# Patient Record
Sex: Female | Born: 1950 | Race: White | Hispanic: No | Smoking: Never smoker
Health system: Southern US, Community
[De-identification: ages and names within clinical notes are randomized; demographics above are authoritative.]

## PROBLEM LIST (undated history)

## (undated) DIAGNOSIS — T7840XA Allergy, unspecified, initial encounter: Secondary | ICD-10-CM

## (undated) DIAGNOSIS — E119 Type 2 diabetes mellitus without complications: Secondary | ICD-10-CM

## (undated) DIAGNOSIS — K219 Gastro-esophageal reflux disease without esophagitis: Secondary | ICD-10-CM

## (undated) DIAGNOSIS — F329 Major depressive disorder, single episode, unspecified: Secondary | ICD-10-CM

## (undated) DIAGNOSIS — G4730 Sleep apnea, unspecified: Secondary | ICD-10-CM

## (undated) DIAGNOSIS — E785 Hyperlipidemia, unspecified: Secondary | ICD-10-CM

## (undated) DIAGNOSIS — I10 Essential (primary) hypertension: Secondary | ICD-10-CM

## (undated) DIAGNOSIS — M1990 Unspecified osteoarthritis, unspecified site: Secondary | ICD-10-CM

## (undated) DIAGNOSIS — F32A Depression, unspecified: Secondary | ICD-10-CM

## (undated) DIAGNOSIS — Z86010 Personal history of colonic polyps: Secondary | ICD-10-CM

## (undated) HISTORY — PX: ABDOMINAL HYSTERECTOMY: SHX81

## (undated) HISTORY — DX: Essential (primary) hypertension: I10

## (undated) HISTORY — PX: COLONOSCOPY: SHX174

## (undated) HISTORY — DX: Personal history of colonic polyps: Z86.010

## (undated) HISTORY — PX: OTHER SURGICAL HISTORY: SHX169

## (undated) HISTORY — DX: Gastro-esophageal reflux disease without esophagitis: K21.9

## (undated) HISTORY — PX: TUBAL LIGATION: SHX77

## (undated) HISTORY — DX: Allergy, unspecified, initial encounter: T78.40XA

## (undated) HISTORY — DX: Depression, unspecified: F32.A

## (undated) HISTORY — DX: Type 2 diabetes mellitus without complications: E11.9

## (undated) HISTORY — DX: Unspecified osteoarthritis, unspecified site: M19.90

## (undated) HISTORY — DX: Hyperlipidemia, unspecified: E78.5

## (undated) HISTORY — DX: Sleep apnea, unspecified: G47.30

## (undated) HISTORY — PX: POLYPECTOMY: SHX149

## (undated) HISTORY — DX: Major depressive disorder, single episode, unspecified: F32.9

---

## 2005-04-18 ENCOUNTER — Ambulatory Visit: Payer: Self-pay | Admitting: Internal Medicine

## 2005-05-02 ENCOUNTER — Ambulatory Visit: Payer: Self-pay | Admitting: Internal Medicine

## 2007-11-09 ENCOUNTER — Ambulatory Visit: Payer: Self-pay | Admitting: Family Medicine

## 2010-04-18 ENCOUNTER — Encounter (INDEPENDENT_AMBULATORY_CARE_PROVIDER_SITE_OTHER): Payer: Self-pay | Admitting: *Deleted

## 2010-04-19 ENCOUNTER — Ambulatory Visit: Payer: Self-pay | Admitting: Internal Medicine

## 2010-05-03 ENCOUNTER — Ambulatory Visit: Payer: Self-pay | Admitting: Internal Medicine

## 2010-05-16 ENCOUNTER — Encounter: Payer: Self-pay | Admitting: Internal Medicine

## 2010-05-24 ENCOUNTER — Encounter: Payer: Self-pay | Admitting: Internal Medicine

## 2011-01-22 NOTE — Procedures (Signed)
Summary: Colonoscopy  Patient: Barbara Fitzpatrick Note: All result statuses are Final unless otherwise noted.  Tests: (1) Colonoscopy (COL)   COL Colonoscopy           DONE     [facility]     [address]     [place], [place]  [address]           COLONOSCOPY PROCEDURE REPORT           PATIENT:  Barbara, Fitzpatrick  MR#:  [id number]     BIRTHDATE:  [birth year], 58 yrs. old  GENDER:  female     ENDOSCOPIST:  Iva Boop, MD, [facility]           PROCEDURE DATE:  05/03/2010     PROCEDURE:  Colonoscopy with snare polypectomy     ASA CLASS:  Class II     INDICATIONS:  surveillance and high-risk screening, history of     pre-cancerous (adenomatous) colon polyps 4 adenomas (max 1 cm)     04/2003     no polyps 04/2005     MEDICATIONS:   Fentanyl 50 mcg IV, Versed 7 mg           DESCRIPTION OF PROCEDURE:   After the risks benefits and     alternatives of the procedure were thoroughly explained, informed     consent was obtained.  Digital rectal exam was performed and     revealed no rectal masses.   The LB CF-[id number] endoscope     was introduced through the anus and advanced to the cecum, which     was identified by both the appendix and ileocecal valve, without     limitations.  The quality of the prep was good, using MoviPrep.     The instrument was then slowly withdrawn as the colon was fully     examined. Insertion: 4:00 mins withdrawal: 17:39 mins     <<PROCEDUREIMAGES>>           FINDINGS:  Three polyps were found. ascending (5mm), splenic     flexure (7-58mm), rectum (3mm). Smaller polyps were snared without     cautery. Retrieval was successful. The 7-32mm polyp was snared,     then cauterized with monopolar cautery. Retrieval was successful.     This polyp was initially cold snared then tip cautery applied to     coagulate bleeeding.  Mild diverticulosis was found in the sigmoid     colon.  This was otherwise a normal examination of the colon.     Retroflexed views in  the rectum revealed internal and external     hemorrhoids.    The scope was then withdrawn from the patient and     the procedure completed.           COMPLICATIONS:  None     ENDOSCOPIC IMPRESSION:     1) Three polyps removed (max size 7-32mm)     2) Mild diverticulosis in the sigmoid colon     3) Otherwise normal examination ofthe colon, good prep     4) Internal and external hemorrhoids     5) Prior adenomas (4, max size 1cm) 04/2003           RECOMMENDATIONS:     1) No aspirin or NSAID's for 2 weeks     2) She is describing what sounds like prolapsing hemorrhoids.     Will make a surgical referral from our office.  REPEAT EXAM:  In for Colonoscopy, pending biopsy results.           Iva Boop, MD, [facility]           CC:  Vonita Moss, MD and The Patient           n.     eSIGNED:   Iva Boop at 05/03/2010 10:55 AM           [facility], [phone]  Note: An exclamation mark (!) indicates a result that was not dispersed into the flowsheet. Document Creation Date: 05/03/2010 10:55 AM _______________________________________________________________________  (1) Order result status: Final Collection or observation date-time: 05/03/2010 10:44 Requested date-time:  Receipt date-time:  Reported date-time:  Referring Physician:   Ordering Physician: Stan Head [id number]) Specimen Source:  Source: Launa Grill Order Number: [id number] Lab site:   Appended Document: Colonoscopy Referral information sent to [facility].  I will await a return call .     I have left a message for the patient with the details of appointment at [facility] with Dr Zachery Dakins 05/16/10 3:10 arrival for 3:40 appointment Darcey Nora RN, [address] [facility]  May 04, 2010 8:20 AM     Clinical Lists Changes  Problems: Added new problem of HEMORRHOIDS, INTERNAL (ICD-455.0) - Signed Orders: Added new Test order of [facility] ([facility]) - Signed      Appended Document:  Colonoscopy     Procedures Next Due Date:    Colonoscopy: 04/2013

## 2011-01-22 NOTE — Letter (Signed)
Summary: Patient Notice- Polyp Results  [facility]  [address] [place], [place] [address]   Phone: [phone]  Fax: [phone]        May 24, 2010 MRN: [id number]    [facility] [address] [place], [place]  [address]    Dear Ms. Reny,  The polyps removed from your colon were adenomatous. This means that they were pre-cancerous or that  they had the potential to change into cancer over time.   I recommend that you have a repeat colonoscopy in 3 years to determine if you have developed any new polyps over time. If you develop any new rectal bleeding, abdominal pain or significant bowel habit changes, please contact us before then.  Please call us if you are having persistent problems or have questions about your condition that have not been fully answered at this time.   Sincerely,  Iva Boop MD, [facility]  This letter has been electronically signed by your physician.  Appended Document: Patient Notice- Polyp Results letter mailed.

## 2011-01-22 NOTE — Miscellaneous (Signed)
Summary: Lec previsit  Clinical Lists Changes  Medications: Added new medication of MOVIPREP 100 GM  SOLR (PEG-KCL-NACL-NASULF-NA ASC-C) As per prep instructions. - Signed Rx of MOVIPREP 100 GM  SOLR (PEG-KCL-NACL-NASULF-NA ASC-C) As per prep instructions.;  #1 x 0;  Signed;  Entered by: Karl Bales RN;  Authorized by: Iva Boop MD, [facility];  Method used: Electronically to [facility] [phone]*, [address] [place], [place]  [address], Ph: [phone], Fax: [phone] Observations: Added new observation of NKA: T (04/19/2010 16:03)    Prescriptions: MOVIPREP 100 GM  SOLR (PEG-KCL-NACL-NASULF-NA ASC-C) As per prep instructions.  #1 x 0   Entered by:   Karl Bales RN   Authorized by:   Iva Boop MD, [facility]   Signed by:   Karl Bales RN on 04/19/2010   Method used:   Electronically to        [facility] [phone]* (retail)       [address] [place], [place]  [address]       Ph: [phone]       Fax: [phone]   RxID:   [id number]

## 2011-01-22 NOTE — Letter (Signed)
Summary: [facility] Instructions  [facility]  [address] [place], [place] [address]   Phone: [phone]  Fax: [phone]       Barbara Fitzpatrick    1951/12/21    MRN: [id number]        Procedure Day /Date: Thursday 05/03/2010     Arrival Time: 9:00 am      Procedure Time: 10:00 am     Location of Procedure:                    _x _  [facility] (4th Floor)                        PREPARATION FOR COLONOSCOPY WITH MOVIPREP   Starting 5 days prior to your procedure Saturday 5/7 do not eat nuts, seeds, popcorn, corn, beans, peas,  salads, or any raw vegetables.  Do not take any fiber supplements (e.g. Metamucil, Citrucel, and Benefiber).  THE DAY BEFORE YOUR PROCEDURE         DATE: Wednesday 5/11  1.  Drink clear liquids the entire day-NO SOLID FOOD  2.  Do not drink anything colored red or purple.  Avoid juices with pulp.  No orange juice.  3.  Drink at least 64 oz. (8 glasses) of fluid/clear liquids during the day to prevent dehydration and help the prep work efficiently.  CLEAR LIQUIDS INCLUDE: Water Jello Ice Popsicles Tea (sugar ok, no milk/cream) Powdered fruit flavored drinks Coffee (sugar ok, no milk/cream) Gatorade Juice: apple, white grape, white cranberry  Lemonade Clear bullion, consomm, broth Carbonated beverages (any kind) Strained chicken noodle soup Hard Candy                             4.  In the morning, mix first dose of MoviPrep solution:    Empty 1 Pouch A and 1 Pouch B into the disposable container    Add lukewarm drinking water to the top line of the container. Mix to dissolve    Refrigerate (mixed solution should be used within 24 hrs)  5.  Begin drinking the prep at 5:00 p.m. The MoviPrep container is divided by 4 marks.   Every 15 minutes drink the solution down to the next mark (approximately 8 oz) until the full liter is complete.   6.  Follow completed prep with 16 oz of clear liquid of your choice  (Nothing red or purple).  Continue to drink clear liquids until bedtime.  7.  Before going to bed, mix second dose of MoviPrep solution:    Empty 1 Pouch A and 1 Pouch B into the disposable container    Add lukewarm drinking water to the top line of the container. Mix to dissolve    Refrigerate  THE DAY OF YOUR PROCEDURE      DATE: Thursday 5/12  Beginning at 5:00 a.m. (5 hours before procedure):         1. Every 15 minutes, drink the solution down to the next mark (approx 8 oz) until the full liter is complete.  2. Follow completed prep with 16 oz. of clear liquid of your choice.    3. You may drink clear liquids until 8:00 am (2 HOURS BEFORE PROCEDURE).   MEDICATION INSTRUCTIONS  Unless otherwise instructed, you should take regular prescription medications with a small sip of water   as early as possible the morning of your  procedure.  Hold Hydrochlorothiazide the day of your procedure only (before coming in for procedure)         OTHER INSTRUCTIONS  You will need a responsible adult at least 59 years of age to accompany you and drive you home.   This person must remain in the waiting room during your procedure.  Wear loose fitting clothing that is easily removed.  Leave jewelry and other valuables at home.  However, you may wish to bring a book to read or  an iPod/MP3 player to listen to music as you wait for your procedure to start.  Remove all body piercing jewelry and leave at home.  Total time from sign-in until discharge is approximately 2-3 hours.  You should go home directly after your procedure and rest.  You can resume normal activities the  day after your procedure.  The day of your procedure you should not:   Drive   Make legal decisions   Operate machinery   Drink alcohol   Return to work  You will receive specific instructions about eating, activities and medications before you leave.    The above instructions have been reviewed and  explained to me by   Karl Bales RN  April 19, 2010 4:29 PM    I fully understand and can verbalize these instructions _____________________________ Date _________

## 2011-01-22 NOTE — Letter (Signed)
Summary: [facility]   Imported By: Lester [place] 06/06/2010 09:16:15  _____________________________________________________________________  External Attachment:    Type:   Image     Comment:   External Document

## 2012-12-02 ENCOUNTER — Ambulatory Visit: Payer: Self-pay

## 2013-06-16 ENCOUNTER — Encounter: Payer: Self-pay | Admitting: Internal Medicine

## 2013-06-16 DIAGNOSIS — Z860100 Personal history of colon polyps, unspecified: Secondary | ICD-10-CM | POA: Insufficient documentation

## 2013-06-16 HISTORY — DX: Personal history of colonic polyps: Z86.010

## 2013-06-16 HISTORY — DX: Personal history of colon polyps, unspecified: Z86.0100

## 2013-06-17 ENCOUNTER — Encounter: Payer: Self-pay | Admitting: Internal Medicine

## 2013-07-07 ENCOUNTER — Encounter: Payer: Self-pay | Admitting: Internal Medicine

## 2013-09-10 ENCOUNTER — Ambulatory Visit (AMBULATORY_SURGERY_CENTER): Payer: BC Managed Care – PPO | Admitting: *Deleted

## 2013-09-10 VITALS — Ht 64.5 in | Wt 204.8 lb

## 2013-09-10 DIAGNOSIS — Z86010 Personal history of colonic polyps: Secondary | ICD-10-CM

## 2013-09-10 MED ORDER — NA SULFATE-K SULFATE-MG SULF 17.5-3.13-1.6 GM/177ML PO SOLN: ORAL | Status: DC

## 2013-09-10 NOTE — Progress Notes (Signed)
No soy or egg allergy 

## 2013-09-13 ENCOUNTER — Encounter: Payer: Self-pay | Admitting: Internal Medicine

## 2013-09-24 ENCOUNTER — Ambulatory Visit (AMBULATORY_SURGERY_CENTER): Payer: BC Managed Care – PPO | Admitting: Internal Medicine

## 2013-09-24 ENCOUNTER — Encounter: Payer: Self-pay | Admitting: Internal Medicine

## 2013-09-24 VITALS — BP 117/73 | HR 61 | Temp 97.4°F | Resp 15 | Ht 64.5 in | Wt 204.0 lb

## 2013-09-24 DIAGNOSIS — K5730 Diverticulosis of large intestine without perforation or abscess without bleeding: Secondary | ICD-10-CM

## 2013-09-24 DIAGNOSIS — K648 Other hemorrhoids: Secondary | ICD-10-CM

## 2013-09-24 DIAGNOSIS — Z86010 Personal history of colonic polyps: Secondary | ICD-10-CM

## 2013-09-24 DIAGNOSIS — D126 Benign neoplasm of colon, unspecified: Secondary | ICD-10-CM

## 2013-09-24 MED ORDER — SODIUM CHLORIDE 0.9 % IV SOLN: 500.0000 mL | INTRAVENOUS | Status: DC

## 2013-09-24 NOTE — Op Note (Signed)
[facility] [address] [place], [address]   COLONOSCOPY PROCEDURE REPORT  PATIENT: Barbara, Fitzpatrick  MR#: [id number] BIRTHDATE: [birth year] , 62  yrs. old GENDER: Female ENDOSCOPIST: Iva Boop, MD, [facility] PROCEDURE DATE:  09/24/2013 PROCEDURE:   Colonoscopy with biopsy and snare polypectomy First Screening Colonoscopy - Avg.  risk and is 50 yrs.  old or older - No.  Prior Negative Screening - Now for repeat screening. N/A  History of Adenoma - Now for follow-up colonoscopy & has been > or = to 3 yrs.  Yes hx of adenoma.  Has been 3 or more years since last colonoscopy.  Polyps Removed Today? Yes. ASA CLASS:   Class II INDICATIONS:Patient's personal history of adenomatous colon polyps.  MEDICATIONS: propofol (Diprivan) 350mg  IV, MAC sedation, administered by CRNA, and These medications were titrated to patient response per physician's verbal order  DESCRIPTION OF PROCEDURE:   After the risks benefits and alternatives of the procedure were thoroughly explained, informed consent was obtained.  A digital rectal exam revealed no abnormalities of the rectum.   The LB PFC-H190 [id number]  endoscope was introduced through the anus and advanced to the cecum, which was identified by both the appendix and ileocecal valve. No adverse events experienced.   The quality of the prep was Suprep adequate The instrument was then slowly withdrawn as the colon was fully examined.    COLON FINDINGS: Three sessile polyps measuring 2-8 mm in size were found in the ascending colon and at the hepatic flexure.  A polypectomy was performed with cold forceps (2mm polyp) and with a cold snare (other 2).  The resection was complete and the polyp tissue was completely retrieved.   Mild diverticulosis was noted in the sigmoid colon.   The colon mucosa was otherwise normal. Retroflexed views revealed internal hemorrhoids. The time to cecum=2 minutes 54 seconds.  Withdrawal time=16 minutes  37 seconds. The scope was withdrawn and the procedure completed. COMPLICATIONS: There were no complications.  ENDOSCOPIC IMPRESSION: 1.   Three sessile polyps measuring 2-8 mm in size were found in the ascending colon and at the hepatic flexure; polypectomy was performed with cold forceps and with a cold snare 2.   Mild diverticulosis was noted in the sigmoid colon and internal hemorrhoids in the rectum 3.   The colon mucosa was otherwise normal - adequate pre - patient with prior adenomas last in 2011  RECOMMENDATIONS: Timing of repeat colonoscopy will be determined by pathology findings.   eSigned:  Iva Boop, MD, [facility] 09/24/2013 11:32 AM   cc: The Patient  and Charlean Sanfilippo

## 2013-09-24 NOTE — Patient Instructions (Addendum)
I found and removed 3 polyps today. As before they look benign but I suspect they are pre-cancerous. You also have internal hemorrhoids and mild diverticulosis.  I will let you know pathology results and when to have another routine colonoscopy by mail.  If you have hemorrhoid problems (swelling, itching, bleeding) I am able to treat those with an in-office procedure. If you like, please call my office at [phone] to schedule an appointment and I can evaluate you further.  I appreciate the opportunity to care for you. Iva Boop, MD, FACG  YOU HAD AN ENDOSCOPIC PROCEDURE TODAY AT THE [facility]: Refer to the procedure report that was given to you for any specific questions about what was found during the examination.  If the procedure report does not answer your questions, please call your gastroenterologist to clarify.  If you requested that your care partner not be given the details of your procedure findings, then the procedure report has been included in a sealed envelope for you to review at your convenience later.  YOU SHOULD EXPECT: Some feelings of bloating in the abdomen. Passage of more gas than usual.  Walking can help get rid of the air that was put into your GI tract during the procedure and reduce the bloating. If you had a lower endoscopy (such as a colonoscopy or flexible sigmoidoscopy) you may notice spotting of blood in your stool or on the toilet paper. If you underwent a bowel prep for your procedure, then you may not have a normal bowel movement for a few days.  DIET: Your first meal following the procedure should be a light meal and then it is ok to progress to your normal diet.  A half-sandwich or bowl of soup is an example of a good first meal.  Heavy or fried foods are harder to digest and may make you feel nauseous or bloated.  Likewise meals heavy in dairy and vegetables can cause extra gas to form and this can also increase the bloating.  Drink plenty of  fluids but you should avoid alcoholic beverages for 24 hours.  ACTIVITY: Your care partner should take you home directly after the procedure.  You should plan to take it easy, moving slowly for the rest of the day.  You can resume normal activity the day after the procedure however you should NOT DRIVE or use heavy machinery for 24 hours (because of the sedation medicines used during the test).    SYMPTOMS TO REPORT IMMEDIATELY: A gastroenterologist can be reached at any hour.  During normal business hours, 8:30 AM to 5:00 PM Monday through Friday, call [phone].  After hours and on weekends, please call the GI answering service at [phone] who will take a message and have the physician on call contact you.   Following lower endoscopy (colonoscopy or flexible sigmoidoscopy):  Excessive amounts of blood in the stool  Significant tenderness or worsening of abdominal pains  Swelling of the abdomen that is new, acute  Fever of 100F or higher  FOLLOW UP: If any biopsies were taken you will be contacted by phone or by letter within the next 1-3 weeks.  Call your gastroenterologist if you have not heard about the biopsies in 3 weeks.  Our staff will call the home number listed on your records the next business day following your procedure to check on you and address any questions or concerns that you may have at that time regarding the information given to you  following your procedure. This is a courtesy call and so if there is no answer at the home number and we have not heard from you through the emergency physician on call, we will assume that you have returned to your regular daily activities without incident.  SIGNATURES/CONFIDENTIALITY: You and/or your care partner have signed paperwork which will be entered into your electronic medical record.  These signatures attest to the fact that that the information above on your After Visit Summary has been reviewed and is understood.  Full  responsibility of the confidentiality of this discharge information lies with you and/or your care-partner.

## 2013-09-24 NOTE — Progress Notes (Signed)
Patient did not have preoperative order for IV antibiotic SSI prophylaxis. (G8918)  Patient did not experience any of the following events: a burn prior to discharge; a fall within the facility; wrong site/side/patient/procedure/implant event; or a hospital transfer or hospital admission upon discharge from the facility. (G8907)  

## 2013-09-24 NOTE — Progress Notes (Signed)
Report to pacu rn, vss, bbs=clear 

## 2013-09-24 NOTE — Progress Notes (Signed)
Called to room to assist during endoscopic procedure.  Patient ID and intended procedure confirmed with present staff. Received instructions for my participation in the procedure from the performing physician.  

## 2013-09-27 ENCOUNTER — Telehealth: Payer: Self-pay | Admitting: *Deleted

## 2013-09-27 NOTE — Telephone Encounter (Signed)
  Follow up Call-  Call back number 09/24/2013  Post procedure Call Back phone  # [phone] or [phone]   Permission to leave phone message Yes     Patient questions:  Do you have a fever, pain , or abdominal swelling? no Pain Score  0 *  Have you tolerated food without any problems? yes  Have you been able to return to your normal activities? yes  Do you have any questions about your discharge instructions: Diet   no Medications  no Follow up visit  no  Do you have questions or concerns about your Care? no  Actions: * If pain score is 4 or above: No action needed, pain <4.

## 2013-09-28 ENCOUNTER — Encounter: Payer: Self-pay | Admitting: Internal Medicine

## 2013-09-28 NOTE — Progress Notes (Signed)
Quick Note:  3 adenomas max 8 mm Repeat colonoscopy 3 years - 2017 ______

## 2014-03-30 ENCOUNTER — Ambulatory Visit: Payer: Self-pay

## 2015-04-03 ENCOUNTER — Ambulatory Visit
Admit: 2015-04-03 | Disposition: A | Discharge status: Home / Self Care | Payer: Self-pay | Attending: Unknown Physician Specialty | Admitting: Unknown Physician Specialty

## 2015-06-07 ENCOUNTER — Telehealth: Payer: Self-pay | Admitting: Unknown Physician Specialty

## 2015-06-07 MED ORDER — HYDROCHLOROTHIAZIDE 25 MG PO TABS: 25.0000 mg | ORAL_TABLET | Freq: Every day | ORAL | Status: DC

## 2015-06-07 NOTE — Telephone Encounter (Signed)
E-Fax came through for refill: Rx: hydrochlorothiazide (HYDRODIURIL) 25 MG tablet  Take 25 mg by mouth daily. Pharmacy: [facility]

## 2015-06-09 ENCOUNTER — Ambulatory Visit (INDEPENDENT_AMBULATORY_CARE_PROVIDER_SITE_OTHER): Payer: BC Managed Care – PPO | Admitting: Unknown Physician Specialty

## 2015-06-09 ENCOUNTER — Encounter: Payer: Self-pay | Admitting: Unknown Physician Specialty

## 2015-06-09 ENCOUNTER — Telehealth: Payer: Self-pay | Admitting: Unknown Physician Specialty

## 2015-06-09 VITALS — BP 143/86 | HR 97 | Temp 99.5°F | Ht 63.8 in | Wt 197.6 lb

## 2015-06-09 DIAGNOSIS — J069 Acute upper respiratory infection, unspecified: Secondary | ICD-10-CM | POA: Diagnosis not present

## 2015-06-09 NOTE — Progress Notes (Signed)
   BP 143/86 mmHg  Pulse 97  Temp(Src) 99.5 F (37.5 C)  Ht 5' 3.8" (1.621 m)  Wt 197 lb 9.6 oz (89.631 kg)  BMI 34.11 kg/m2  SpO2 97%  LMP  (LMP Unknown)   Subjective:    Patient ID: Barbara Fitzpatrick, female    DOB: August 12, 1951, 64 y.o.   MRN: [id number]  HPI: Barbara Fitzpatrick is a 64 y.o. female  Chief Complaint  Patient presents with  . Sore Throat    pt states mainly sore on right side  . Nasal Congestion  . Facial Pain   Sore Throat  Associated symptoms include congestion, headaches and swollen glands. Pertinent negatives include no abdominal pain, coughing, diarrhea or ear pain.  URI  This is a new problem. The current episode started yesterday. The problem has been unchanged. There has been no fever. Associated symptoms include congestion, headaches, rhinorrhea, sinus pain, a sore throat and swollen glands. Pertinent negatives include no abdominal pain, chest pain, coughing, diarrhea, dysuria, ear pain, joint pain, joint swelling or nausea. She has tried nothing for the symptoms.    Relevant past medical, surgical, family and social history reviewed and updated as indicated. Interim medical history since our last visit reviewed. Allergies and medications reviewed and updated.  Review of Systems  HENT: Positive for congestion, rhinorrhea and sore throat. Negative for ear pain.   Respiratory: Negative for cough.   Cardiovascular: Negative for chest pain.  Gastrointestinal: Negative for nausea, abdominal pain and diarrhea.  Genitourinary: Negative for dysuria.  Musculoskeletal: Negative for joint pain.  Neurological: Positive for headaches.    Per HPI unless specifically indicated above     Objective:    BP 143/86 mmHg  Pulse 97  Temp(Src) 99.5 F (37.5 C)  Ht 5' 3.8" (1.621 m)  Wt 197 lb 9.6 oz (89.631 kg)  BMI 34.11 kg/m2  SpO2 97%  LMP  (LMP Unknown)  Wt Readings from Last 3 Encounters:  06/09/15 197 lb 9.6 oz (89.631 kg)  09/24/13 204 lb (92.534 kg)   09/10/13 204 lb 12.8 oz (92.897 kg)    Physical Exam  Constitutional: She is oriented to person, place, and time. She appears well-developed and well-nourished. No distress.  HENT:  Head: Normocephalic and atraumatic.  Mouth/Throat: Posterior oropharyngeal edema and posterior oropharyngeal erythema present.  Eyes: Conjunctivae and lids are normal. Right eye exhibits no discharge. Left eye exhibits no discharge. No scleral icterus.  Cardiovascular: Normal rate and regular rhythm.   Pulmonary/Chest: Effort normal. No respiratory distress.  Abdominal: Normal appearance. There is no splenomegaly or hepatomegaly.  Musculoskeletal: Normal range of motion.  Neurological: She is alert and oriented to person, place, and time.  Skin: Skin is intact. No rash noted. No pallor.  Psychiatric: She has a normal mood and affect. Her behavior is normal. Judgment and thought content normal.    No results found for this or any previous visit.    Assessment & Plan:   Problem List Items Addressed This Visit    None    Visit Diagnoses    Upper respiratory infection    -  Primary    Supportive care.  Tyleonol/Salt water gargles for sore throat.  Rest and fluids        Follow up plan: Return if symptoms worsen or fail to improve.

## 2015-06-09 NOTE — Telephone Encounter (Signed)
Pt has been added to Cheryl's schedule for this afternoon for a sick visit. Thanks.

## 2015-06-09 NOTE — Patient Instructions (Signed)
Upper Respiratory Infection, Adult An upper respiratory infection (URI) is also sometimes known as the common cold. The upper respiratory tract includes the nose, sinuses, throat, trachea, and bronchi. Bronchi are the airways leading to the lungs. Most people improve within 1 week, but symptoms can last up to 2 weeks. A residual cough may last even longer.  CAUSES Many different viruses can infect the tissues lining the upper respiratory tract. The tissues become irritated and inflamed and often become very moist. Mucus production is also common. A cold is contagious. You can easily spread the virus to others by oral contact. This includes kissing, sharing a glass, coughing, or sneezing. Touching your mouth or nose and then touching a surface, which is then touched by another person, can also spread the virus. SYMPTOMS  Symptoms typically develop 1 to 3 days after you come in contact with a cold virus. Symptoms vary from person to person. They may include:  Runny nose.  Sneezing.  Nasal congestion.  Sinus irritation.  Sore throat.  Loss of voice (laryngitis).  Cough.  Fatigue.  Muscle aches.  Loss of appetite.  Headache.  Low-grade fever. DIAGNOSIS  You might diagnose your own cold based on familiar symptoms, since most people get a cold 2 to 3 times a year. Your caregiver can confirm this based on your exam. Most importantly, your caregiver can check that your symptoms are not due to another disease such as strep throat, sinusitis, pneumonia, asthma, or epiglottitis. Blood tests, throat tests, and X-rays are not necessary to diagnose a common cold, but they may sometimes be helpful in excluding other more serious diseases. Your caregiver will decide if any further tests are required. RISKS AND COMPLICATIONS  You may be at risk for a more severe case of the common cold if you smoke cigarettes, have chronic heart disease (such as heart failure) or lung disease (such as asthma), or if  you have a weakened immune system. The very young and very old are also at risk for more serious infections. Bacterial sinusitis, middle ear infections, and bacterial pneumonia can complicate the common cold. The common cold can worsen asthma and chronic obstructive pulmonary disease (COPD). Sometimes, these complications can require emergency medical care and may be life-threatening. PREVENTION  The best way to protect against getting a cold is to practice good hygiene. Avoid oral or hand contact with people with cold symptoms. Wash your hands often if contact occurs. There is no clear evidence that vitamin C, vitamin E, echinacea, or exercise reduces the chance of developing a cold. However, it is always recommended to get plenty of rest and practice good nutrition. TREATMENT  Treatment is directed at relieving symptoms. There is no cure. Antibiotics are not effective, because the infection is caused by a virus, not by bacteria. Treatment may include:  Increased fluid intake. Sports drinks offer valuable electrolytes, sugars, and fluids.  Breathing heated mist or steam (vaporizer or shower).  Eating chicken soup or other clear broths, and maintaining good nutrition.  Getting plenty of rest.  Using gargles or lozenges for comfort.  Controlling fevers with ibuprofen or acetaminophen as directed by your caregiver.  Increasing usage of your inhaler if you have asthma. Zinc gel and zinc lozenges, taken in the first 24 hours of the common cold, can shorten the duration and lessen the severity of symptoms. Pain medicines may help with fever, muscle aches, and throat pain. A variety of non-prescription medicines are available to treat congestion and runny nose. Your caregiver   can make recommendations and may suggest nasal or lung inhalers for other symptoms.  HOME CARE INSTRUCTIONS   Only take over-the-counter or prescription medicines for pain, discomfort, or fever as directed by your  caregiver.  Use a warm mist humidifier or inhale steam from a shower to increase air moisture. This may keep secretions moist and make it easier to breathe.  Drink enough water and fluids to keep your urine clear or pale yellow.  Rest as needed.  Return to work when your temperature has returned to normal or as your caregiver advises. You may need to stay home longer to avoid infecting others. You can also use a face mask and careful hand washing to prevent spread of the virus. SEEK MEDICAL CARE IF:   After the first few days, you feel you are getting worse rather than better.  You need your caregiver's advice about medicines to control symptoms.  You develop chills, worsening shortness of breath, or brown or red sputum. These may be signs of pneumonia.  You develop yellow or brown nasal discharge or pain in the face, especially when you bend forward. These may be signs of sinusitis.  You develop a fever, swollen neck glands, pain with swallowing, or white areas in the back of your throat. These may be signs of strep throat. SEEK IMMEDIATE MEDICAL CARE IF:   You have a fever.  You develop severe or persistent headache, ear pain, sinus pain, or chest pain.  You develop wheezing, a prolonged cough, cough up blood, or have a change in your usual mucus (if you have chronic lung disease).  You develop sore muscles or a stiff neck. Document Released: 06/04/2001 Document Revised: 03/02/2012 Document Reviewed: 03/16/2014 [employer] Patient Information 2015 [employer]. This information is not intended to replace advice given to you by your health care provider. Make sure you discuss any questions you have with your health care provider.  

## 2015-09-15 ENCOUNTER — Ambulatory Visit (INDEPENDENT_AMBULATORY_CARE_PROVIDER_SITE_OTHER): Payer: BC Managed Care – PPO | Admitting: Unknown Physician Specialty

## 2015-09-15 ENCOUNTER — Encounter: Payer: Self-pay | Admitting: Unknown Physician Specialty

## 2015-09-15 VITALS — BP 133/85 | HR 71 | Temp 98.1°F | Ht 63.7 in | Wt 202.2 lb

## 2015-09-15 DIAGNOSIS — E785 Hyperlipidemia, unspecified: Secondary | ICD-10-CM | POA: Diagnosis not present

## 2015-09-15 DIAGNOSIS — R5383 Other fatigue: Secondary | ICD-10-CM | POA: Diagnosis not present

## 2015-09-15 DIAGNOSIS — G4700 Insomnia, unspecified: Secondary | ICD-10-CM

## 2015-09-15 DIAGNOSIS — I10 Essential (primary) hypertension: Secondary | ICD-10-CM | POA: Diagnosis not present

## 2015-09-15 DIAGNOSIS — E1169 Type 2 diabetes mellitus with other specified complication: Secondary | ICD-10-CM | POA: Insufficient documentation

## 2015-09-15 DIAGNOSIS — R7303 Prediabetes: Secondary | ICD-10-CM | POA: Insufficient documentation

## 2015-09-15 DIAGNOSIS — S43421A Sprain of right rotator cuff capsule, initial encounter: Secondary | ICD-10-CM | POA: Diagnosis not present

## 2015-09-15 DIAGNOSIS — R7301 Impaired fasting glucose: Secondary | ICD-10-CM | POA: Diagnosis not present

## 2015-09-15 DIAGNOSIS — Z23 Encounter for immunization: Secondary | ICD-10-CM | POA: Diagnosis not present

## 2015-09-15 DIAGNOSIS — E1159 Type 2 diabetes mellitus with other circulatory complications: Secondary | ICD-10-CM | POA: Insufficient documentation

## 2015-09-15 DIAGNOSIS — I152 Hypertension secondary to endocrine disorders: Secondary | ICD-10-CM | POA: Insufficient documentation

## 2015-09-15 DIAGNOSIS — F5101 Primary insomnia: Secondary | ICD-10-CM | POA: Insufficient documentation

## 2015-09-15 DIAGNOSIS — Z0000 Encounter for general adult medical examination without abnormal findings: Secondary | ICD-10-CM

## 2015-09-15 LAB — MICROALBUMIN, URINE WAIVED
Creatinine, Urine Waived: 100 mg/dL (ref 10–300)
Microalb, Ur Waived: 10 mg/L (ref 0–19)
Microalb/Creat Ratio: <30 mg/g (ref <30)

## 2015-09-15 LAB — BAYER DCA HB A1C WAIVED: HB A1C (BAYER DCA - WAIVED): 5.7 % (ref <7.0)

## 2015-09-15 NOTE — Assessment & Plan Note (Signed)
Check Lipid panel 

## 2015-09-15 NOTE — Assessment & Plan Note (Signed)
Check hgb A1C

## 2015-09-15 NOTE — Assessment & Plan Note (Signed)
Pt ed on sleep

## 2015-09-15 NOTE — Patient Instructions (Addendum)
Insomnia Insomnia is frequent trouble falling and/or staying asleep. Insomnia can be a long term problem or a short term problem. Both are common. Insomnia can be a short term problem when the wakefulness is related to a certain stress or worry. Long term insomnia is often related to ongoing stress during waking hours and/or poor sleeping habits. Overtime, sleep deprivation itself can make the problem worse. Every little thing feels more severe because you are overtired and your ability to cope is decreased. CAUSES   Stress, anxiety, and depression.  Poor sleeping habits.  Distractions such as TV in the bedroom.  Naps close to bedtime.  Engaging in emotionally charged conversations before bed.  Technical reading before sleep.  Alcohol and other sedatives. They may make the problem worse. They can hurt normal sleep patterns and normal dream activity.  Stimulants such as caffeine for several hours prior to bedtime.  Pain syndromes and shortness of breath can cause insomnia.  Exercise late at night.  Changing time zones may cause sleeping problems (jet lag). It is sometimes helpful to have someone observe your sleeping patterns. They should look for periods of not breathing during the night (sleep apnea). They should also look to see how long those periods last. If you live alone or observers are uncertain, you can also be observed at a sleep clinic where your sleep patterns will be professionally monitored. Sleep apnea requires a checkup and treatment. Give your caregivers your medical history. Give your caregivers observations your family has made about your sleep.  SYMPTOMS   Not feeling rested in the morning.  Anxiety and restlessness at bedtime.  Difficulty falling and staying asleep. TREATMENT   Your caregiver may prescribe treatment for an underlying medical disorders. Your caregiver can give advice or help if you are using alcohol or other drugs for self-medication. Treatment  of underlying problems will usually eliminate insomnia problems.  Medications can be prescribed for short time use. They are generally not recommended for lengthy use.  Over-the-counter sleep medicines are not recommended for lengthy use. They can be habit forming.  You can promote easier sleeping by making lifestyle changes such as:  Using relaxation techniques that help with breathing and reduce muscle tension.  Exercising earlier in the day.  Changing your diet and the time of your last meal. No night time snacks.  Establish a regular time to go to bed.  Counseling can help with stressful problems and worry.  Soothing music and white noise may be helpful if there are background noises you cannot remove.  Stop tedious detailed work at least one hour before bedtime. HOME CARE INSTRUCTIONS   Keep a diary. Inform your caregiver about your progress. This includes any medication side effects. See your caregiver regularly. Take note of:  Times when you are asleep.  Times when you are awake during the night.  The quality of your sleep.  How you feel the next day. This information will help your caregiver care for you.  Get out of bed if you are still awake after 15 minutes. Read or do some quiet activity. Keep the lights down. Wait until you feel sleepy and go back to bed.  Keep regular sleeping and waking hours. Avoid naps.  Exercise regularly.  Avoid distractions at bedtime. Distractions include watching television or engaging in any intense or detailed activity like attempting to balance the household checkbook.  Develop a bedtime ritual. Keep a familiar routine of bathing, brushing your teeth, climbing into bed at the same   time each night, listening to soothing music. Routines increase the success of falling to sleep faster.  Use relaxation techniques. This can be using breathing and muscle tension release routines. It can also include visualizing peaceful scenes. You can  also help control troubling or intruding thoughts by keeping your mind occupied with boring or repetitive thoughts like the old concept of counting sheep. You can make it more creative like imagining planting one beautiful flower after another in your backyard garden.  During your day, work to eliminate stress. When this is not possible use some of the previous suggestions to help reduce the anxiety that accompanies stressful situations. MAKE SURE YOU:   Understand these instructions.  Will watch your condition.  Will get help right away if you are not doing well or get worse. Document Released: 12/06/2000 Document Revised: 03/02/2012 Document Reviewed: 01/06/2008 [facility] Patient Information 2015 [place], [place]. This information is not intended to replace advice given to you by your health care provider. Make sure you discuss any questions you have with your health care provider.  There are some on line resources that are more helpful than medications.  One is shuti.com and the other is [employer].  Both have been shown to be more helpful than medications.    Also, spend no more time in be than you want to sleep.  If you plan to sleep only 8 hours, don't stay in bed for 10 hours.  Even if you didn't sleep well, force yourself to get up  You can try melatonin about 1 hour before bed.

## 2015-09-15 NOTE — Assessment & Plan Note (Signed)
Stable, continue present medications.   

## 2015-09-15 NOTE — Progress Notes (Signed)
A  BP 133/85 mmHg  Pulse 71  Temp(Src) 98.1 F (36.7 C)  Ht 5' 3.7" (1.618 m)  Wt 202 lb 3.2 oz (91.717 kg)  BMI 35.03 kg/m2  SpO2 95%  LMP  (LMP Unknown)   Subjective:    Patient ID: Barbara Fitzpatrick, female    DOB: 05-25-1951, 64 y.o.   MRN: [id number]  HPI: KHLOEI SPIKER is a 64 y.o. female  Chief Complaint  Patient presents with  . Annual Exam    Relevant past medical, surgical, family and social history reviewed and updated as indicated. Interim medical history since our last visit reviewed. Allergies and medications reviewed and updated.    HYPERTENSION / HYPERLIPIDEMIA Satisfied with current treatment?  no H6  Duration of hypertension:  chronic  H4  BP monitoring frequency:  not checking  H5  BP medication side effects:  no P1 Past BP meds:  none  P1  Duration of hyperlipidemia:  chronic  H4  Cholesterol medication side effects:  no P1  Cholesterol supplements:  none  P1 Past cholesterol medications:  none  P1 Medication compliance:  excellent compliance  P1  Recent stressors:  no  H6   Recurrent headaches:  no  R10 Visual changes:  no  R2  Palpitations:  no  R4  Dyspnea:  no  R5  Chest pain:  no  R4  Lower extremity edema:  no  R4  Dizzy/lightheaded:  no  R10    Review of Systems  Constitutional: Negative.        Stopped Buproprion as she feels it made her want to sleep.   Still finds she has low energy.  She states she is not sleeping well.  She goes to bed 11-12 and gets up 9:30-10.  She wakes up frequently through the night.     HENT: Negative.   Eyes: Negative.   Respiratory: Negative.   Cardiovascular: Negative.   Gastrointestinal:       More problems with burning and gastric reflux that has increased indigestion.  States diet soft drink helps.  Has cut out salt sugar and bread  Endocrine: Negative.   Genitourinary: Negative.   Musculoskeletal: Negative.        Left shoulder pain.    Skin: Negative.   Allergic/Immunologic: Negative.    Neurological: Negative.   Hematological: Negative.   Psychiatric/Behavioral: Negative.     Per HPI unless specifically indicated above     Objective:    BP 133/85 mmHg  Pulse 71  Temp(Src) 98.1 F (36.7 C)  Ht 5' 3.7" (1.618 m)  Wt 202 lb 3.2 oz (91.717 kg)  BMI 35.03 kg/m2  SpO2 95%  LMP  (LMP Unknown)  Wt Readings from Last 3 Encounters:  09/15/15 202 lb 3.2 oz (91.717 kg)  04/10/15 203 lb (92.08 kg)  06/09/15 197 lb 9.6 oz (89.631 kg)    Physical Exam  Constitutional: She is oriented to person, place, and time. She appears well-developed and well-nourished.  HENT:  Head: Normocephalic and atraumatic.  Eyes: Pupils are equal, round, and reactive to light. Right eye exhibits no discharge. Left eye exhibits no discharge. No scleral icterus.  Neck: Normal range of motion. Neck supple. Carotid bruit is not present. No thyromegaly present.  Cardiovascular: Normal rate, regular rhythm and normal heart sounds.  Exam reveals no gallop and no friction rub.   No murmur heard. Pulmonary/Chest: Effort normal and breath sounds normal. No respiratory distress. She has no wheezes. She has no rales.  Abdominal: Soft. Bowel sounds are normal. There is no tenderness. There is no rebound.  Genitourinary: No breast swelling, tenderness or discharge.  Musculoskeletal:       Right shoulder: She exhibits decreased range of motion and tenderness. She exhibits no bony tenderness, no swelling, no effusion and no crepitus.  Positive impinglement signs  Lymphadenopathy:    She has no cervical adenopathy.  Neurological: She is alert and oriented to person, place, and time.  Skin: Skin is warm, dry and intact. No rash noted.  Psychiatric: She has a normal mood and affect. Her speech is normal and behavior is normal. Judgment and thought content normal. Cognition and memory are normal.  Vitals reviewed.   No results found for this or any previous visit.    Assessment & Plan:   Problem List  Items Addressed This Visit      Unprioritized   Impaired fasting glucose    Check hgb A1C      Relevant Orders   Bayer DCA Hb A1c Waived   Benign hypertension    Stable, continue present medications.       Relevant Medications   atorvastatin (LIPITOR) 40 MG tablet   Other Relevant Orders   Microalbumin, Urine Waived   Uric acid   Vitamin B12   Hyperlipemia    Check Lipid panel      Relevant Medications   atorvastatin (LIPITOR) 40 MG tablet   Insomnia    Pt ed on sleep       Other Visit Diagnoses    Immunization due    -  Primary    Relevant Orders    Flu Vaccine QUAD 36+ mos PF IM (Fluarix & Fluzone Quad PF) (Completed)    Other fatigue        check labs including B12 and Vit D    Relevant Orders    CBC with Differential/Platelet    Vit D  25 hydroxy (rtn osteoporosis monitoring)    Rotator cuff (capsule) sprain, right, initial encounter        Refer to PT    Relevant Orders    Ambulatory referral to Physical Therapy    Routine general medical examination at a health care facility        Relevant Orders    CBC with Differential/Platelet    Comprehensive metabolic panel    Hepatitis C antibody    HIV antibody    Lipid Panel w/o Chol/HDL Ratio    TSH        Follow up plan: Return in about 6 months (around 03/14/2016).

## 2015-09-16 LAB — CBC WITH DIFFERENTIAL/PLATELET
BASOS: 0 %
Basophils Absolute: 0 10*3/uL (ref 0.0–0.2)
EOS (ABSOLUTE): 0.1 10*3/uL (ref 0.0–0.4)
EOS: 1 %
HEMOGLOBIN: 14.1 g/dL (ref 11.1–15.9)
Hematocrit: 41.6 % (ref 34.0–46.6)
IMMATURE GRANS (ABS): 0 10*3/uL (ref 0.0–0.1)
IMMATURE GRANULOCYTES: 0 %
LYMPHS: 36 %
Lymphocytes Absolute: 2.4 10*3/uL (ref 0.7–3.1)
MCH: 30.6 pg (ref 26.6–33.0)
MCHC: 33.9 g/dL (ref 31.5–35.7)
MCV: 90 fL (ref 79–97)
MONOCYTES: 5 %
Monocytes Absolute: 0.3 10*3/uL (ref 0.1–0.9)
NEUTROS PCT: 58 %
Neutrophils Absolute: 3.8 10*3/uL (ref 1.4–7.0)
PLATELETS: 256 10*3/uL (ref 150–379)
RBC: 4.61 x10E6/uL (ref 3.77–5.28)
RDW: 14.1 % (ref 12.3–15.4)
WBC: 6.6 10*3/uL (ref 3.4–10.8)

## 2015-09-16 LAB — COMPREHENSIVE METABOLIC PANEL
A/G RATIO: 1.7 (ref 1.1–2.5)
ALT: 32 IU/L (ref 0–32)
AST: 19 IU/L (ref 0–40)
Albumin: 4.3 g/dL (ref 3.6–4.8)
Alkaline Phosphatase: 83 IU/L (ref 39–117)
BUN/Creatinine Ratio: 17 (ref 11–26)
BUN: 12 mg/dL (ref 8–27)
Bilirubin Total: 0.4 mg/dL (ref 0.0–1.2)
CALCIUM: 10.3 mg/dL (ref 8.7–10.3)
CO2: 26 mmol/L (ref 18–29)
Chloride: 102 mmol/L (ref 97–108)
Creatinine, Ser: 0.7 mg/dL (ref 0.57–1.00)
GFR, EST AFRICAN AMERICAN: 107 mL/min/{1.73_m2} (ref >59)
GFR, EST NON AFRICAN AMERICAN: 93 mL/min/{1.73_m2} (ref >59)
Globulin, Total: 2.5 g/dL (ref 1.5–4.5)
Glucose: 103 mg/dL — ABNORMAL HIGH (ref 65–99)
Potassium: 4.2 mmol/L (ref 3.5–5.2)
Sodium: 142 mmol/L (ref 134–144)
TOTAL PROTEIN: 6.8 g/dL (ref 6.0–8.5)

## 2015-09-16 LAB — TSH: TSH: 1.91 u[IU]/mL (ref 0.450–4.500)

## 2015-09-16 LAB — HEPATITIS C ANTIBODY: Hep C Virus Ab: <0.1 s/co ratio (ref 0.0–0.9)

## 2015-09-16 LAB — URIC ACID: Uric Acid: 5.3 mg/dL (ref 2.5–7.1)

## 2015-09-16 LAB — VITAMIN B12: Vitamin B-12: 1664 pg/mL — ABNORMAL HIGH (ref 211–946)

## 2015-09-16 LAB — LIPID PANEL W/O CHOL/HDL RATIO
CHOLESTEROL TOTAL: 187 mg/dL (ref 100–199)
HDL: 38 mg/dL — ABNORMAL LOW (ref >39)
LDL CALC: 101 mg/dL — AB (ref 0–99)
TRIGLYCERIDES: 240 mg/dL — AB (ref 0–149)
VLDL CHOLESTEROL CAL: 48 mg/dL — AB (ref 5–40)

## 2015-09-16 LAB — HIV ANTIBODY (ROUTINE TESTING W REFLEX): HIV Screen 4th Generation wRfx: NONREACTIVE

## 2015-09-16 LAB — VITAMIN D 25 HYDROXY (VIT D DEFICIENCY, FRACTURES): Vit D, 25-Hydroxy: 28.4 ng/mL — ABNORMAL LOW (ref 30.0–100.0)

## 2015-09-18 ENCOUNTER — Encounter: Payer: Self-pay | Admitting: Unknown Physician Specialty

## 2015-10-11 ENCOUNTER — Other Ambulatory Visit: Payer: Self-pay

## 2015-10-11 MED ORDER — HYDROCHLOROTHIAZIDE 25 MG PO TABS: 25.0000 mg | ORAL_TABLET | Freq: Every day | ORAL | Status: DC

## 2015-10-11 NOTE — Telephone Encounter (Signed)
Patient was last seen 09/15/15 and pharmacy is [facility].

## 2015-11-09 ENCOUNTER — Other Ambulatory Visit: Payer: Self-pay

## 2015-11-09 NOTE — Telephone Encounter (Signed)
LAST VISIT: 09/15/2015 NEXT APPT: 02/2016 [facility]  Request for Quinopril 40 mg tab with 90 day supply.

## 2015-11-10 MED ORDER — QUINAPRIL HCL 40 MG PO TABS: 40.0000 mg | ORAL_TABLET | Freq: Every day | ORAL | Status: DC

## 2015-12-06 ENCOUNTER — Other Ambulatory Visit: Payer: Self-pay

## 2015-12-06 MED ORDER — ATORVASTATIN CALCIUM 40 MG PO TABS: 40.0000 mg | ORAL_TABLET | Freq: Every day | ORAL | Status: DC

## 2015-12-06 NOTE — Telephone Encounter (Signed)
Patient was last seen 09/15/15 and has apptointment 03/13/16. Pharmacy is [facility].

## 2016-02-02 ENCOUNTER — Other Ambulatory Visit: Payer: Self-pay

## 2016-02-02 MED ORDER — HYDROCHLOROTHIAZIDE 25 MG PO TABS: 25.0000 mg | ORAL_TABLET | Freq: Every day | ORAL | Status: DC

## 2016-02-02 NOTE — Telephone Encounter (Signed)
Patient has 6 month f/u scheduled for 03/13/16. Pharmacy is [facility].

## 2016-03-13 ENCOUNTER — Encounter: Payer: Self-pay | Admitting: Unknown Physician Specialty

## 2016-03-13 ENCOUNTER — Ambulatory Visit (INDEPENDENT_AMBULATORY_CARE_PROVIDER_SITE_OTHER): Payer: BC Managed Care – PPO | Admitting: Unknown Physician Specialty

## 2016-03-13 VITALS — BP 134/85 | HR 72 | Temp 98.0°F | Ht 63.2 in | Wt 226.0 lb

## 2016-03-13 DIAGNOSIS — R7301 Impaired fasting glucose: Secondary | ICD-10-CM | POA: Diagnosis not present

## 2016-03-13 DIAGNOSIS — I10 Essential (primary) hypertension: Secondary | ICD-10-CM

## 2016-03-13 DIAGNOSIS — M25551 Pain in right hip: Secondary | ICD-10-CM

## 2016-03-13 DIAGNOSIS — E785 Hyperlipidemia, unspecified: Secondary | ICD-10-CM | POA: Diagnosis not present

## 2016-03-13 DIAGNOSIS — E669 Obesity, unspecified: Secondary | ICD-10-CM | POA: Diagnosis not present

## 2016-03-13 DIAGNOSIS — B079 Viral wart, unspecified: Secondary | ICD-10-CM | POA: Diagnosis not present

## 2016-03-13 LAB — LIPID PANEL PICCOLO, WAIVED
CHOL/HDL RATIO PICCOLO,WAIVE: 4.8 mg/dL
Cholesterol Piccolo, Waived: 191 mg/dL (ref <200)
HDL CHOL PICCOLO, WAIVED: 40 mg/dL — AB (ref >59)
LDL Chol Calc Piccolo Waived: 94 mg/dL (ref <100)
TRIGLYCERIDES PICCOLO,WAIVED: 286 mg/dL — AB (ref <150)
VLDL CHOL CALC PICCOLO,WAIVE: 57 mg/dL — AB (ref <30)

## 2016-03-13 LAB — BAYER DCA HB A1C WAIVED: HB A1C (BAYER DCA - WAIVED): 6 % (ref <7.0)

## 2016-03-13 NOTE — Progress Notes (Signed)
BP 134/85 mmHg  Pulse 72  Temp(Src) 98 F (36.7 C)  Ht 5' 3.2" (1.605 m)  Wt 226 lb (102.513 kg)  BMI 39.80 kg/m2  SpO2 98%  LMP  (LMP Unknown)   Subjective:    Patient ID: Barbara Fitzpatrick, female    DOB: February 05, 1951, 65 y.o.   MRN: [id number]  HPI: Barbara Fitzpatrick is a 65 y.o. female  Chief Complaint  Patient presents with  . Hyperlipidemia  . Hypertension   Hypertension Using medications without difficulty Average home BP: Not checking  No problems or lightheadedness No chest pain with exertion or shortness of breath No Edema   Hyperlipidemia Using medications without problems: No Muscle aches  Diet compliance: Not watching what she eats Exercise:not exercising  Obesity Restarted eating sweets and drinking soft drinks as she got discouraged. She started gaining weight  Relevant past medical, surgical, family and social history reviewed and updated as indicated. Interim medical history since our last visit reviewed. Allergies and medications reviewed and updated.  Review of Systems  Musculoskeletal:       Right goin pain and limitiations of flexion    Per HPI unless specifically indicated above     Objective:    BP 134/85 mmHg  Pulse 72  Temp(Src) 98 F (36.7 C)  Ht 5' 3.2" (1.605 m)  Wt 226 lb (102.513 kg)  BMI 39.80 kg/m2  SpO2 98%  LMP  (LMP Unknown)  Wt Readings from Last 3 Encounters:  03/13/16 226 lb (102.513 kg)  09/15/15 202 lb 3.2 oz (91.717 kg)  04/10/15 203 lb (92.08 kg)    Physical Exam  Constitutional: She is oriented to person, place, and time. She appears well-developed and well-nourished. No distress.  HENT:  Head: Normocephalic and atraumatic.  Eyes: Conjunctivae and lids are normal. Right eye exhibits no discharge. Left eye exhibits no discharge. No scleral icterus.  Neck: Normal range of motion. Neck supple. No JVD present. Carotid bruit is not present.  Cardiovascular: Normal rate, regular rhythm and normal heart sounds.    Pulmonary/Chest: Effort normal and breath sounds normal.  Abdominal: Normal appearance. There is no splenomegaly or hepatomegaly.  Musculoskeletal: Normal range of motion.  Neurological: She is alert and oriented to person, place, and time.  Skin: Skin is warm, dry and intact. No rash noted. No pallor.  Lateral foot with wart  Psychiatric: She has a normal mood and affect. Her behavior is normal. Judgment and thought content normal.    Results for orders placed or performed in visit on 09/15/15  CBC with Differential/Platelet  Result Value Ref Range   WBC 6.6 3.4 - 10.8 x10E3/uL   RBC 4.61 3.77 - 5.28 x10E6/uL   Hemoglobin 14.1 11.1 - 15.9 g/dL   Hematocrit 41.6 34.0 - 46.6 %   MCV 90 79 - 97 fL   MCH 30.6 26.6 - 33.0 pg   MCHC 33.9 31.5 - 35.7 g/dL   RDW 14.1 12.3 - 15.4 %   Platelets 256 150 - 379 x10E3/uL   Neutrophils 58 %   Lymphs 36 %   Monocytes 5 %   Eos 1 %   Basos 0 %   Neutrophils Absolute 3.8 1.4 - 7.0 x10E3/uL   Lymphocytes Absolute 2.4 0.7 - 3.1 x10E3/uL   Monocytes Absolute 0.3 0.1 - 0.9 x10E3/uL   EOS (ABSOLUTE) 0.1 0.0 - 0.4 x10E3/uL   Basophils Absolute 0.0 0.0 - 0.2 x10E3/uL   Immature Granulocytes 0 %   Immature Grans (Abs) 0.0  0.0 - 0.1 x10E3/uL  Comprehensive metabolic panel  Result Value Ref Range   Glucose 103 (H) 65 - 99 mg/dL   BUN 12 8 - 27 mg/dL   Creatinine, Ser 0.70 0.57 - 1.00 mg/dL   GFR calc non Af Amer 93 >59 mL/min/1.73   GFR calc Af Amer 107 >59 mL/min/1.73   BUN/Creatinine Ratio 17 11 - 26   Sodium 142 134 - 144 mmol/L   Potassium 4.2 3.5 - 5.2 mmol/L   Chloride 102 97 - 108 mmol/L   CO2 26 18 - 29 mmol/L   Calcium 10.3 8.7 - 10.3 mg/dL   Total Protein 6.8 6.0 - 8.5 g/dL   Albumin 4.3 3.6 - 4.8 g/dL   Globulin, Total 2.5 1.5 - 4.5 g/dL   Albumin/Globulin Ratio 1.7 1.1 - 2.5   Bilirubin Total 0.4 0.0 - 1.2 mg/dL   Alkaline Phosphatase 83 39 - 117 IU/L   AST 19 0 - 40 IU/L   ALT 32 0 - 32 IU/L  Hepatitis C antibody  Result  Value Ref Range   Hep C Virus Ab <0.1 0.0 - 0.9 s/co ratio  HIV antibody  Result Value Ref Range   HIV Screen 4th Generation wRfx Non Reactive Non Reactive  Lipid Panel w/o Chol/HDL Ratio  Result Value Ref Range   Cholesterol, Total 187 100 - 199 mg/dL   Triglycerides 240 (H) 0 - 149 mg/dL   HDL 38 (L) >39 mg/dL   VLDL Cholesterol Cal 48 (H) 5 - 40 mg/dL   LDL Calculated 101 (H) 0 - 99 mg/dL  TSH  Result Value Ref Range   TSH 1.910 0.450 - 4.500 uIU/mL  Vit D  25 hydroxy (rtn osteoporosis monitoring)  Result Value Ref Range   Vit D, 25-Hydroxy 28.4 (L) 30.0 - 100.0 ng/mL  Microalbumin, Urine Waived  Result Value Ref Range   Microalb, Ur Waived 10 0 - 19 mg/L   Creatinine, Urine Waived 100 10 - 300 mg/dL   Microalb/Creat Ratio <30 <30 mg/g  Uric acid  Result Value Ref Range   Uric Acid 5.3 2.5 - 7.1 mg/dL  Bayer DCA Hb A1c Waived  Result Value Ref Range   Bayer DCA Hb A1c Waived 5.7 <7.0 %  Vitamin B12  Result Value Ref Range   Vitamin B-12 1664 (H) 211 - 946 pg/mL      Assessment & Plan:   Problem List Items Addressed This Visit      Unprioritized   Impaired fasting glucose    Hgb A1C is 6.0      Relevant Orders   Bayer DCA Hb A1c Waived   Benign hypertension    Stable, continue present medications.        Relevant Orders   Comprehensive metabolic panel   Hyperlipemia - Primary   Relevant Orders   Lipid Panel Piccolo, Waived   Obesity    Discussed exercise and diet       Other Visit Diagnoses    Right hip pain        Get x-ray.  OTC Aleve and Tylenol    Relevant Orders    DG HIP UNILAT WITH PELVIS 2-3 VIEWS RIGHT    Wart        discussed snd instruction given for duct tape treatment        Follow up plan: Return in about 6 months (around 09/13/2016) for physical.

## 2016-03-13 NOTE — Patient Instructions (Signed)
Use duct tape.  Pare down with emory board, place duct tape over it for 6 days. Leave off overnight.  Repeat process the next day   

## 2016-03-13 NOTE — Assessment & Plan Note (Signed)
Hgb A1C is 6.0.   

## 2016-03-13 NOTE — Assessment & Plan Note (Signed)
Discussed exercise and diet  

## 2016-03-13 NOTE — Assessment & Plan Note (Signed)
Stable, continue present medications.   

## 2016-03-14 LAB — COMPREHENSIVE METABOLIC PANEL
ALT: 50 IU/L — AB (ref 0–32)
AST: 35 IU/L (ref 0–40)
Albumin/Globulin Ratio: 1.6 (ref 1.2–2.2)
Albumin: 4.1 g/dL (ref 3.6–4.8)
Alkaline Phosphatase: 82 IU/L (ref 39–117)
BUN/Creatinine Ratio: 20 (ref 11–26)
BUN: 14 mg/dL (ref 8–27)
Bilirubin Total: 0.4 mg/dL (ref 0.0–1.2)
CALCIUM: 10.3 mg/dL (ref 8.7–10.3)
CHLORIDE: 102 mmol/L (ref 96–106)
CO2: 25 mmol/L (ref 18–29)
CREATININE: 0.7 mg/dL (ref 0.57–1.00)
GFR calc Af Amer: 107 mL/min/{1.73_m2} (ref >59)
GFR, EST NON AFRICAN AMERICAN: 93 mL/min/{1.73_m2} (ref >59)
GLUCOSE: 118 mg/dL — AB (ref 65–99)
Globulin, Total: 2.6 g/dL (ref 1.5–4.5)
Potassium: 4.3 mmol/L (ref 3.5–5.2)
Sodium: 143 mmol/L (ref 134–144)
Total Protein: 6.7 g/dL (ref 6.0–8.5)

## 2016-03-15 ENCOUNTER — Ambulatory Visit
Admission: RE | Admit: 2016-03-15 | Discharge: 2016-03-15 | Disposition: A | Discharge status: Home / Self Care | Payer: BC Managed Care – PPO | Source: Ambulatory Visit | Attending: Unknown Physician Specialty | Admitting: Unknown Physician Specialty

## 2016-03-15 DIAGNOSIS — M1611 Unilateral primary osteoarthritis, right hip: Secondary | ICD-10-CM | POA: Diagnosis not present

## 2016-03-15 DIAGNOSIS — M25551 Pain in right hip: Secondary | ICD-10-CM

## 2016-05-06 ENCOUNTER — Other Ambulatory Visit: Payer: Self-pay

## 2016-05-06 MED ORDER — HYDROCHLOROTHIAZIDE 25 MG PO TABS: 25.0000 mg | ORAL_TABLET | Freq: Every day | ORAL | Status: DC

## 2016-05-06 NOTE — Telephone Encounter (Signed)
Patient was last seen 03/13/16 and has appointment 09/18/16. Pharmacy is [facility].

## 2016-05-09 ENCOUNTER — Other Ambulatory Visit: Payer: Self-pay

## 2016-05-09 MED ORDER — QUINAPRIL HCL 40 MG PO TABS: 40.0000 mg | ORAL_TABLET | Freq: Every day | ORAL | Status: DC

## 2016-05-09 NOTE — Telephone Encounter (Signed)
Patient was last seen 03/13/16 and has appt scheduled 09/18/16. Pharmacy is [facility].

## 2016-06-06 ENCOUNTER — Other Ambulatory Visit: Payer: Self-pay

## 2016-06-06 NOTE — Telephone Encounter (Signed)
Patient was last seen in March and has f/u scheduled in September. Pharmacy is [facility].

## 2016-06-07 MED ORDER — ATORVASTATIN CALCIUM 40 MG PO TABS: 40.0000 mg | ORAL_TABLET | Freq: Every day | ORAL | Status: DC

## 2016-09-16 ENCOUNTER — Encounter: Payer: BC Managed Care – PPO | Admitting: Unknown Physician Specialty

## 2016-09-18 ENCOUNTER — Encounter: Payer: Self-pay | Admitting: Unknown Physician Specialty

## 2016-09-18 ENCOUNTER — Ambulatory Visit (INDEPENDENT_AMBULATORY_CARE_PROVIDER_SITE_OTHER): Payer: BC Managed Care – PPO | Admitting: Unknown Physician Specialty

## 2016-09-18 VITALS — BP 129/84 | HR 83 | Temp 98.3°F | Ht 64.1 in | Wt 232.4 lb

## 2016-09-18 DIAGNOSIS — Z0000 Encounter for general adult medical examination without abnormal findings: Secondary | ICD-10-CM

## 2016-09-18 DIAGNOSIS — Z6839 Body mass index (BMI) 39.0-39.9, adult: Secondary | ICD-10-CM | POA: Diagnosis not present

## 2016-09-18 DIAGNOSIS — I10 Essential (primary) hypertension: Secondary | ICD-10-CM | POA: Diagnosis not present

## 2016-09-18 DIAGNOSIS — E669 Obesity, unspecified: Secondary | ICD-10-CM

## 2016-09-18 DIAGNOSIS — R7301 Impaired fasting glucose: Secondary | ICD-10-CM | POA: Diagnosis not present

## 2016-09-18 LAB — BAYER DCA HB A1C WAIVED: HB A1C (BAYER DCA - WAIVED): 6.2 % (ref <7.0)

## 2016-09-18 NOTE — Progress Notes (Signed)
BP 129/84 (BP Location: Left Arm, Patient Position: Sitting, Cuff Size: Large)   Pulse 83   Temp 98.3 F (36.8 C)   Ht 5' 4.1" (1.628 m)   Wt 232 lb 6.4 oz (105.4 kg)   LMP  (LMP Unknown)   SpO2 93%   BMI 39.77 kg/m    Subjective:    Patient ID: Barbara Fitzpatrick, female    DOB: 1951-01-15, 65 y.o.   MRN: [id number]  HPI: Barbara Fitzpatrick is a 65 y.o. female  Chief Complaint  Patient presents with  . Annual Exam   Hypertension Using medications without difficulty Average home BPs   No problems or lightheadedness No chest pain with exertion or shortness of breath No Edema   Hyperlipidemia Using medications without problems: No Muscle aches  Diet compliance: "Not good"  Thinks she is bored at night.   Exercise:Not exercising.    Allergic rhinitis More problematic.    Relevant past medical, surgical, family and social history reviewed and updated as indicated. Interim medical history since our last visit reviewed. Allergies and medications reviewed and updated.  Review of Systems  All other systems reviewed and are negative.   Per HPI unless specifically indicated above     Objective:    BP 129/84 (BP Location: Left Arm, Patient Position: Sitting, Cuff Size: Large)   Pulse 83   Temp 98.3 F (36.8 C)   Ht 5' 4.1" (1.628 m)   Wt 232 lb 6.4 oz (105.4 kg)   LMP  (LMP Unknown)   SpO2 93%   BMI 39.77 kg/m   Wt Readings from Last 3 Encounters:  09/18/16 232 lb 6.4 oz (105.4 kg)  03/13/16 226 lb (102.5 kg)  09/15/15 202 lb 3.2 oz (91.7 kg)    Physical Exam  Constitutional: She is oriented to person, place, and time. She appears well-developed and well-nourished.  HENT:  Head: Normocephalic and atraumatic.  Eyes: Pupils are equal, round, and reactive to light. Right eye exhibits no discharge. Left eye exhibits no discharge. No scleral icterus.  Neck: Normal range of motion. Neck supple. Carotid bruit is not present. No thyromegaly present.  Cardiovascular:  Normal rate, regular rhythm and normal heart sounds.  Exam reveals no gallop and no friction rub.   No murmur heard. Pulmonary/Chest: Effort normal and breath sounds normal. No respiratory distress. She has no wheezes. She has no rales.  Abdominal: Soft. Bowel sounds are normal. There is no tenderness. There is no rebound.  Genitourinary: No breast swelling, tenderness or discharge.  Musculoskeletal: Normal range of motion.  Lymphadenopathy:    She has no cervical adenopathy.  Neurological: She is alert and oriented to person, place, and time.  Skin: Skin is warm, dry and intact. No rash noted.  Psychiatric: She has a normal mood and affect. Her speech is normal and behavior is normal. Judgment and thought content normal. Cognition and memory are normal.      Assessment & Plan:   Problem List Items Addressed This Visit      Unprioritized   Benign hypertension    Stable, continue present medications.        Relevant Orders   Comprehensive metabolic panel   Lipid Panel w/o Chol/HDL Ratio   Impaired fasting glucose    Check Hgb A1C      Relevant Orders   Bayer DCA Hb A1c Waived   Obesity    Pt ed.  Refer for sleep study      Relevant Orders  Ambulatory referral to Sleep Studies    Other Visit Diagnoses    Routine general medical examination at a health care facility    -  Primary   Relevant Orders   CBC with Differential/Platelet   Lipid Panel w/o Chol/HDL Ratio   TSH   MM DIGITAL SCREENING BILATERAL       Follow up plan: Return in about 6 months (around 03/18/2017).

## 2016-09-18 NOTE — Assessment & Plan Note (Signed)
Check Hgb A1C 

## 2016-09-18 NOTE — Assessment & Plan Note (Signed)
Stable, continue present medications.   

## 2016-09-18 NOTE — Patient Instructions (Signed)

## 2016-09-18 NOTE — Assessment & Plan Note (Signed)
Pt ed.  Refer for sleep study

## 2016-09-19 LAB — TSH: TSH: 1.88 u[IU]/mL (ref 0.450–4.500)

## 2016-09-19 LAB — CBC WITH DIFFERENTIAL/PLATELET
BASOS ABS: 0 10*3/uL (ref 0.0–0.2)
Basos: 0 %
EOS (ABSOLUTE): 0.1 10*3/uL (ref 0.0–0.4)
EOS: 2 %
HEMOGLOBIN: 14.2 g/dL (ref 11.1–15.9)
Hematocrit: 41.7 % (ref 34.0–46.6)
IMMATURE GRANS (ABS): 0 10*3/uL (ref 0.0–0.1)
Immature Granulocytes: 0 %
LYMPHS: 34 %
Lymphocytes Absolute: 2.6 10*3/uL (ref 0.7–3.1)
MCH: 31.1 pg (ref 26.6–33.0)
MCHC: 34.1 g/dL (ref 31.5–35.7)
MCV: 91 fL (ref 79–97)
MONOCYTES: 8 %
Monocytes Absolute: 0.6 10*3/uL (ref 0.1–0.9)
NEUTROS ABS: 4.3 10*3/uL (ref 1.4–7.0)
Neutrophils: 56 %
Platelets: 258 10*3/uL (ref 150–379)
RBC: 4.56 x10E6/uL (ref 3.77–5.28)
RDW: 13.5 % (ref 12.3–15.4)
WBC: 7.6 10*3/uL (ref 3.4–10.8)

## 2016-09-19 LAB — COMPREHENSIVE METABOLIC PANEL
A/G RATIO: 1.7 (ref 1.2–2.2)
ALBUMIN: 4.2 g/dL (ref 3.6–4.8)
ALK PHOS: 90 IU/L (ref 39–117)
ALT: 46 IU/L — AB (ref 0–32)
AST: 33 IU/L (ref 0–40)
BILIRUBIN TOTAL: 0.5 mg/dL (ref 0.0–1.2)
BUN / CREAT RATIO: 13 (ref 12–28)
BUN: 10 mg/dL (ref 8–27)
CHLORIDE: 101 mmol/L (ref 96–106)
CO2: 22 mmol/L (ref 18–29)
Calcium: 10.4 mg/dL — ABNORMAL HIGH (ref 8.7–10.3)
Creatinine, Ser: 0.78 mg/dL (ref 0.57–1.00)
GFR calc Af Amer: 93 mL/min/{1.73_m2} (ref >59)
GFR calc non Af Amer: 81 mL/min/{1.73_m2} (ref >59)
GLUCOSE: 135 mg/dL — AB (ref 65–99)
Globulin, Total: 2.5 g/dL (ref 1.5–4.5)
POTASSIUM: 4.3 mmol/L (ref 3.5–5.2)
Sodium: 143 mmol/L (ref 134–144)
Total Protein: 6.7 g/dL (ref 6.0–8.5)

## 2016-09-19 LAB — LIPID PANEL W/O CHOL/HDL RATIO
CHOLESTEROL TOTAL: 175 mg/dL (ref 100–199)
HDL: 39 mg/dL — ABNORMAL LOW (ref >39)
LDL CALC: 95 mg/dL (ref 0–99)
Triglycerides: 207 mg/dL — ABNORMAL HIGH (ref 0–149)
VLDL Cholesterol Cal: 41 mg/dL — ABNORMAL HIGH (ref 5–40)

## 2016-09-20 ENCOUNTER — Encounter: Payer: Self-pay | Admitting: Unknown Physician Specialty

## 2016-09-20 ENCOUNTER — Telehealth: Payer: Self-pay

## 2016-09-20 NOTE — Telephone Encounter (Signed)
Pt reports snoring and has hypertension

## 2016-09-20 NOTE — Telephone Encounter (Signed)
Referral faxed

## 2016-09-20 NOTE — Telephone Encounter (Signed)
Wellington Hampshire,  Could you document the other reasons for the sleep study for patient other than obesity for [facility]?

## 2016-10-02 ENCOUNTER — Encounter: Payer: Self-pay | Admitting: Internal Medicine

## 2016-10-15 ENCOUNTER — Telehealth: Payer: Self-pay

## 2016-10-15 ENCOUNTER — Ambulatory Visit (INDEPENDENT_AMBULATORY_CARE_PROVIDER_SITE_OTHER): Payer: BC Managed Care – PPO

## 2016-10-15 DIAGNOSIS — Z23 Encounter for immunization: Secondary | ICD-10-CM

## 2016-10-15 DIAGNOSIS — G4733 Obstructive sleep apnea (adult) (pediatric): Secondary | ICD-10-CM

## 2016-10-15 DIAGNOSIS — G2581 Restless legs syndrome: Secondary | ICD-10-CM | POA: Insufficient documentation

## 2016-10-15 DIAGNOSIS — G4730 Sleep apnea, unspecified: Secondary | ICD-10-CM | POA: Insufficient documentation

## 2016-10-15 MED ORDER — PRAMIPEXOLE DIHYDROCHLORIDE 0.5 MG PO TABS: 0.5000 mg | ORAL_TABLET | Freq: Every evening | ORAL | 1 refills | Status: DC | PRN

## 2016-10-15 NOTE — Telephone Encounter (Signed)
Called and left patient a VM letting her know that rx was sent to her pharmacy for her.

## 2016-10-15 NOTE — Telephone Encounter (Signed)
OK, after reading report confirmed diagnosis of RLS

## 2016-10-15 NOTE — Telephone Encounter (Signed)
Patient came in for flu vaccine and stated that she had sleep study done. Stated that they told her that she needed to be prescribed something for restless legs by her PCP.

## 2016-10-22 ENCOUNTER — Ambulatory Visit
Admission: RE | Admit: 2016-10-22 | Discharge: 2016-10-22 | Disposition: A | Discharge status: Home / Self Care | Payer: BC Managed Care – PPO | Source: Ambulatory Visit | Attending: Unknown Physician Specialty | Admitting: Unknown Physician Specialty

## 2016-10-22 DIAGNOSIS — Z1231 Encounter for screening mammogram for malignant neoplasm of breast: Secondary | ICD-10-CM | POA: Diagnosis present

## 2016-10-22 DIAGNOSIS — Z0000 Encounter for general adult medical examination without abnormal findings: Secondary | ICD-10-CM

## 2016-10-23 ENCOUNTER — Other Ambulatory Visit: Payer: Self-pay | Admitting: Unknown Physician Specialty

## 2016-10-23 DIAGNOSIS — N6489 Other specified disorders of breast: Secondary | ICD-10-CM

## 2016-10-25 ENCOUNTER — Encounter: Payer: Self-pay | Admitting: Internal Medicine

## 2016-11-04 ENCOUNTER — Other Ambulatory Visit: Payer: Self-pay

## 2016-11-04 MED ORDER — QUINAPRIL HCL 40 MG PO TABS: 40.0000 mg | ORAL_TABLET | Freq: Every day | ORAL | 1 refills | Status: DC

## 2016-11-05 ENCOUNTER — Ambulatory Visit
Admission: RE | Admit: 2016-11-05 | Discharge: 2016-11-05 | Disposition: A | Discharge status: Home / Self Care | Payer: BC Managed Care – PPO | Source: Ambulatory Visit | Attending: Unknown Physician Specialty | Admitting: Unknown Physician Specialty

## 2016-11-05 DIAGNOSIS — N6489 Other specified disorders of breast: Secondary | ICD-10-CM

## 2016-11-05 DIAGNOSIS — N6310 Unspecified lump in the right breast, unspecified quadrant: Secondary | ICD-10-CM | POA: Insufficient documentation

## 2016-12-03 ENCOUNTER — Other Ambulatory Visit: Payer: Self-pay

## 2016-12-03 MED ORDER — ATORVASTATIN CALCIUM 40 MG PO TABS: 40.0000 mg | ORAL_TABLET | Freq: Every day | ORAL | 1 refills | Status: DC

## 2016-12-25 ENCOUNTER — Telehealth: Payer: Self-pay | Admitting: Unknown Physician Specialty

## 2016-12-25 MED ORDER — CYCLOBENZAPRINE HCL 10 MG PO TABS: 10.0000 mg | ORAL_TABLET | Freq: Three times a day (TID) | ORAL | 0 refills | Status: DC | PRN

## 2016-12-25 NOTE — Telephone Encounter (Signed)
Routing to provider  

## 2016-12-25 NOTE — Telephone Encounter (Signed)
Pt pulled a muscle in her back over the holiday and she would like to know if she could have a muscle relaxer sent to [facility].

## 2016-12-25 NOTE — Telephone Encounter (Signed)
Called and left patient a VM letting her know that a medication was sent in.

## 2017-01-07 ENCOUNTER — Encounter: Payer: BC Managed Care – PPO | Admitting: Internal Medicine

## 2017-01-17 IMAGING — US US BREAST*R* LIMITED INC AXILLA
1 series · 6 of 6 positions shown · non-contrast
Comparison: Previous exam(s).

CLINICAL DATA: The patient was called back from screening
mammography due to asymmetries in the inferior right breast.

EXAM:
2D DIGITAL DIAGNOSTIC RIGHT MAMMOGRAM WITH ADJUNCT TOMO
ULTRASOUND RIGHT BREAST

[Series 1: us breast*right* limited inc axilla · 0.06mm/px · 6 of 6 slices shown]
[im 1/6]
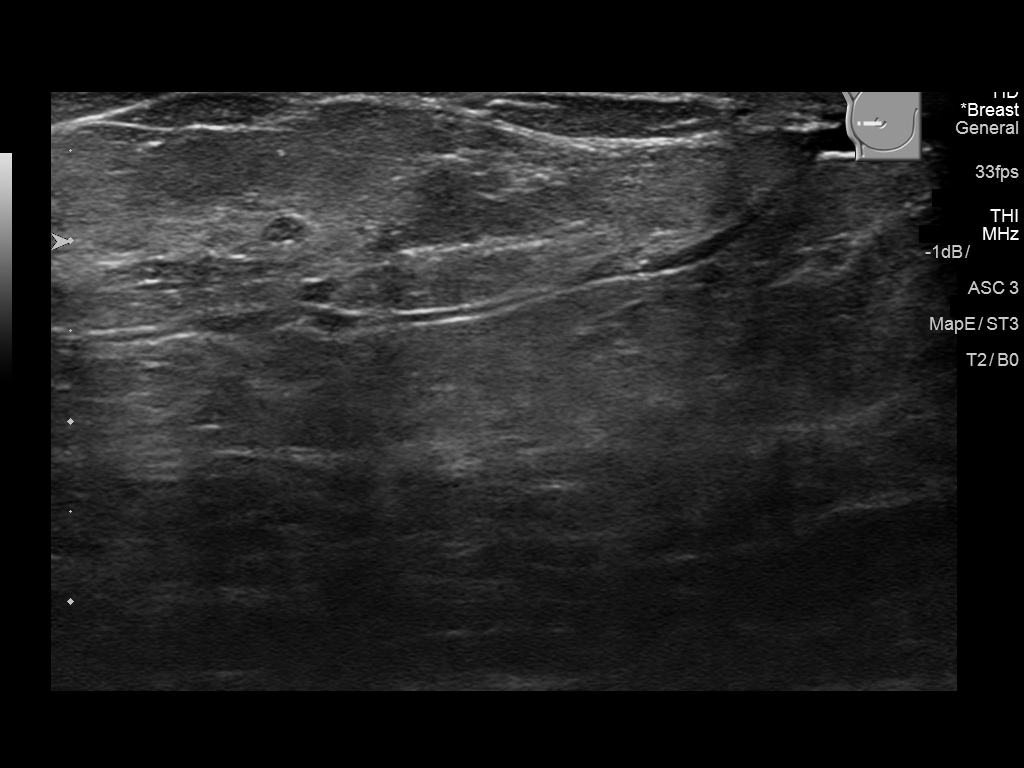
[im 2/6]
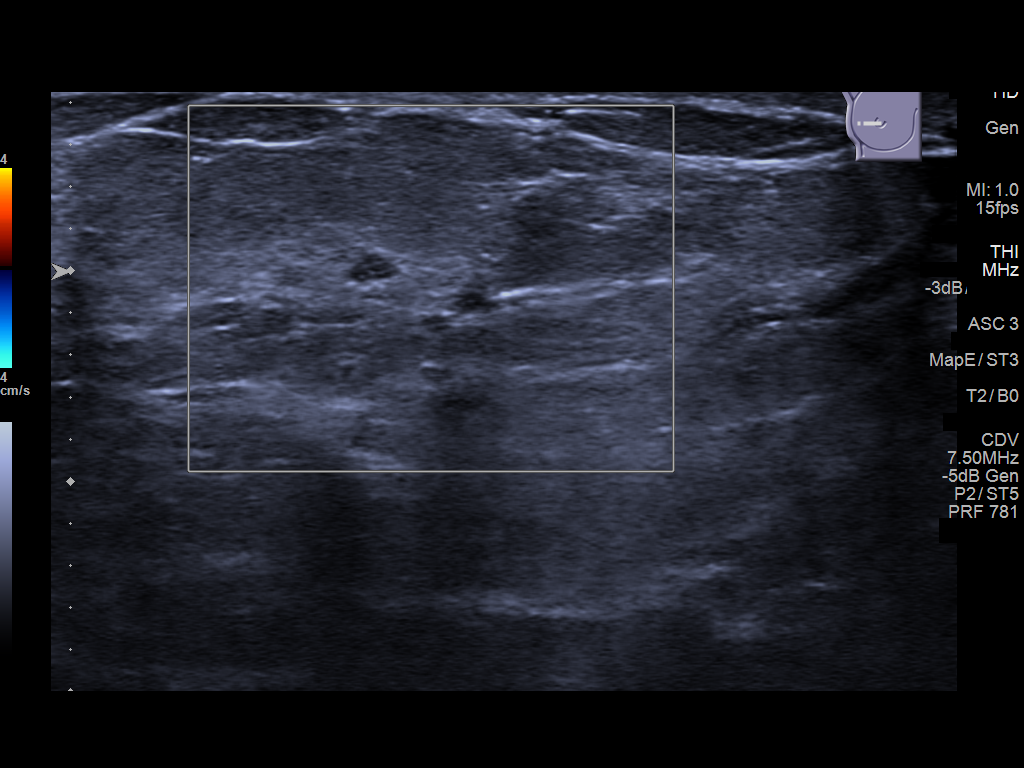
[im 3/6]
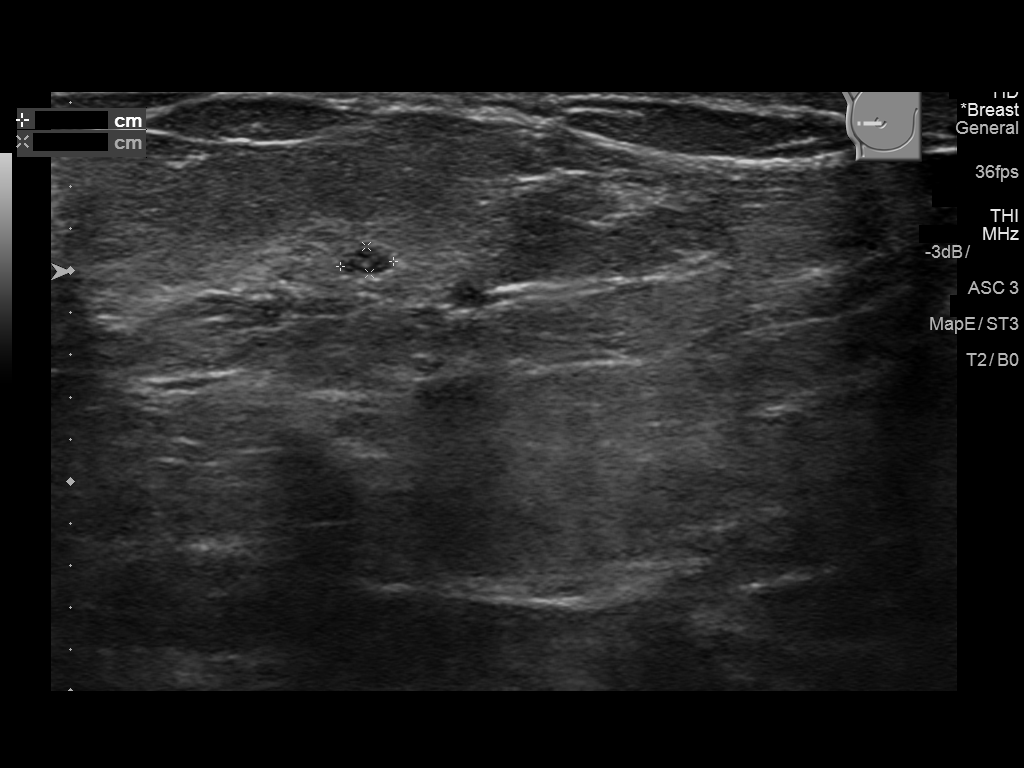
[im 4/6]
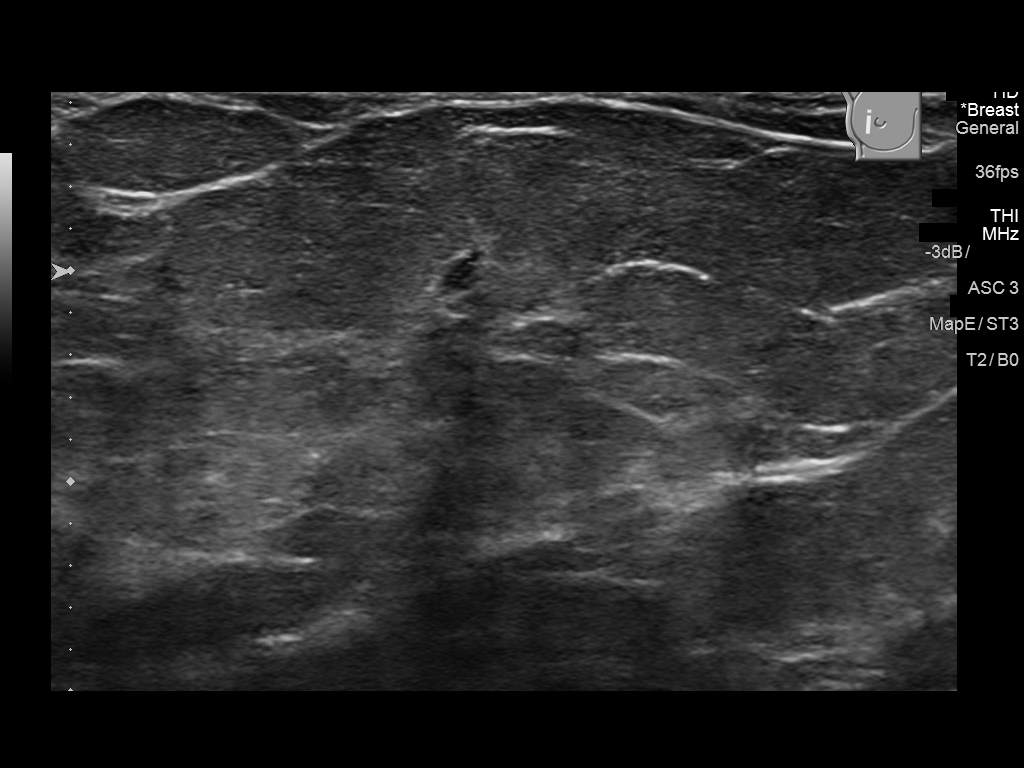
[im 5/6]
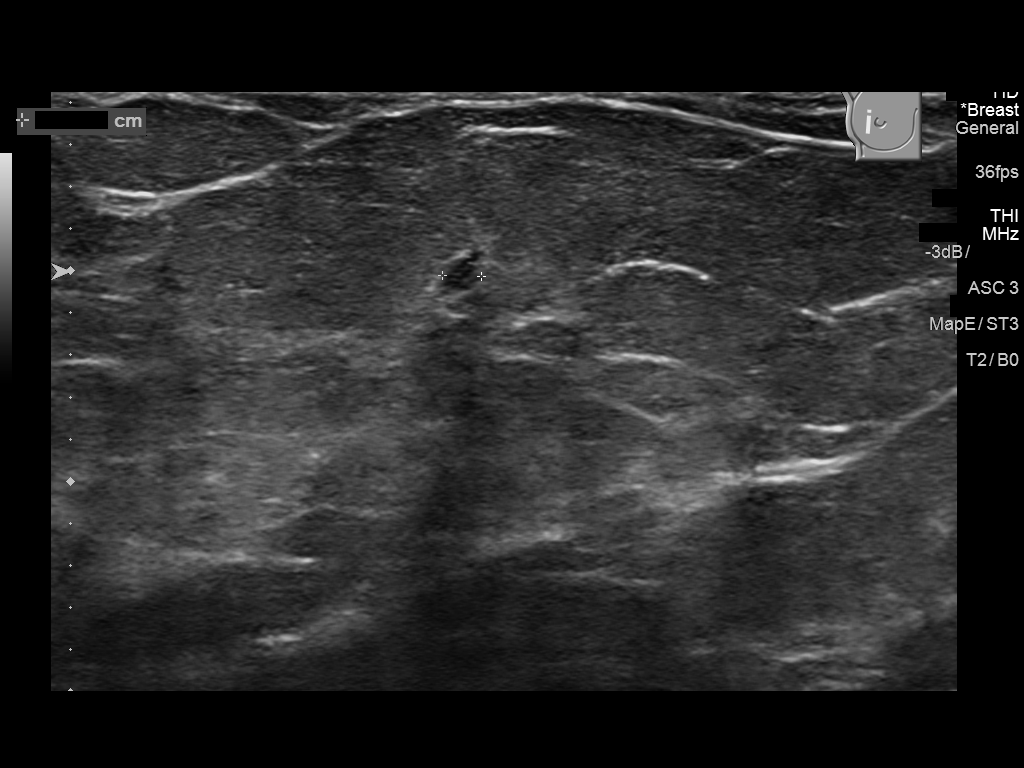
[im 6/6]
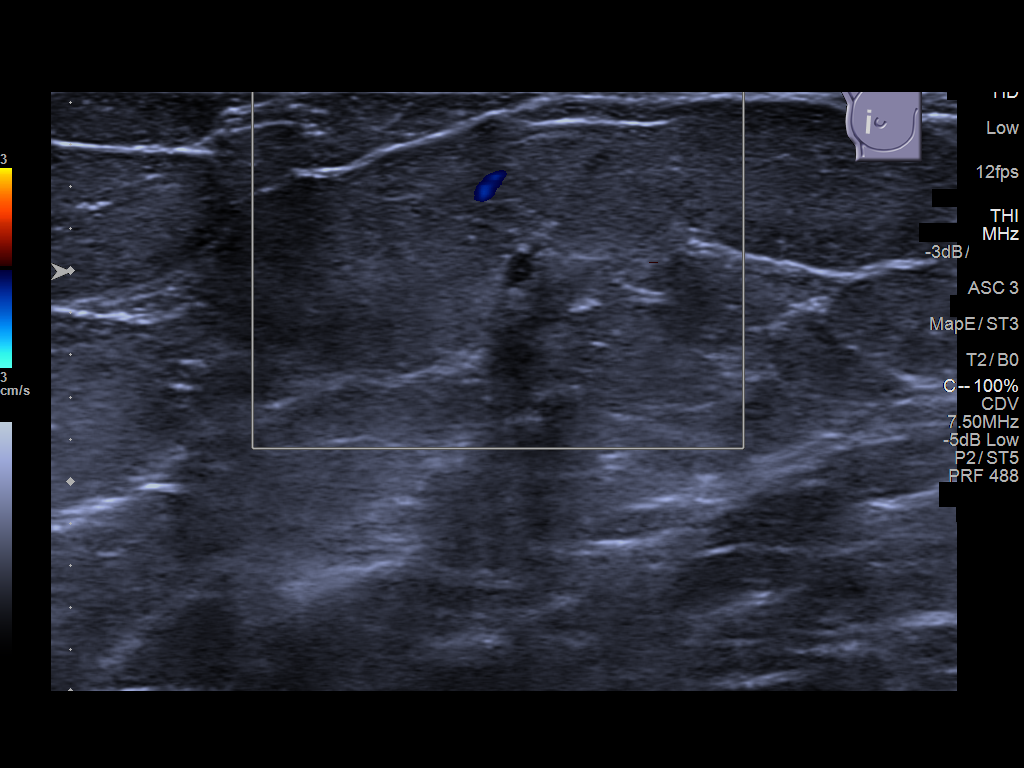

[6 of 6 positions shown; findings below may reference images not displayed]

ACR Breast Density Category b: There are scattered areas of
fibroglandular density.
FINDINGS: Two asymmetries were originally seen in the inferior right breast,
thought to be laterally located. No abnormalities are seen laterally
on today's imaging. The more posterior of the 2 asymmetries on the
MLO view resolves into tissue on today's study. The more anterior
asymmetry does not completely resolve but is not definitively
different since Sunday March, 2014.

On physical exam, no suspicious lumps identified.

Targeted ultrasound is performed, showing a hypoechoic mass at 7
o'clock, 4 cm from the nipple measuring 2 x 3 x 1.3 mm. This may
correlate with the mammographic finding.
IMPRESSION: Probably benign mass in the inferior right breast seen on the MLO
view. This may correlate with a hypoechoic mass seen sonographically
at 7 o'clock which may represent a small complicated cyst. It is
unclear that the findings on the recent screening mammogram are
changed since Sunday March, 2014.

RECOMMENDATION:
Six-month follow-up mammogram and ultrasound of the right breast to
ensure stability of the probably benign mass.

I have discussed the findings and recommendations with the patient.
Results were also provided in writing at the conclusion of the
visit. If applicable, a reminder letter will be sent to the patient
regarding the next appointment.

BI-RADS CATEGORY  3: Probably benign.

## 2017-03-18 ENCOUNTER — Encounter: Payer: Self-pay | Admitting: Unknown Physician Specialty

## 2017-03-18 ENCOUNTER — Ambulatory Visit (INDEPENDENT_AMBULATORY_CARE_PROVIDER_SITE_OTHER): Payer: BC Managed Care – PPO | Admitting: Unknown Physician Specialty

## 2017-03-18 DIAGNOSIS — IMO0001 Reserved for inherently not codable concepts without codable children: Secondary | ICD-10-CM

## 2017-03-18 DIAGNOSIS — I10 Essential (primary) hypertension: Secondary | ICD-10-CM | POA: Diagnosis not present

## 2017-03-18 DIAGNOSIS — E119 Type 2 diabetes mellitus without complications: Secondary | ICD-10-CM | POA: Insufficient documentation

## 2017-03-18 DIAGNOSIS — Z6841 Body Mass Index (BMI) 40.0 and over, adult: Secondary | ICD-10-CM

## 2017-03-18 DIAGNOSIS — R748 Abnormal levels of other serum enzymes: Secondary | ICD-10-CM | POA: Insufficient documentation

## 2017-03-18 DIAGNOSIS — G4733 Obstructive sleep apnea (adult) (pediatric): Secondary | ICD-10-CM

## 2017-03-18 DIAGNOSIS — E6609 Other obesity due to excess calories: Secondary | ICD-10-CM

## 2017-03-18 NOTE — Assessment & Plan Note (Signed)
Pt ed.  Will work with lifestyle center

## 2017-03-18 NOTE — Assessment & Plan Note (Signed)
Continue with Cpap 

## 2017-03-18 NOTE — Assessment & Plan Note (Signed)
Probably related to fatty liver.  Will work on diet and exercise

## 2017-03-18 NOTE — Progress Notes (Signed)
BP 139/85 (BP Location: Left Arm, Patient Position: Sitting, Cuff Size: Large)   Pulse 80   Temp 98 F (36.7 C)   Ht 5\' 4"  (1.626 m)   Wt 244 lb 8 oz (110.9 kg)   LMP  (LMP Unknown)   SpO2 97%   BMI 41.97 kg/m    Subjective:    Patient ID: Barbara Fitzpatrick, female    DOB: [birth year], 66 y.o.   MRN: [id number]  HPI: Barbara Fitzpatrick is a 66 y.o. female  Chief Complaint  Patient presents with  . Hyperlipidemia  . Hypertension   Hypertension Using medications without difficulty Average home BPs   No problems or lightheadedness No chest pain with exertion or shortness of breath No Edema  Hyperlipidemia Using medications without problems: No Muscle aches  Diet compliance: working on her diet  Exercise: not exercising.    Diabetes Had [facility] screening and was shown to have a Hgb A1C of 6.5.    Sleep apnea Using CPAP    Elevated liver enzymes Also noted at [facility] screening.    Relevant past medical, surgical, family and social history reviewed and updated as indicated. Interim medical history since our last visit reviewed. Allergies and medications reviewed and updated.  Review of Systems  Per HPI unless specifically indicated above     Objective:    BP 139/85 (BP Location: Left Arm, Patient Position: Sitting, Cuff Size: Large)   Pulse 80   Temp 98 F (36.7 C)   Ht 5\' 4"  (1.626 m)   Wt 244 lb 8 oz (110.9 kg)   LMP  (LMP Unknown)   SpO2 97%   BMI 41.97 kg/m   Wt Readings from Last 3 Encounters:  03/18/17 244 lb 8 oz (110.9 kg)  09/18/16 232 lb 6.4 oz (105.4 kg)  03/13/16 226 lb (102.5 kg)    Physical Exam  Constitutional: She is oriented to person, place, and time. She appears well-developed and well-nourished. No distress.  HENT:  Head: Normocephalic and atraumatic.  Eyes: Conjunctivae and lids are normal. Right eye exhibits no discharge. Left eye exhibits no discharge. No scleral icterus.  Neck: Normal range of motion. Neck supple. No JVD  present. Carotid bruit is not present.  Cardiovascular: Normal rate, regular rhythm and normal heart sounds.   Pulmonary/Chest: Effort normal and breath sounds normal.  Abdominal: Normal appearance. There is no splenomegaly or hepatomegaly.  Musculoskeletal: Normal range of motion.  Neurological: She is alert and oriented to person, place, and time.  Skin: Skin is warm, dry and intact. No rash noted. No pallor.  Psychiatric: She has a normal mood and affect. Her behavior is normal. Judgment and thought content normal.     Assessment & Plan:   Problem List Items Addressed This Visit      Unprioritized   Benign hypertension    Stable, continue present medications.        Controlled type 2 diabetes mellitus without complication, without long-term current use of insulin (Agency)    New diagnosis.  Hgb A1C is 6.5.  Refer to the lifestyle center.  Discussed diet and exercise and no medications at this point.      Relevant Orders   Amb Referral to Nutrition and Diabetic E   Elevated liver enzymes    Probably related to fatty liver.  Will work on diet and exercise      Obesity    Pt ed.  Will work with lifestyle center      Sleep  apnea    Continue with C pap         No labs today.  Will check them in 6 weeks after working on diet and exercise  Follow up plan: Return in about 6 weeks (around 04/29/2017).

## 2017-03-18 NOTE — Assessment & Plan Note (Addendum)
New diagnosis.  Hgb A1C is 6.5.  Refer to the lifestyle center.  Discussed diet and exercise and no medications at this point.

## 2017-03-18 NOTE — Assessment & Plan Note (Signed)
Stable, continue present medications.   

## 2017-04-10 ENCOUNTER — Encounter: Payer: Self-pay | Admitting: Dietician

## 2017-04-10 ENCOUNTER — Encounter: Payer: BC Managed Care – PPO | Attending: Unknown Physician Specialty | Admitting: Dietician

## 2017-04-10 VITALS — Ht 64.0 in | Wt 246.2 lb

## 2017-04-10 DIAGNOSIS — Z6841 Body Mass Index (BMI) 40.0 and over, adult: Secondary | ICD-10-CM

## 2017-04-10 DIAGNOSIS — E119 Type 2 diabetes mellitus without complications: Secondary | ICD-10-CM | POA: Diagnosis not present

## 2017-04-10 NOTE — Patient Instructions (Signed)
   Make healthy choices when eating out   control portions of starches (carbs) and meats, include vegetables in generous amounts.   Keep up regular walking, increase time spent walking as you are able, this helps with blood sugar, blood pressure, and weight loss.

## 2017-04-10 NOTE — Progress Notes (Signed)
Medical Nutrition Therapy: Visit start time: 1330  end time: 1430  Assessment:  Diagnosis: pre-diabetes, HbA1c 6.5% Past medical history: HTN, HLD, GERD, sleep apnea Psychosocial issues/ stress concerns: patient reports high stress level at this time Preferred learning method:  . Auditory   Current weight: 246.2lbs  Height: 5'4" Medications, supplements: reconciled list in medical record  Progress and evaluation: Patient reports making some changes to improve blood sugar, such as decreasing intake of sweets. Stress has been high due to mother's death about 1 month ago; she is currently cleaning out her house.  She has had significant difficulty with GERD symptoms and is avoiding most fruits and some vegetables, mostly acidic foods.    Physical activity: walking 15 minutes, 5 times a week. Used to walk more and more frequently prior to caring for ill mother.   Dietary Intake:  Usual eating pattern includes 2-3 meals and 1-2 snacks per day. Dining out frequency: 14 meals per week.  Breakfast: Activia yogurt, 2 peanut butter crackers, enough food to take meds (wakes up late) Snack: none Lunch: usually out -- vegetables, pintos, peas, squash casserole, potatoes and gravy, sometimes with meat chicken grilled or fried, or fish, sometimes hamburger 1-2x a week lettuce, tom, mayo.  Snack: none Supper: similar to lunch foods, occasionally pizza at most once a week Snack: 9pm wants snack -- crackers whole grain or cheez-its. Can eat melons, sometimes grapes, canned fruit; sometimes granola bar or nuts. Tries to limit ice cream.  Beverages: water, diet sodas  Nutrition Care Education: Topics covered: diabetes Basic nutrition: basic food groups, appropriate nutrient balance, appropriate meal and snack schedule, general nutrition guidelines    Weight control: benefits of weight control, importance of controlling fat and sugar intake, including low-calorie vegetables, and controlling food portions;  guidance for approx. 1400kcal daily intake; role of exercise, benefits of tracking food intake.  Advanced nutrition: dining out, food label reading Diabetes:  appropriate meal and snack schedule, appropriate carb intake and balance, basic meal planning using plate method and food models, role of fiber and protein in controlling BGs. Hypertension:  importance of controlling BP, identifying high sodium foods, identifying food sources of potassium, magnesium  Nutritional Diagnosis:  [place]-2.2 Altered nutrition-related laboratory As related to HbA1C 6.5%.  As evidenced by lab report, patient report. [place]-3.3 Overweight/obesity As related to excess calories, inactivity.  As evidenced by BMI 42, patient report.  Intervention: Instruction as noted above.   Set goals with direction from patient.      Education Materials given:  . General diet guidelines for Diabetes . Food lists/ Planning A Balanced Meal . Sample meal pattern/ menus . Goals/ instructions  Learner/ who was taught:  . Patient  . Family member: daughter  Level of understanding: Marland Kitchen Verbalizes/ demonstrates competency  Demonstrated degree of understanding via:   Teach back Learning barriers: . None  Willingness to learn/ readiness for change: . Eager, change in progress  Monitoring and Evaluation:  Dietary intake, exercise, BG control, and body weight      follow up: 05/15/17

## 2017-04-25 ENCOUNTER — Encounter: Payer: Self-pay | Admitting: Internal Medicine

## 2017-04-30 ENCOUNTER — Ambulatory Visit (INDEPENDENT_AMBULATORY_CARE_PROVIDER_SITE_OTHER): Payer: BC Managed Care – PPO | Admitting: Unknown Physician Specialty

## 2017-04-30 ENCOUNTER — Encounter: Payer: Self-pay | Admitting: Unknown Physician Specialty

## 2017-04-30 VITALS — BP 128/82 | HR 76 | Temp 98.0°F | Wt 236.3 lb

## 2017-04-30 DIAGNOSIS — R928 Other abnormal and inconclusive findings on diagnostic imaging of breast: Secondary | ICD-10-CM | POA: Diagnosis not present

## 2017-04-30 DIAGNOSIS — E119 Type 2 diabetes mellitus without complications: Secondary | ICD-10-CM

## 2017-04-30 DIAGNOSIS — E785 Hyperlipidemia, unspecified: Secondary | ICD-10-CM

## 2017-04-30 DIAGNOSIS — I10 Essential (primary) hypertension: Secondary | ICD-10-CM | POA: Diagnosis not present

## 2017-04-30 DIAGNOSIS — R7301 Impaired fasting glucose: Secondary | ICD-10-CM | POA: Diagnosis not present

## 2017-04-30 LAB — BAYER DCA HB A1C WAIVED: HB A1C: 6.2 % (ref <7.0)

## 2017-04-30 MED ORDER — QUINAPRIL HCL 40 MG PO TABS: 40.0000 mg | ORAL_TABLET | Freq: Every day | ORAL | 6 refills | Status: DC

## 2017-04-30 MED ORDER — ATORVASTATIN CALCIUM 40 MG PO TABS: 40.0000 mg | ORAL_TABLET | Freq: Every day | ORAL | 6 refills | Status: DC

## 2017-04-30 NOTE — Progress Notes (Signed)
BP 128/82   Pulse 76   Temp 98 F (36.7 C)   Wt 236 lb 4.8 oz (107.2 kg)   LMP  (LMP Unknown)   SpO2 92%   BMI 40.56 kg/m    Subjective:    Patient ID: Barbara Fitzpatrick, female    DOB: [birth year], 67 y.o.   MRN: [id number]  HPI: Barbara Fitzpatrick is a 66 y.o. female  Chief Complaint  Patient presents with  . Follow-up    6 week f/up with labs    Diabetes: hgb A1C 6.5 last visit.  She has been working with the lifestyle center and lost 10 pounds  Hypertension  Using medications without difficulty Average home BPs   Using medication without problems or lightheadedness No chest pain with exertion or shortness of breath No Edema  Elevated Cholesterol Using medications without problems No Muscle aches  Diet/Exercise: Diet is good but needs to exercise more.    Abnormal mammogram Pt states she need a f/u US but refusing the the mammogram as she feels it is an unnecessary test.    Past Medical History:  Diagnosis Date  . Arthritis   . Depression   . GERD (gastroesophageal reflux disease)   . Hyperlipidemia   . Hypertension   . Personal history of colonic adenomas 06/16/2013   4 adenomas (max 1 cm)  04/2003  no polyps 04/2005    Family History  Problem Relation Age of Onset  . Hypertension Mother   . Hyperlipidemia Mother   . Diabetes Mother   . Cancer Mother     colon (benign)  . Glaucoma Mother   . Heart disease Father   . Hypertension Father   . Hyperlipidemia Father   . Diabetes Father   . Diabetes Sister   . Lung disease Sister     polyps  . Cancer Brother     kidney  . Glaucoma Brother   . Hyperlipidemia Brother   . Hypertension Brother   . Stroke Maternal Grandfather   . Diabetes Paternal Grandmother   . Heart disease Paternal Grandfather   . Cancer Maternal Aunt 50    ovarian  . Colon cancer Neg Hx   . Esophageal cancer Neg Hx   . Rectal cancer Neg Hx   . Stomach cancer Neg Hx      Relevant past medical, surgical, family and social  history reviewed and updated as indicated. Interim medical history since our last visit reviewed. Allergies and medications reviewed and updated.  Review of Systems  Per HPI unless specifically indicated above     Objective:    BP 128/82   Pulse 76   Temp 98 F (36.7 C)   Wt 236 lb 4.8 oz (107.2 kg)   LMP  (LMP Unknown)   SpO2 92%   BMI 40.56 kg/m   Wt Readings from Last 3 Encounters:  04/30/17 236 lb 4.8 oz (107.2 kg)  04/10/17 246 lb 3.2 oz (111.7 kg)  03/18/17 244 lb 8 oz (110.9 kg)    Physical Exam  Constitutional: She is oriented to person, place, and time. She appears well-developed and well-nourished. No distress.  HENT:  Head: Normocephalic and atraumatic.  Eyes: Conjunctivae and lids are normal. Right eye exhibits no discharge. Left eye exhibits no discharge. No scleral icterus.  Neck: Normal range of motion. Neck supple. No JVD present. Carotid bruit is not present.  Cardiovascular: Normal rate, regular rhythm and normal heart sounds.   Pulmonary/Chest: Effort normal and breath sounds  normal.  Abdominal: Normal appearance. There is no splenomegaly or hepatomegaly.  Musculoskeletal: Normal range of motion.  Neurological: She is alert and oriented to person, place, and time.  Skin: Skin is warm, dry and intact. No rash noted. No pallor.  Psychiatric: She has a normal mood and affect. Her behavior is normal. Judgment and thought content normal.      Assessment & Plan:   Problem List Items Addressed This Visit      Unprioritized   Abnormal mammogram of right breast    [place] ordered.  Refusing mammogram      Relevant Orders   US BREAST COMPLETE UNI RIGHT INC AXILLA   Benign hypertension    Stable, continue present medications.        Relevant Medications   quinapril (ACCUPRIL) 40 MG tablet   atorvastatin (LIPITOR) 40 MG tablet   Other Relevant Orders   Lipid Panel w/o Chol/HDL Ratio   Comprehensive metabolic panel   RESOLVED: Controlled type 2 diabetes  mellitus without complication, without long-term current use of insulin (HCC) - Primary   Relevant Medications   quinapril (ACCUPRIL) 40 MG tablet   atorvastatin (LIPITOR) 40 MG tablet   Other Relevant Orders   Bayer DCA Hb A1c Waived (Completed)   Comprehensive metabolic panel   Hyperlipemia    Stable.  Needs 30 day Atorvastatin rx      Relevant Medications   quinapril (ACCUPRIL) 40 MG tablet   atorvastatin (LIPITOR) 40 MG tablet   Impaired fasting glucose    Hgb A1C 6.2 and diet controlled          Follow up plan: Return in about 6 months (around 10/31/2017) for physical.

## 2017-04-30 NOTE — Assessment & Plan Note (Signed)
Stable.  Needs 30 day Atorvastatin rx

## 2017-04-30 NOTE — Assessment & Plan Note (Signed)
[place] ordered.  Refusing mammogram

## 2017-04-30 NOTE — Assessment & Plan Note (Signed)
Stable, continue present medications.   

## 2017-04-30 NOTE — Assessment & Plan Note (Signed)
Hgb A1C 6.2 and diet controlled

## 2017-05-01 LAB — COMPREHENSIVE METABOLIC PANEL
ALK PHOS: 79 IU/L (ref 39–117)
ALT: 45 IU/L — AB (ref 0–32)
AST: 33 IU/L (ref 0–40)
Albumin/Globulin Ratio: 1.9 (ref 1.2–2.2)
Albumin: 4.3 g/dL (ref 3.6–4.8)
BILIRUBIN TOTAL: 0.6 mg/dL (ref 0.0–1.2)
BUN/Creatinine Ratio: 16 (ref 12–28)
BUN: 12 mg/dL (ref 8–27)
CHLORIDE: 101 mmol/L (ref 96–106)
CO2: 26 mmol/L (ref 18–29)
Calcium: 10.5 mg/dL — ABNORMAL HIGH (ref 8.7–10.3)
Creatinine, Ser: 0.74 mg/dL (ref 0.57–1.00)
GFR calc non Af Amer: 86 mL/min/{1.73_m2} (ref >59)
GFR, EST AFRICAN AMERICAN: 99 mL/min/{1.73_m2} (ref >59)
Globulin, Total: 2.3 g/dL (ref 1.5–4.5)
Glucose: 124 mg/dL — ABNORMAL HIGH (ref 65–99)
Potassium: 3.7 mmol/L (ref 3.5–5.2)
Sodium: 142 mmol/L (ref 134–144)
TOTAL PROTEIN: 6.6 g/dL (ref 6.0–8.5)

## 2017-05-01 LAB — LIPID PANEL W/O CHOL/HDL RATIO
Cholesterol, Total: 153 mg/dL (ref 100–199)
HDL: 33 mg/dL — ABNORMAL LOW (ref >39)
LDL CALC: 84 mg/dL (ref 0–99)
Triglycerides: 182 mg/dL — ABNORMAL HIGH (ref 0–149)
VLDL Cholesterol Cal: 36 mg/dL (ref 5–40)

## 2017-05-15 ENCOUNTER — Ambulatory Visit: Payer: BC Managed Care – PPO | Admitting: Dietician

## 2017-05-22 ENCOUNTER — Encounter: Payer: Self-pay | Admitting: Dietician

## 2017-05-22 ENCOUNTER — Encounter: Payer: BC Managed Care – PPO | Attending: Unknown Physician Specialty | Admitting: Dietician

## 2017-05-22 VITALS — Ht 64.0 in | Wt 235.2 lb

## 2017-05-22 DIAGNOSIS — E119 Type 2 diabetes mellitus without complications: Secondary | ICD-10-CM | POA: Diagnosis not present

## 2017-05-22 DIAGNOSIS — Z6841 Body Mass Index (BMI) 40.0 and over, adult: Secondary | ICD-10-CM

## 2017-05-22 NOTE — Progress Notes (Signed)
Medical Nutrition Therapy: Visit start time: 7564  end time: 1350  Assessment:  Diagnosis: pre-diabetes, obesity Medical history changes: no changes per patient Psychosocial issues/ stress concerns: none  Current weight: 235.2lbs  Height: 5'4" Medications, supplement changes: no changes per patient  Progress and evaluation: Patient reports positive diet changes to reduce carb intake and calories; she has decreased use of mayo when out, eating 1/2 bun, increased vegetables, has occasionally eaten onioin rings. She is keeping a food diary daily.  Physical activity: less walking, not regularly recently, but has increased other general activity with moving and lifting.   Dietary Intake:  Usual eating pattern includes 3 meals and 0-1 snacks per day. Dining out frequency: 14 meals per week.  Breakfast: light meal to take meds, fruit, jello with fruit, crackers or bread, or yogurt Snack: none Lunch: salad or sandwich or lean meat with starchy and low-carb vegetables Snack: none Supper: same as lunch Snack: nuts if hungry Beverages: 3-4 bottles water daily, occasional diet soda  Nutrition Care Education: Topics covered: weight management, diabetes prevention Weight control: reviewed patient's progress since previous visit including food diary. Discussed factors affecting fat loss, lean body weight, and metabolism and role of regular exercise, vitamins that could affect energy.  Advanced nutrition: dining out and buffets, vacation eating; meal planning resources. Diabetes prevention:  Effects of weight loss on blood sugars  Nutritional Diagnosis:  [place]-2.2 Altered nutrition-related laboratory As related to HbA1C 6.5%.  As evidenced by lab report, patient report. [place] -3.3 Overweight/obesity As related to excess calories, inactivity.  As evidenced by BMI 40.  Intervention: Discussion as noted above.   Goal is to continue with current eating pattern, to continue with effort to increase  exercise.   Patient requested follow-up in 2 months.  Education Materials given:  Marland Kitchen Goals/ instructions   Learner/ who was taught:  . Patient  . Family member: daughter  Level of understanding: Marland Kitchen Verbalizes/ demonstrates competency  Demonstrated degree of understanding via:   Teach back Learning barriers: . None  Willingness to learn/ readiness for change: . Eager, change in progress  Monitoring and Evaluation:  Dietary intake, exercise, BG control, and body weight      follow up: 07/24/17

## 2017-05-22 NOTE — Patient Instructions (Signed)
   Continue with current eating pattern.  Keep up some regular activity.

## 2017-06-03 ENCOUNTER — Other Ambulatory Visit: Payer: Self-pay | Admitting: Unknown Physician Specialty

## 2017-06-03 MED ORDER — CYCLOBENZAPRINE HCL 10 MG PO TABS
10.0000 mg | ORAL_TABLET | Freq: Three times a day (TID) | ORAL | 0 refills | Status: DC | PRN
Start: 2017-06-03 — End: 2018-11-17

## 2017-06-03 NOTE — Telephone Encounter (Signed)
Routing to provider  

## 2017-06-03 NOTE — Telephone Encounter (Signed)
Patient called to see if Malachy Mood would call in a refill on her Cyclobenbaprine 10mg  to [facility]  [phone]    Thanks

## 2017-06-03 NOTE — Telephone Encounter (Signed)
Called and left patient a VM letting her know that her medication was sent in for her as requested.

## 2017-06-04 LAB — HM DIABETES EYE EXAM

## 2017-06-05 ENCOUNTER — Telehealth: Payer: Self-pay | Admitting: Unknown Physician Specialty

## 2017-06-05 ENCOUNTER — Other Ambulatory Visit: Payer: Self-pay

## 2017-06-05 DIAGNOSIS — N6310 Unspecified lump in the right breast, unspecified quadrant: Secondary | ICD-10-CM

## 2017-06-05 NOTE — Telephone Encounter (Signed)
Called and let patient know that I have called [name] and got the right orders put in. I explained that Barbara Fitzpatrick will have to sign off on the orders in the morning but then she should be able to call after that to schedule her mammogram.

## 2017-06-05 NOTE — Telephone Encounter (Signed)
Called [name] to see what orders need to be in for patient to have her mammogram done. Roselyn Reef at [facility] said the right limited ultrasound and the diagnostic right mammogram. Will enter orders, Malachy Mood will have to sign off on and then patient can call to schedule mammogram.

## 2017-06-05 NOTE — Telephone Encounter (Signed)
Patient saying that [facility] cannot do the mammogram due to the providers orders for the ultrasound being put in differently.  Patient wants to make sure orders are correct so she does not have to pay out of pocket.   Please Advise.  Thank you   [facility]: [phone]

## 2017-06-09 ENCOUNTER — Other Ambulatory Visit: Payer: Self-pay

## 2017-06-10 MED ORDER — HYDROCHLOROTHIAZIDE 25 MG PO TABS: 25.0000 mg | ORAL_TABLET | Freq: Every day | ORAL | 3 refills | Status: DC

## 2017-06-17 ENCOUNTER — Ambulatory Visit (AMBULATORY_SURGERY_CENTER): Payer: Self-pay

## 2017-06-17 VITALS — Ht 64.0 in | Wt 229.0 lb

## 2017-06-17 DIAGNOSIS — Z86010 Personal history of colonic polyps: Secondary | ICD-10-CM

## 2017-06-17 NOTE — Progress Notes (Signed)
Per pt, no allergies to soy or egg products.Pt not taking any weight loss meds or using  O2 at home.   Pt refused Emmi video. 

## 2017-06-18 ENCOUNTER — Encounter: Payer: Self-pay | Admitting: Internal Medicine

## 2017-06-23 ENCOUNTER — Encounter: Payer: Self-pay | Admitting: Internal Medicine

## 2017-07-01 ENCOUNTER — Encounter: Payer: Self-pay | Admitting: Internal Medicine

## 2017-07-01 ENCOUNTER — Ambulatory Visit (AMBULATORY_SURGERY_CENTER): Payer: Medicare Other | Admitting: Internal Medicine

## 2017-07-01 VITALS — BP 131/75 | HR 70 | Temp 97.1°F | Resp 17 | Ht 64.0 in | Wt 229.0 lb

## 2017-07-01 DIAGNOSIS — D126 Benign neoplasm of colon, unspecified: Secondary | ICD-10-CM | POA: Diagnosis not present

## 2017-07-01 DIAGNOSIS — D124 Benign neoplasm of descending colon: Secondary | ICD-10-CM

## 2017-07-01 DIAGNOSIS — Z86010 Personal history of colonic polyps: Secondary | ICD-10-CM

## 2017-07-01 DIAGNOSIS — K635 Polyp of colon: Secondary | ICD-10-CM | POA: Diagnosis not present

## 2017-07-01 DIAGNOSIS — D122 Benign neoplasm of ascending colon: Secondary | ICD-10-CM

## 2017-07-01 MED ORDER — SODIUM CHLORIDE 0.9 % IV SOLN: 500.0000 mL | INTRAVENOUS | Status: DC

## 2017-07-01 NOTE — Progress Notes (Signed)
Alert and oriented x3, pleased with MAC, report to RN  

## 2017-07-01 NOTE — Patient Instructions (Addendum)
   I found removed two tiny polyps. I will let you know pathology results and when to have another routine colonoscopy by mail and/or My Chart.  I appreciate the opportunity to care for you. Gatha Mayer, MD, [facility]  **Handout given on polyp**   YOU HAD AN ENDOSCOPIC PROCEDURE TODAY: Refer to the procedure report and other information in the discharge instructions given to you for any specific questions about what was found during the examination. If this information does not answer your questions, please call [place] office at [phone] to clarify.   YOU SHOULD EXPECT: Some feelings of bloating in the abdomen. Passage of more gas than usual. Walking can help get rid of the air that was put into your GI tract during the procedure and reduce the bloating. If you had a lower endoscopy (such as a colonoscopy or flexible sigmoidoscopy) you may notice spotting of blood in your stool or on the toilet paper. Some abdominal soreness may be present for a day or two, also.  DIET: Your first meal following the procedure should be a light meal and then it is ok to progress to your normal diet. A half-sandwich or bowl of soup is an example of a good first meal. Heavy or fried foods are harder to digest and may make you feel nauseous or bloated. Drink plenty of fluids but you should avoid alcoholic beverages for 24 hours. If you had a esophageal dilation, please see attached instructions for diet.    ACTIVITY: Your care partner should take you home directly after the procedure. You should plan to take it easy, moving slowly for the rest of the day. You can resume normal activity the day after the procedure however YOU SHOULD NOT DRIVE, use power tools, machinery or perform tasks that involve climbing or major physical exertion for 24 hours (because of the sedation medicines used during the test).   SYMPTOMS TO REPORT IMMEDIATELY: A gastroenterologist can be reached at any hour. Please call [phone]   for any of the following symptoms:  Following lower endoscopy (colonoscopy, flexible sigmoidoscopy) Excessive amounts of blood in the stool  Significant tenderness, worsening of abdominal pains  Swelling of the abdomen that is new, acute  Fever of 100 or higher    FOLLOW UP:  If any biopsies were taken you will be contacted by phone or by letter within the next 1-3 weeks. Call [phone]  if you have not heard about the biopsies in 3 weeks.  Please also call with any specific questions about appointments or follow up tests.

## 2017-07-01 NOTE — Progress Notes (Signed)
Pt's states no medical or surgical changes since previsit or office visit. 

## 2017-07-01 NOTE — Progress Notes (Signed)
Called to room to assist during endoscopic procedure.  Patient ID and intended procedure confirmed with present staff. Received instructions for my participation in the procedure from the performing physician.  

## 2017-07-01 NOTE — Op Note (Signed)
[facility] Patient Name: Barbara Fitzpatrick Procedure Date: 07/01/2017 10:17 AM MRN: [id number] Endoscopist: Gatha Mayer , MD Age: 66 Referring MD:  Date of Birth: 06/08/1951 Gender: Female Account #: [id number] Procedure:                Colonoscopy Indications:              Surveillance: Personal history of adenomatous                            polyps on last colonoscopy 5 years ago Medicines:                Propofol per Anesthesia, Monitored Anesthesia Care Procedure:                Pre-Anesthesia Assessment:                           - Prior to the procedure, a History and Physical                            was performed, and patient medications and                            allergies were reviewed. The patient's tolerance of                            previous anesthesia was also reviewed. The risks                            and benefits of the procedure and the sedation                            options and risks were discussed with the patient.                            All questions were answered, and informed consent                            was obtained. Prior Anticoagulants: The patient has                            taken no previous anticoagulant or antiplatelet                            agents. ASA Grade Assessment: III - A patient with                            severe systemic disease. After reviewing the risks                            and benefits, the patient was deemed in                            satisfactory condition to undergo the procedure.  After obtaining informed consent, the colonoscope                            was passed under direct vision. Throughout the                            procedure, the patient's blood pressure, pulse, and                            oxygen saturations were monitored continuously. The                            Colonoscope was introduced through the anus and    advanced to the the cecum, identified by                            appendiceal orifice and ileocecal valve. The                            colonoscopy was performed without difficulty. The                            patient tolerated the procedure well. The quality                            of the bowel preparation was adequate. The bowel                            preparation used was Miralax. The ileocecal valve,                            appendiceal orifice, and rectum were photographed. Scope In: 10:23:22 AM Scope Out: 10:46:37 AM Scope Withdrawal Time: 0 hours 16 minutes 36 seconds  Total Procedure Duration: 0 hours 23 minutes 15 seconds  Findings:                 The perianal and digital rectal examinations were                            normal.                           Two sessile polyps were found in the descending                            colon and ascending colon. The polyps were                            diminutive in size. These polyps were removed with                            a cold snare. Resection and retrieval were                            complete. Verification of  patient identification                            for the specimen was done. Estimated blood loss was                            minimal.                           The exam was otherwise without abnormality on                            direct and retroflexion views. Complications:            No immediate complications. Estimated Blood Loss:     Estimated blood loss was minimal. Impression:               - Two diminutive polyps in the descending colon and                            in the ascending colon, removed with a cold snare.                            Resected and retrieved.                           - The examination was otherwise normal on direct                            and retroflexion views.                           - Personal history of colonic polyps. Recommendation:           -  Patient has a contact number available for                            emergencies. The signs and symptoms of potential                            delayed complications were discussed with the                            patient. Return to normal activities tomorrow.                            Written discharge instructions were provided to the                            patient.                           - Resume previous diet.                           - Continue present medications.                           -  Repeat colonoscopy is recommended for                            surveillance. The colonoscopy date will be                            determined after pathology results from today's                            exam become available for review. Gatha Mayer, MD 07/01/2017 10:59:47 AM This report has been signed electronically.

## 2017-07-01 NOTE — Progress Notes (Signed)
PT. unsure if she was cleaned out enough,stated after taking prep last night she went one time very little that the prep didn't start working until this morning and she went 3 times and it was Barbara Fitzpatrick liquid made dr. Carlean Purl aware and he came to admitting to discuss with pt. They decided to proceed with procedure. Pt. Went to bathroom in admitting and tan liquid noted with small amount of sediament.

## 2017-07-02 ENCOUNTER — Telehealth: Payer: Self-pay | Admitting: *Deleted

## 2017-07-02 NOTE — Telephone Encounter (Signed)
  Follow up Call-  Call back number 07/01/2017  Post procedure Call Back phone  # [phone]  Permission to leave phone message Yes  Some recent data might be hidden     Patient questions:  Do you have a fever, pain , or abdominal swelling? No. Pain Score  0 *  Have you tolerated food without any problems? Yes.    Have you been able to return to your normal activities? Yes.    Do you have any questions about your discharge instructions: Diet   No. Medications  No. Follow up visit  No.  Do you have questions or concerns about your Care? No.  Actions: * If pain score is 4 or above: No action needed, pain <4.

## 2017-07-10 ENCOUNTER — Ambulatory Visit
Admission: RE | Admit: 2017-07-10 | Discharge: 2017-07-10 | Disposition: A | Discharge status: Home / Self Care | Payer: Medicare Other | Source: Ambulatory Visit | Attending: Unknown Physician Specialty | Admitting: Unknown Physician Specialty

## 2017-07-10 ENCOUNTER — Encounter: Payer: Self-pay | Admitting: Internal Medicine

## 2017-07-10 DIAGNOSIS — N6310 Unspecified lump in the right breast, unspecified quadrant: Secondary | ICD-10-CM

## 2017-07-10 NOTE — Progress Notes (Signed)
1 adenoma and 1 hyperplastic Recall 5 years 2023 My Chart letter

## 2017-07-24 ENCOUNTER — Ambulatory Visit: Payer: BC Managed Care – PPO | Admitting: Dietician

## 2017-08-14 ENCOUNTER — Encounter: Payer: Self-pay | Admitting: Dietician

## 2017-08-14 ENCOUNTER — Encounter: Payer: Medicare Other | Attending: Unknown Physician Specialty | Admitting: Dietician

## 2017-08-14 VITALS — Ht 64.0 in | Wt 232.5 lb

## 2017-08-14 DIAGNOSIS — E119 Type 2 diabetes mellitus without complications: Secondary | ICD-10-CM | POA: Diagnosis not present

## 2017-08-14 DIAGNOSIS — Z713 Dietary counseling and surveillance: Secondary | ICD-10-CM | POA: Insufficient documentation

## 2017-08-14 DIAGNOSIS — Z6841 Body Mass Index (BMI) 40.0 and over, adult: Secondary | ICD-10-CM

## 2017-08-14 NOTE — Patient Instructions (Signed)
   Try short exercise/ walking times to feel more motivated to do it, perhaps in mornings or evenings.   Use options provided for healthy snack ideas.  Look up restaurant or other meals or foods using [email or web address]. Consider entering your foods into a website or phone app such as MyFitnessPal or LoseIt, for better feedback on calories, carbs, fat, etc.

## 2017-08-14 NOTE — Progress Notes (Signed)
Medical Nutrition Therapy: Visit start time: 1330  end time: 1430  Assessment:  Diagnosis: diabetes, obesity Medical history changes: no changes per patient Psychosocial issues/ stress concerns: none  Current weight: 232.5lbs  Height: 5'4" Medications, supplement changes: no changes per patient  Progress and evaluation: Weight loss of 2.6lbs since 05/22/17. Patient reports losing some motivation to continue with healthy diet and lifestyle. She feels unmotivated to exercise due to hot weather; states she does not perspire easily and gets very hot and increased heart rate when spending time outdoors.    Physical activity: no structured activity, occasional gardening  Dietary Intake:  Usual eating pattern includes 3 meals and 1-2 snacks per day. Dining out frequency: 2-3 meals per week.  Breakfast: usually fruit only (eats late); sundays [place] english muffin with egg and tomato; occasionally small pancakes with small amount jam, no syrup + eggs Snack: none Lunch: salad with grilled chicken or salmon; sandwich Snack: sometimes nuts or fig newton cookies Supper: chicken quesadilla with chicken, rice.  Snack: 2 graham crackers, sometimes with peanut butter if particularly hungry Beverages: water, occasional diet soda  Nutrition Care Education: Topics covered: weight management, diabetes    Weight control: discussed strategies to avoid feelings of frustration or decrease motivation: controlling calories by either controlling portions especially with higher calorie foods, choosing lower fat and sugar options more often, choosing high fat and sugar foods less often and replacing with another food altogether; benefits of regular exercise, allowing for occasional planned "treats". Discussed snack options, including lower calorie/ sugar options for ice cream. Reviewed food diary which indicates attention to portions and generally healthy food choices; some fried foods and 1-2 sweets most days.   Advanced nutrition: dining out-- encouraged looking up nutrition info online and adjusting choices accordingly, food label reading-- sugar vs added sugar, total carbohydrate Diabetes:  Options for lower-sugar snacks/ desserts Other: Encouraged short duration exercise during early morning or late evening to avoid daytime heat; also encouraged patient to discuss feeling overheated easily with MD.   Nutritional Diagnosis:  [place]-2.2 Altered nutrition-related laboratory As related to diabetes.  As evidenced by HbA1C 6.5%. [place]-3.3 Overweight/obesity As related to excess calories, inactivity.  As evidenced by BMI 40.  Intervention: Discussion as noted above.   Encouraged manageable changes and relying on positive support, such as daughters.   Updated goals with direction from patient.   Patient requested follow-up in early November.  Education Materials given:  . Recipes-- 5-ingredient granola bars . Snacking handout . Goals/ instructions   Learner/ who was taught:  . Patient  . Family member: daughter Brendy  Level of understanding: Marland Kitchen Verbalizes/ demonstrates competency  Demonstrated degree of understanding via:   Teach back Learning barriers: . None  Willingness to learn/ readiness for change: . Eager, change in progress  Monitoring and Evaluation:  Dietary intake, exercise, BG control, and body weight      follow up: 10/30/17

## 2017-08-22 IMAGING — MG MM DIGITAL DIAGNOSTIC UNILAT*R* W/ TOMO W/ CAD
6 series · 6 of 14 positions shown · non-contrast
Comparison: Previous exam(s).

CLINICAL DATA: The patient was called back from screening
mammography due to asymmetries in the inferior right breast.

EXAM:
2D DIGITAL DIAGNOSTIC RIGHT MAMMOGRAM WITH ADJUNCT TOMO
ULTRASOUND RIGHT BREAST

[R MLO synth-2D]
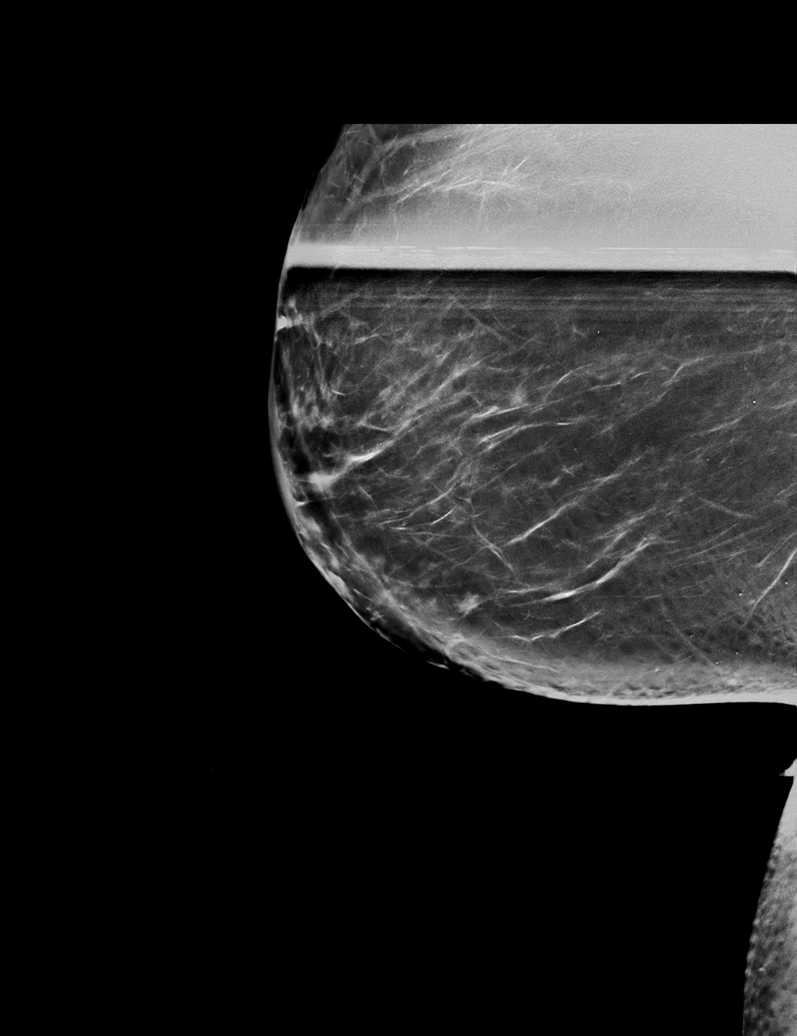

[R CC]
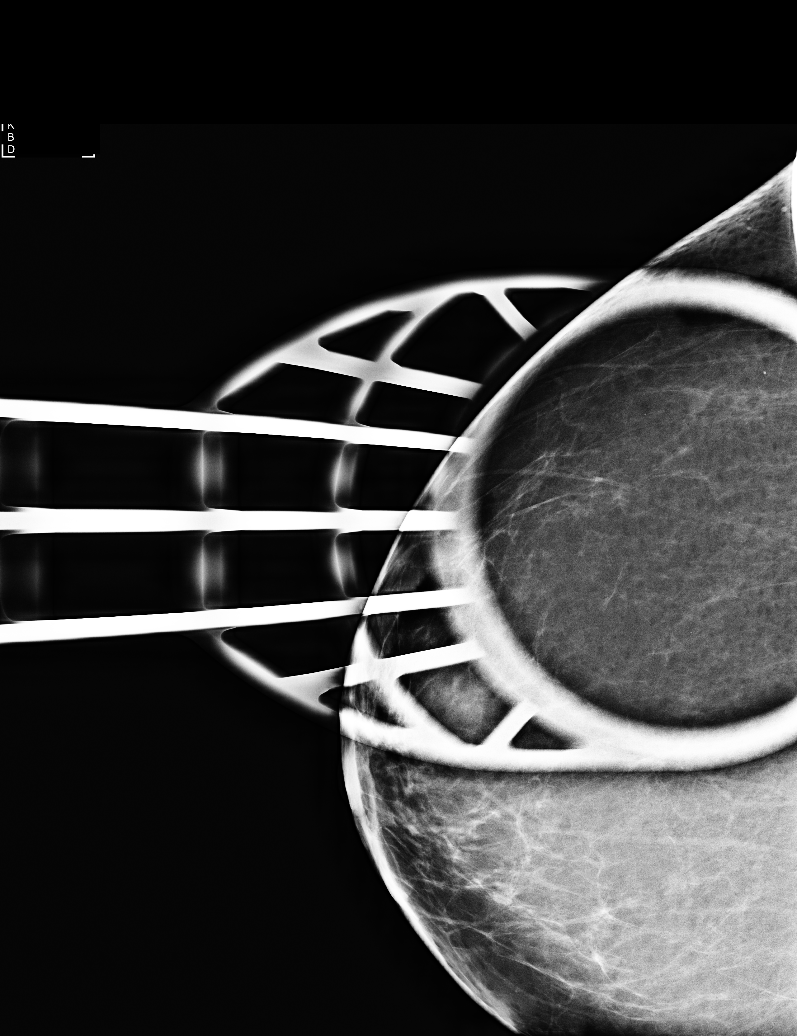

[R MLO]
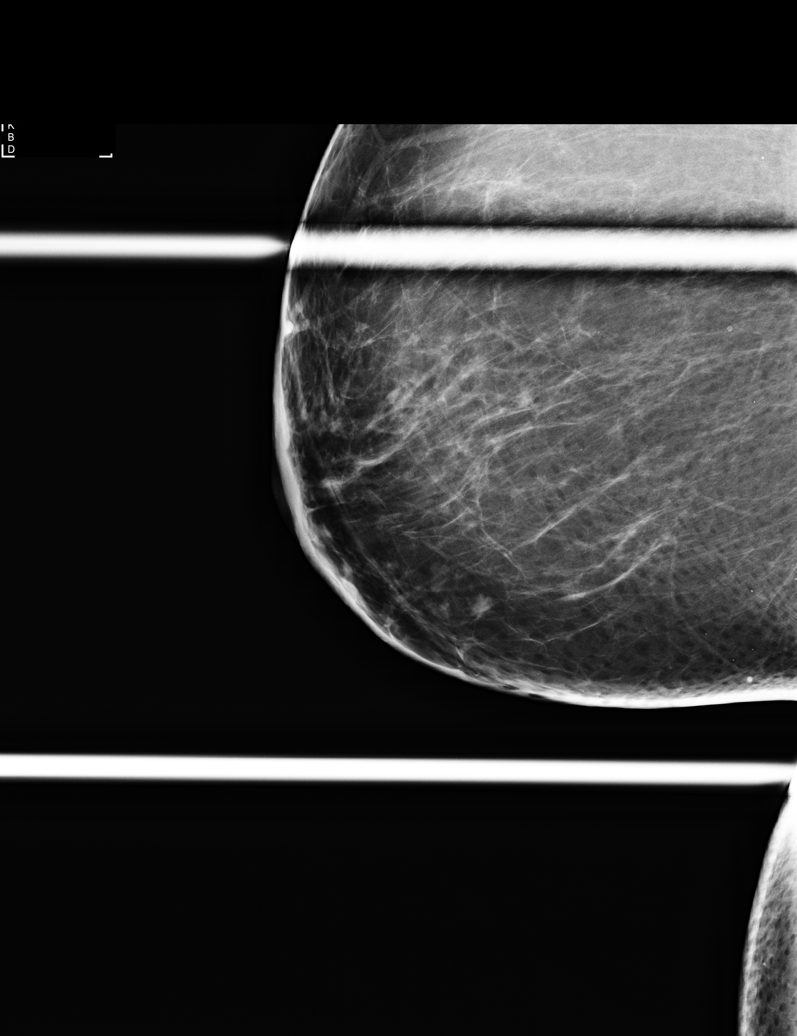

[R CC synth-2D]
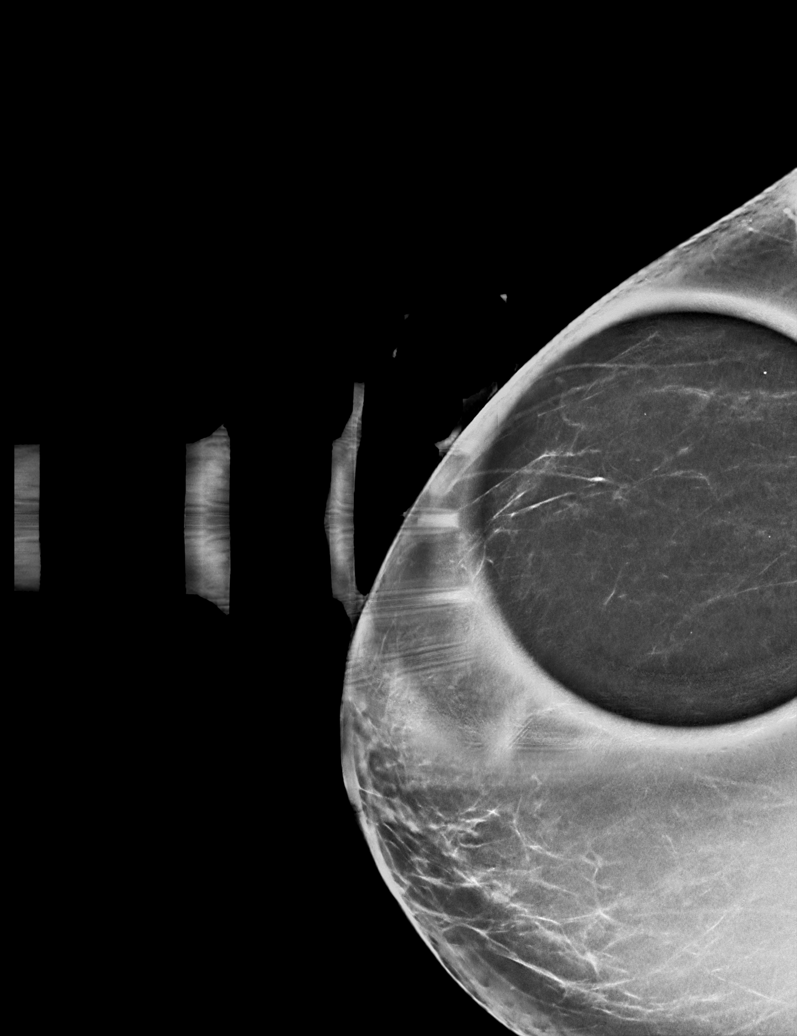

[R MLO tomo · tomo slice 44/87.0]
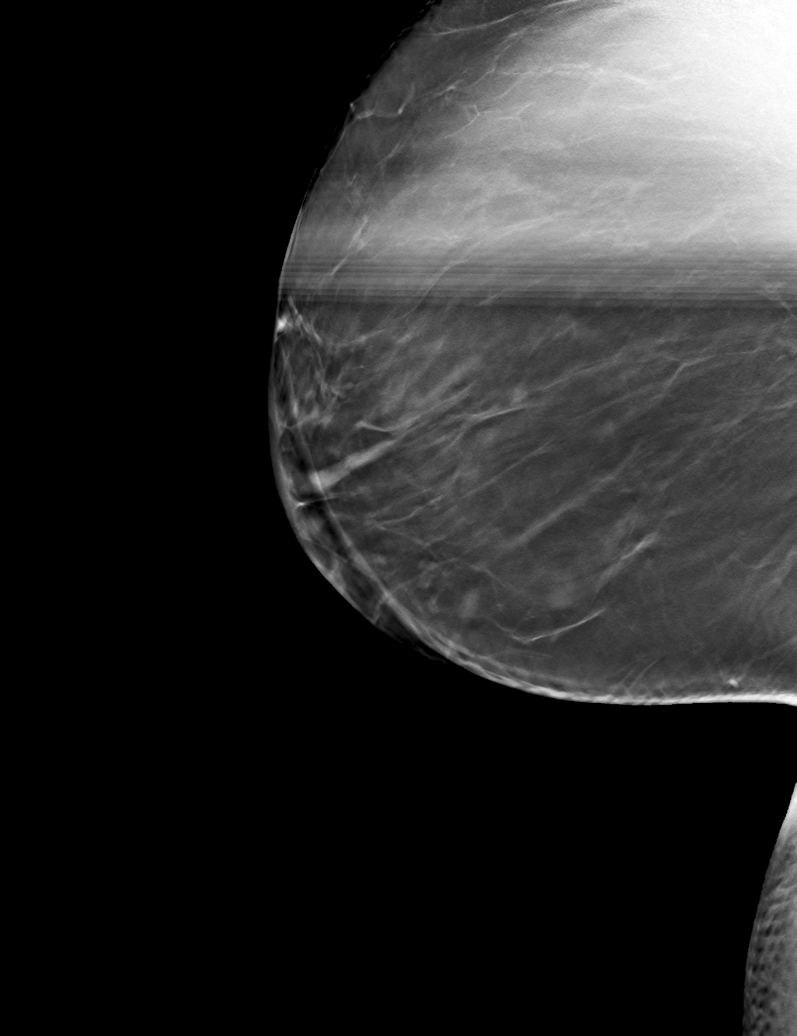

[R CC tomo · tomo slice 37/72.0]
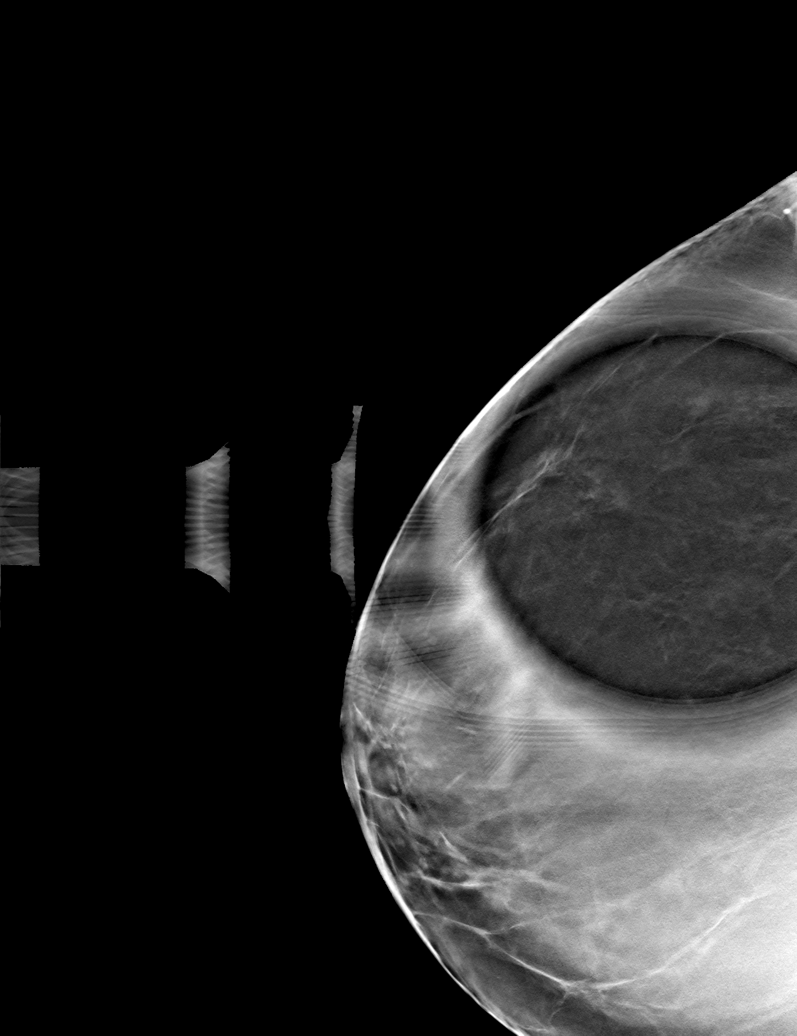

[6 of 14 positions shown; findings below may reference images not displayed]

ACR Breast Density Category b: There are scattered areas of
fibroglandular density.
FINDINGS: Two asymmetries were originally seen in the inferior right breast,
thought to be laterally located. No abnormalities are seen laterally
on today's imaging. The more posterior of the 2 asymmetries on the
MLO view resolves into tissue on today's study. The more anterior
asymmetry does not completely resolve but is not definitively
different since Sunday March, 2014.

On physical exam, no suspicious lumps identified.

Targeted ultrasound is performed, showing a hypoechoic mass at 7
o'clock, 4 cm from the nipple measuring 2 x 3 x 1.3 mm. This may
correlate with the mammographic finding.
IMPRESSION: Probably benign mass in the inferior right breast seen on the MLO
view. This may correlate with a hypoechoic mass seen sonographically
at 7 o'clock which may represent a small complicated cyst. It is
unclear that the findings on the recent screening mammogram are
changed since Sunday March, 2014.

RECOMMENDATION:
Six-month follow-up mammogram and ultrasound of the right breast to
ensure stability of the probably benign mass.

I have discussed the findings and recommendations with the patient.
Results were also provided in writing at the conclusion of the
visit. If applicable, a reminder letter will be sent to the patient
regarding the next appointment.

BI-RADS CATEGORY  3: Probably benign.

## 2017-10-10 ENCOUNTER — Encounter: Payer: Self-pay | Admitting: Dietician

## 2017-10-10 NOTE — Progress Notes (Signed)
Patient cancelled her appointment for 10/30/17, and does not wish to reschedule at this time. Sent discharge letter to referring provider.

## 2017-10-30 ENCOUNTER — Ambulatory Visit: Payer: Medicare Other | Admitting: Dietician

## 2017-11-03 ENCOUNTER — Encounter: Payer: Self-pay | Admitting: Unknown Physician Specialty

## 2017-11-03 ENCOUNTER — Ambulatory Visit: Payer: Medicare Other | Admitting: Unknown Physician Specialty

## 2017-11-03 VITALS — BP 138/81 | HR 71 | Temp 98.0°F | Ht 63.8 in | Wt 228.1 lb

## 2017-11-03 DIAGNOSIS — Z23 Encounter for immunization: Secondary | ICD-10-CM | POA: Diagnosis not present

## 2017-11-03 DIAGNOSIS — R928 Other abnormal and inconclusive findings on diagnostic imaging of breast: Secondary | ICD-10-CM

## 2017-11-03 DIAGNOSIS — E785 Hyperlipidemia, unspecified: Secondary | ICD-10-CM

## 2017-11-03 DIAGNOSIS — Z0000 Encounter for general adult medical examination without abnormal findings: Secondary | ICD-10-CM

## 2017-11-03 DIAGNOSIS — I10 Essential (primary) hypertension: Secondary | ICD-10-CM | POA: Diagnosis not present

## 2017-11-03 DIAGNOSIS — K219 Gastro-esophageal reflux disease without esophagitis: Secondary | ICD-10-CM | POA: Insufficient documentation

## 2017-11-03 DIAGNOSIS — Z6841 Body Mass Index (BMI) 40.0 and over, adult: Secondary | ICD-10-CM

## 2017-11-03 DIAGNOSIS — G4733 Obstructive sleep apnea (adult) (pediatric): Secondary | ICD-10-CM

## 2017-11-03 DIAGNOSIS — Z7189 Other specified counseling: Secondary | ICD-10-CM | POA: Insufficient documentation

## 2017-11-03 DIAGNOSIS — R7301 Impaired fasting glucose: Secondary | ICD-10-CM

## 2017-11-03 LAB — HGB A1C W/O EAG: Hgb A1c MFr Bld: 5.9 % — ABNORMAL HIGH (ref 4.8–5.6)

## 2017-11-03 NOTE — Assessment & Plan Note (Signed)
Check Lipid panel 

## 2017-11-03 NOTE — Assessment & Plan Note (Signed)
Stable, continue present medications.   

## 2017-11-03 NOTE — Assessment & Plan Note (Signed)
A voluntary discussion about advance care planning including the explanation and discussion of advance directives was extensively discussed  with the patient.  Explanation about the health care proxy and Living will was reviewed and has the forms.  During this discussion, the patient was able to identify a health care proxy as her daughters.  She will bring the forms in.   Patient was offered a separate [place] visit for further assistance with forms.

## 2017-11-03 NOTE — Patient Instructions (Addendum)
Influenza (Flu) Vaccine (Inactivated or Recombinant): What You Need to Know 1. Why get vaccinated? Influenza ("flu") is a contagious disease that spreads around the [place] every year, usually between October and May. Flu is caused by influenza viruses, and is spread mainly by coughing, sneezing, and close contact. Anyone can get flu. Flu strikes suddenly and can last several days. Symptoms vary by age, but can include:  fever/chills  sore throat  muscle aches  fatigue  cough  headache  runny or stuffy nose  Flu can also lead to pneumonia and blood infections, and cause diarrhea and seizures in children. If you have a medical condition, such as heart or lung disease, flu can make it worse. Flu is more dangerous for some people. Infants and young children, people 23 years of age and older, pregnant women, and people with certain health conditions or a weakened immune system are at greatest risk. Each year thousands of people in the [place] die from flu, and many more are hospitalized. Flu vaccine can:  keep you from getting flu,  make flu less severe if you do get it, and  keep you from spreading flu to your family and other people. 2. Inactivated and recombinant flu vaccines A dose of flu vaccine is recommended every flu season. Children 6 months through 91 years of age may need two doses during the same flu season. Everyone else needs only one dose each flu season. Some inactivated flu vaccines contain a very small amount of a mercury-based preservative called thimerosal. Studies have not shown thimerosal in vaccines to be harmful, but flu vaccines that do not contain thimerosal are available. There is no live flu virus in flu shots. They cannot cause the flu. There are many flu viruses, and they are always changing. Each year a new flu vaccine is made to protect against three or four viruses that are likely to cause disease in the upcoming flu season. But even when the  vaccine doesn't exactly match these viruses, it may still provide some protection. Flu vaccine cannot prevent:  flu that is caused by a virus not covered by the vaccine, or  illnesses that look like flu but are not.  It takes about 2 weeks for protection to develop after vaccination, and protection lasts through the flu season. 3. Some people should not get this vaccine Tell the person who is giving you the vaccine:  If you have any severe, life-threatening allergies. If you ever had a life-threatening allergic reaction after a dose of flu vaccine, or have a severe allergy to any part of this vaccine, you may be advised not to get vaccinated. Most, but not all, types of flu vaccine contain a small amount of egg protein.  If you ever had Guillain-Barr Syndrome (also called GBS). Some people with a history of GBS should not get this vaccine. This should be discussed with your doctor.  If you are not feeling well. It is usually okay to get flu vaccine when you have a mild illness, but you might be asked to come back when you feel better.  4. Risks of a vaccine reaction With any medicine, including vaccines, there is a chance of reactions. These are usually mild and go away on their own, but serious reactions are also possible. Most people who get a flu shot do not have any problems with it. Minor problems following a flu shot include:  soreness, redness, or swelling where the shot was given  hoarseness  sore,  red or itchy eyes  cough  fever  aches  headache  itching  fatigue  If these problems occur, they usually begin soon after the shot and last 1 or 2 days. More serious problems following a flu shot can include the following:  There may be a small increased risk of Guillain-Barre Syndrome (GBS) after inactivated flu vaccine. This risk has been estimated at 1 or 2 additional cases per million people vaccinated. This is much lower than the risk of severe complications from  flu, which can be prevented by flu vaccine.  Young children who get the flu shot along with pneumococcal vaccine (PCV13) and/or DTaP vaccine at the same time might be slightly more likely to have a seizure caused by fever. Ask your doctor for more information. Tell your doctor if a child who is getting flu vaccine has ever had a seizure.  Problems that could happen after any injected vaccine:  People sometimes faint after a medical procedure, including vaccination. Sitting or lying down for about 15 minutes can help prevent fainting, and injuries caused by a fall. Tell your doctor if you feel dizzy, or have vision changes or ringing in the ears.  Some people get severe pain in the shoulder and have difficulty moving the arm where a shot was given. This happens very rarely.  Any medication can cause a severe allergic reaction. Such reactions from a vaccine are very rare, estimated at about 1 in a million doses, and would happen within a few minutes to a few hours after the vaccination. As with any medicine, there is a very remote chance of a vaccine causing a serious injury or death. The safety of vaccines is always being monitored. For more information, visit: http://www.aguilar.org/ 5. What if there is a serious reaction? What should I look for? Look for anything that concerns you, such as signs of a severe allergic reaction, very high fever, or unusual behavior. Signs of a severe allergic reaction can include hives, swelling of the face and throat, difficulty breathing, a fast heartbeat, dizziness, and weakness. These would start a few minutes to a few hours after the vaccination. What should I do?  If you think it is a severe allergic reaction or other emergency that can't wait, call 9-1-1 and get the person to the nearest hospital. Otherwise, call your doctor.  Reactions should be reported to the Vaccine Adverse Event Reporting System (VAERS). Your doctor should file this report, or you  can do it yourself through the VAERS web site at www.vaers.SamedayNews.es, or by calling 6094730752. ? VAERS does not give medical advice. 6. The National Vaccine Injury Compensation Program The [employer] Vaccine Injury Compensation Program (VICP) is a federal program that was created to compensate people who may have been injured by certain vaccines. Persons who believe they may have been injured by a vaccine can learn about the program and about filing a claim by calling 458-267-6070 or visiting the [place] website at GoldCloset.com.ee. There is a time limit to file a claim for compensation. 7. How can I learn more?  Ask your healthcare provider. He or she can give you the vaccine package insert or suggest other sources of information.  Call your local or state health department.  Contact the Centers for Disease Control and Prevention (CDC): ? Call (540)164-9661 (1-800-CDC-INFO) or ? Visit CDC's website at https://gibson.com/ Vaccine Information Statement, Inactivated Influenza Vaccine (07/29/2014) This information is not intended to replace advice given to you by your health care provider. Make sure  you discuss any questions you have with your health care provider. Document Released: 10/03/2006 Document Revised: 08/29/2016 Document Reviewed: 08/29/2016 Elsevier Interactive Patient Education  2017 [employer] Pneumococcal Conjugate Vaccine (PCV13) What You Need to Know 1. Why get vaccinated? Vaccination can protect both children and adults from pneumococcal disease. Pneumococcal disease is caused by bacteria that can spread from person to person through close contact. It can cause ear infections, and it can also lead to more serious infections of the:  Lungs (pneumonia),  Blood (bacteremia), and  Covering of the brain and spinal cord (meningitis).  Pneumococcal pneumonia is most common among adults. Pneumococcal meningitis can cause deafness and brain damage, and it kills  about 1 child in 10 who get it. Anyone can get pneumococcal disease, but children under 74 years of age and adults 55 years and older, people with certain medical conditions, and cigarette smokers are at the highest risk. Before there was a vaccine, the [place] saw:  more than 700 cases of meningitis,  about 13,000 blood infections,  about 5 million ear infections, and  about 200 deaths  in children under 5 each year from pneumococcal disease. Since vaccine became available, severe pneumococcal disease in these children has fallen by 88%. About 18,000 older adults die of pneumococcal disease each year in the [place]. Treatment of pneumococcal infections with penicillin and other drugs is not as effective as it used to be, because some strains of the disease have become resistant to these drugs. This makes prevention of the disease, through vaccination, even more important. 2. PCV13 vaccine Pneumococcal conjugate vaccine (called PCV13) protects against 13 types of pneumococcal bacteria. PCV13 is routinely given to children at 2, 4, 6, and 19-43 months of age. It is also recommended for children and adults 18 to 57 years of age with certain health conditions, and for all adults 36 years of age and older. Your doctor can give you details. 3. Some people should not get this vaccine Anyone who has ever had a life-threatening allergic reaction to a dose of this vaccine, to an earlier pneumococcal vaccine called PCV7, or to any vaccine containing diphtheria toxoid (for example, DTaP), should not get PCV13. Anyone with a severe allergy to any component of PCV13 should not get the vaccine. Tell your doctor if the person being vaccinated has any severe allergies. If the person scheduled for vaccination is not feeling well, your healthcare provider might decide to reschedule the shot on another day. 4. Risks of a vaccine reaction With any medicine, including vaccines, there is a chance of  reactions. These are usually mild and go away on their own, but serious reactions are also possible. Problems reported following PCV13 varied by age and dose in the series. The most common problems reported among children were:  About half became drowsy after the shot, had a temporary loss of appetite, or had redness or tenderness where the shot was given.  About 1 out of 3 had swelling where the shot was given.  About 1 out of 3 had a mild fever, and about 1 in 20 had a fever over 102.70F.  Up to about 8 out of 10 became fussy or irritable.  Adults have reported pain, redness, and swelling where the shot was given; also mild fever, fatigue, headache, chills, or muscle pain. Young children who get PCV13 along with inactivated flu vaccine at the same time may be at increased risk for seizures caused by fever. Ask your doctor for more  information. Problems that could happen after any vaccine:  People sometimes faint after a medical procedure, including vaccination. Sitting or lying down for about 15 minutes can help prevent fainting, and injuries caused by a fall. Tell your doctor if you feel dizzy, or have vision changes or ringing in the ears.  Some older children and adults get severe pain in the shoulder and have difficulty moving the arm where a shot was given. This happens very rarely.  Any medication can cause a severe allergic reaction. Such reactions from a vaccine are very rare, estimated at about 1 in a million doses, and would happen within a few minutes to a few hours after the vaccination. As with any medicine, there is a very small chance of a vaccine causing a serious injury or death. The safety of vaccines is always being monitored. For more information, visit: http://www.aguilar.org/ 5. What if there is a serious reaction? What should I look for? Look for anything that concerns you, such as signs of a severe allergic reaction, very high fever, or unusual behavior. Signs of  a severe allergic reaction can include hives, swelling of the face and throat, difficulty breathing, a fast heartbeat, dizziness, and weakness-usually within a few minutes to a few hours after the vaccination. What should I do?  If you think it is a severe allergic reaction or other emergency that can't wait, call 9-1-1 or get the person to the nearest hospital. Otherwise, call your doctor.  Reactions should be reported to the Vaccine Adverse Event Reporting System (VAERS). Your doctor should file this report, or you can do it yourself through the VAERS web site at www.vaers.SamedayNews.es, or by calling 856-086-1038. ? VAERS does not give medical advice. 6. The National Vaccine Injury Compensation Program The [employer] Vaccine Injury Compensation Program (VICP) is a federal program that was created to compensate people who may have been injured by certain vaccines. Persons who believe they may have been injured by a vaccine can learn about the program and about filing a claim by calling 781-376-2957 or visiting the [place] website at GoldCloset.com.ee. There is a time limit to file a claim for compensation. 7. How can I learn more?  Ask your healthcare provider. He or she can give you the vaccine package insert or suggest other sources of information.  Call your local or state health department.  Contact the Centers for Disease Control and Prevention (CDC): ? Call (364)822-8832 (1-800-CDC-INFO) or ? Visit CDC's website at http://hunter.com/ Vaccine Information Statement, PCV13 Vaccine (10/27/2014) This information is not intended to replace advice given to you by your health care provider. Make sure you discuss any questions you have with your health care provider. -------------------------------------------------------------------- [facility] Preventive Care 66 Years and Older, Female Preventive care refers to lifestyle choices and visits with your health care provider  that can promote health and wellness. What does preventive care include?  A yearly physical exam. This is also called an annual well check.  Dental exams once or twice a year.  Routine eye exams. Ask your health care provider how often you should have your eyes checked.  Personal lifestyle choices, including: ? Daily care of your teeth and gums. ? Regular physical activity. ? Eating a healthy diet. ? Avoiding tobacco and drug use. ? Limiting alcohol use. ? Practicing safe sex. ? Taking low-dose aspirin every day. ? Taking vitamin and mineral supplements as recommended by your health care provider. What happens during an annual well check? The services and screenings done  by your health care provider during your annual well check will depend on your age, overall health, lifestyle risk factors, and family history of disease. Counseling Your health care provider may ask you questions about your:  Alcohol use.  Tobacco use.  Drug use.  Emotional well-being.  Home and relationship well-being.  Sexual activity.  Eating habits.  History of falls.  Memory and ability to understand (cognition).  Work and work Statistician.  Reproductive health.  Screening You may have the following tests or measurements:  Height, weight, and BMI.  Blood pressure.  Lipid and cholesterol levels. These may be checked every 5 years, or more frequently if you are over 19 years old.  Skin check.  Lung cancer screening. You may have this screening every year starting at age 15 if you have a 30-pack-year history of smoking and currently smoke or have quit within the past 15 years.  Fecal occult blood test (FOBT) of the stool. You may have this test every year starting at age 26.  Flexible sigmoidoscopy or colonoscopy. You may have a sigmoidoscopy every 5 years or a colonoscopy every 10 years starting at age 81.  Hepatitis C blood test.  Hepatitis B blood test.  Sexually transmitted  disease (STD) testing.  Diabetes screening. This is done by checking your blood sugar (glucose) after you have not eaten for a while (fasting). You may have this done every 1-3 years.  Bone density scan. This is done to screen for osteoporosis. You may have this done starting at age 77.  Mammogram. This may be done every 1-2 years. Talk to your health care provider about how often you should have regular mammograms.  Talk with your health care provider about your test results, treatment options, and if necessary, the need for more tests. Vaccines Your health care provider may recommend certain vaccines, such as:  Influenza vaccine. This is recommended every year.  Tetanus, diphtheria, and acellular pertussis (Tdap, Td) vaccine. You may need a Td booster every 10 years.  Varicella vaccine. You may need this if you have not been vaccinated.  Zoster vaccine. You may need this after age 61.  Measles, mumps, and rubella (MMR) vaccine. You may need at least one dose of MMR if you were born in 1957 or later. You may also need a second dose.  Pneumococcal 13-valent conjugate (PCV13) vaccine. One dose is recommended after age 41.  Pneumococcal polysaccharide (PPSV23) vaccine. One dose is recommended after age 28.  Meningococcal vaccine. You may need this if you have certain conditions.  Hepatitis A vaccine. You may need this if you have certain conditions or if you travel or work in places where you may be exposed to hepatitis A.  Hepatitis B vaccine. You may need this if you have certain conditions or if you travel or work in places where you may be exposed to hepatitis B.  Haemophilus influenzae type b (Hib) vaccine. You may need this if you have certain conditions.  Talk to your health care provider about which screenings and vaccines you need and how often you need them. This information is not intended to replace advice given to you by your health care provider. Make sure you discuss  any questions you have with your health care provider. Document Released: 01/05/2016 Document Revised: 08/28/2016 Document Reviewed: 10/10/2015 Elsevier Interactive Patient Education  2017 [employer].

## 2017-11-03 NOTE — Assessment & Plan Note (Signed)
Using nightly

## 2017-11-03 NOTE — Assessment & Plan Note (Signed)
Thinking of joining [employer] again.  Discussed [employer]

## 2017-11-03 NOTE — Progress Notes (Signed)
BP 138/81   Pulse 71   Temp 98 F (36.7 C) (Oral)   Ht 5' 3.8" (1.621 m)   Wt 228 lb 1.6 oz (103.5 kg)   LMP  (LMP Unknown)   SpO2 98%   BMI 39.40 kg/m    Subjective:    Patient ID: Barbara Fitzpatrick, female    DOB: June 06, 1951, 66 y.o.   MRN: [id number]  HPI: Barbara Fitzpatrick is a 66 y.o. female  Chief Complaint  Patient presents with  . Medicare Wellness   Functional Status Survey: Is the patient deaf or have difficulty hearing?: No Does the patient have difficulty seeing, even when wearing glasses/contacts?: No Does the patient have difficulty concentrating, remembering, or making decisions?: No Does the patient have difficulty walking or climbing stairs?: No Does the patient have difficulty dressing or bathing?: No Does the patient have difficulty doing errands alone such as visiting a doctor's office or shopping?: No Fall Risk  11/03/2017 08/14/2017 05/22/2017 04/10/2017 09/18/2016  Falls in the past year? No No No No No   Depression screen [facility] 2/9 11/03/2017 04/10/2017 09/18/2016  Decreased Interest 0 0 1  Down, Depressed, Hopeless 1 1 1   PHQ - 2 Score 1 1 2   Altered sleeping 0 - 1  Tired, decreased energy 0 - 2  Change in appetite 0 - 2  Feeling bad or failure about yourself  1 - 1  Trouble concentrating 0 - 0  Moving slowly or fidgety/restless 0 - 0  Suicidal thoughts 0 - 1  PHQ-9 Score 2 - 9   Hypertension Using medications without difficulty Average home BPs   No problems or lightheadedness No chest pain with exertion or shortness of breath No Edema  Hyperlipidemia Using medications without problems: No Muscle aches  Diet compliance:Exercise:Not currently exercises.  [facility]  GERD This is stable on Omeprazole                   Social History   Socioeconomic History  . Marital status: Married    Spouse name: Not on file  . Number of children: Not on file  . Years of education: Not on file  . Highest education level: Not on file    Social Needs  . Financial resource strain: Not on file  . Food insecurity - worry: Not on file  . Food insecurity - inability: Not on file  . Transportation needs - medical: Not on file  . Transportation needs - non-medical: Not on file  Occupational History  . Not on file  Tobacco Use  . Smoking status: Never Smoker  . Smokeless tobacco: Never Used  Substance and Sexual Activity  . Alcohol use: No  . Drug use: No  . Sexual activity: No  Other Topics Concern  . Not on file  Social History Narrative  . Not on file    Past Medical History:  Diagnosis Date  . Arthritis   . Depression   . GERD (gastroesophageal reflux disease)   . Hyperlipidemia   . Hypertension   . Personal history of colonic adenomas 06/16/2013   4 adenomas (max 1 cm)  04/2003  no polyps 04/2005   . Sleep apnea    uses c-pap   Past Surgical History:  Procedure Laterality Date  . COLONOSCOPY    . hysterectomy     with bladder tack  . TUBAL LIGATION     Family History  Problem Relation Age of Onset  . Hypertension Mother   .  Hyperlipidemia Mother   . Diabetes Mother   . Cancer Mother        colon (benign)  . Glaucoma Mother   . Heart disease Father   . Hypertension Father   . Hyperlipidemia Father   . Diabetes Father   . Diabetes Sister   . Lung disease Sister        polyps  . Cancer Brother        kidney  . Glaucoma Brother   . Hyperlipidemia Brother   . Hypertension Brother   . Stroke Maternal Grandfather   . Diabetes Paternal Grandmother   . Heart disease Paternal Grandfather   . Cancer Maternal Aunt 50       ovarian  . Colon cancer Neg Hx   . Esophageal cancer Neg Hx   . Rectal cancer Neg Hx   . Stomach cancer Neg Hx    .  Relevant past medical, surgical, family and social history reviewed and updated as indicated. Interim medical history since our last visit reviewed. Allergies and medications reviewed and updated.  Review of Systems  Per HPI unless specifically indicated  above     Objective:    BP 138/81   Pulse 71   Temp 98 F (36.7 C) (Oral)   Ht 5' 3.8" (1.621 m)   Wt 228 lb 1.6 oz (103.5 kg)   LMP  (LMP Unknown)   SpO2 98%   BMI 39.40 kg/m   Wt Readings from Last 3 Encounters:  11/03/17 228 lb 1.6 oz (103.5 kg)  08/14/17 232 lb 8 oz (105.5 kg)  07/01/17 229 lb (103.9 kg)    Physical Exam  Constitutional: She is oriented to person, place, and time. She appears well-developed and well-nourished.  HENT:  Head: Normocephalic and atraumatic.  Eyes: Pupils are equal, round, and reactive to light. Right eye exhibits no discharge. Left eye exhibits no discharge. No scleral icterus.  Neck: Normal range of motion. Neck supple. Carotid bruit is not present. No thyromegaly present.  Cardiovascular: Normal rate, regular rhythm and normal heart sounds. Exam reveals no gallop and no friction rub.  No murmur heard. Pulmonary/Chest: Effort normal and breath sounds normal. No respiratory distress. She has no wheezes. She has no rales.  Abdominal: Soft. Bowel sounds are normal. There is no tenderness. There is no rebound.  Genitourinary: No breast swelling, tenderness or discharge.  Musculoskeletal: Normal range of motion.  Lymphadenopathy:    She has no cervical adenopathy.  Neurological: She is alert and oriented to person, place, and time.  Skin: Skin is warm, dry and intact. No rash noted.  Psychiatric: She has a normal mood and affect. Her speech is normal and behavior is normal. Judgment and thought content normal. Cognition and memory are normal.   EKG normal sinus rhythm.  No acute or chronic  changes    Assessment & Plan:   Problem List Items Addressed This Visit      Unprioritized   Abnormal mammogram of right breast    Area of concern has been there since 1978.  Getting a f/u mammogram      Advanced care planning/counseling discussion    A voluntary discussion about advance care planning including the explanation and discussion of  advance directives was extensively discussed  with the patient.  Explanation about the health care proxy and Living will was reviewed and has the forms.  During this discussion, the patient was able to identify a health care proxy as her daughters.  She will  bring the forms in.   Patient was offered a separate [facility] visit for further assistance with forms.         Benign hypertension    Stable, continue present medications.        Relevant Orders   Comprehensive metabolic panel   Lipid Panel w/o Chol/HDL Ratio   GERD (gastroesophageal reflux disease)    Stable, continue present medications.        Hyperlipemia    Check Lipid panel      Impaired fasting glucose    Check Hgb A1C      Relevant Orders   Hgb A1c w/o eAG   Obesity    Thinking of joining [employer] again.  Discussed Weight Watchers      Sleep apnea    Using nightly       Other Visit Diagnoses    Need for influenza vaccination    -  Primary   Relevant Orders   Flu vaccine HIGH DOSE PF   Need for pneumococcal vaccination       Relevant Orders   Pneumococcal conjugate vaccine 13-valent IM (Completed)   Initial Medicare annual wellness visit       Relevant Orders   EKG 12-Lead (Completed)       Follow up plan: Return in about 6 months (around 05/03/2018).

## 2017-11-03 NOTE — Assessment & Plan Note (Signed)
Check Hgb A1C 

## 2017-11-03 NOTE — Assessment & Plan Note (Signed)
Area of concern has been there since 1978.  Getting a f/u mammogram

## 2017-11-04 LAB — COMPREHENSIVE METABOLIC PANEL
ALBUMIN: 4.2 g/dL (ref 3.6–4.8)
ALK PHOS: 89 IU/L (ref 39–117)
ALT: 36 IU/L — AB (ref 0–32)
AST: 24 IU/L (ref 0–40)
Albumin/Globulin Ratio: 1.5 (ref 1.2–2.2)
BILIRUBIN TOTAL: 0.6 mg/dL (ref 0.0–1.2)
BUN/Creatinine Ratio: 13 (ref 12–28)
BUN: 12 mg/dL (ref 8–27)
CO2: 27 mmol/L (ref 20–29)
CREATININE: 0.89 mg/dL (ref 0.57–1.00)
Calcium: 10.5 mg/dL — ABNORMAL HIGH (ref 8.7–10.3)
Chloride: 99 mmol/L (ref 96–106)
GFR, EST AFRICAN AMERICAN: 79 mL/min/{1.73_m2} (ref >59)
GFR, EST NON AFRICAN AMERICAN: 68 mL/min/{1.73_m2} (ref >59)
GLOBULIN, TOTAL: 2.8 g/dL (ref 1.5–4.5)
Glucose: 121 mg/dL — ABNORMAL HIGH (ref 65–99)
Potassium: 3.8 mmol/L (ref 3.5–5.2)
Sodium: 140 mmol/L (ref 134–144)
Total Protein: 7 g/dL (ref 6.0–8.5)

## 2017-11-04 LAB — LIPID PANEL W/O CHOL/HDL RATIO
Cholesterol, Total: 160 mg/dL (ref 100–199)
HDL: 36 mg/dL — AB (ref >39)
LDL CALC: 86 mg/dL (ref 0–99)
TRIGLYCERIDES: 190 mg/dL — AB (ref 0–149)
VLDL CHOLESTEROL CAL: 38 mg/dL (ref 5–40)

## 2017-11-04 NOTE — Progress Notes (Signed)
Normal labs.  Pt notified through mychart

## 2017-11-26 ENCOUNTER — Other Ambulatory Visit: Payer: Self-pay

## 2017-11-26 MED ORDER — QUINAPRIL HCL 40 MG PO TABS: 40.0000 mg | ORAL_TABLET | Freq: Every day | ORAL | 6 refills | Status: DC

## 2017-11-26 NOTE — Telephone Encounter (Signed)
Patient last seen 11/03/17 for physical.

## 2017-12-26 ENCOUNTER — Other Ambulatory Visit: Payer: Self-pay

## 2017-12-26 MED ORDER — ATORVASTATIN CALCIUM 40 MG PO TABS: 40.0000 mg | ORAL_TABLET | Freq: Every day | ORAL | 6 refills | Status: DC

## 2017-12-26 NOTE — Telephone Encounter (Signed)
Patient last seen 11/03/17.

## 2018-01-20 ENCOUNTER — Encounter: Payer: Self-pay | Admitting: Family Medicine

## 2018-01-20 ENCOUNTER — Ambulatory Visit: Payer: Medicare Other | Admitting: Family Medicine

## 2018-01-20 VITALS — BP 134/83 | HR 70 | Temp 98.4°F | Wt 224.1 lb

## 2018-01-20 DIAGNOSIS — B001 Herpesviral vesicular dermatitis: Secondary | ICD-10-CM | POA: Diagnosis not present

## 2018-01-20 DIAGNOSIS — J40 Bronchitis, not specified as acute or chronic: Secondary | ICD-10-CM

## 2018-01-20 MED ORDER — VALACYCLOVIR HCL 1 G PO TABS: ORAL_TABLET | ORAL | 6 refills | Status: DC

## 2018-01-20 MED ORDER — BENZONATATE 200 MG PO CAPS: 200.0000 mg | ORAL_CAPSULE | Freq: Three times a day (TID) | ORAL | 0 refills | Status: DC | PRN

## 2018-01-20 MED ORDER — GUAIFENESIN ER 600 MG PO TB12: 600.0000 mg | ORAL_TABLET | Freq: Two times a day (BID) | ORAL | 0 refills | Status: DC

## 2018-01-20 MED ORDER — PREDNISONE 10 MG PO TABS: ORAL_TABLET | ORAL | 0 refills | Status: DC

## 2018-01-20 NOTE — Progress Notes (Signed)
BP 134/83 (BP Location: Right Arm, Patient Position: Sitting, Cuff Size: Large)   Pulse 70   Temp 98.4 F (36.9 C) (Oral)   Wt 224 lb 1.6 oz (101.7 kg)   LMP  (LMP Unknown)   SpO2 98%   BMI 38.71 kg/m    Subjective:    Patient ID: Barbara Fitzpatrick, female    DOB: 11/08/1951, 67 y.o.   MRN: [id number]  HPI: Barbara Fitzpatrick is a 67 y.o. female  Chief Complaint  Patient presents with  . Mouth Lesions    Ongoing for 3.5 weeks. Patient states she can't get it to heal, and it keeps draining.   . Sinusitis    X's 4 days  . Cough    Dry cough.   Large cold sore on center of bottom lip x 3.5 weeks. Has gotten these many times in the past, never this bad. Using camphor, vitamin E, and abreva the past few weeks with minimal improvement. Has never had testing confirmation or been on antivirals for this issue. Has never gotten sores anywhere else on body.   Also having 4 days of dry, hacking cough, mild wheezes, rhinorrhea, sinus pain and pressure, and ear pressure. Denies sore throat, fevers, chills, body aches, CP. No hx of pulmonary dz, non-smoker. Has not been trying anything OTC beyond her normal allergy regimen. Lots of sick contacts.   Past Medical History:  Diagnosis Date  . Arthritis   . Depression   . GERD (gastroesophageal reflux disease)   . Hyperlipidemia   . Hypertension   . Personal history of colonic adenomas 06/16/2013   4 adenomas (max 1 cm)  04/2003  no polyps 04/2005   . Sleep apnea    uses c-pap   Social History   Socioeconomic History  . Marital status: Married    Spouse name: Not on file  . Number of children: Not on file  . Years of education: Not on file  . Highest education level: Not on file  Social Needs  . Financial resource strain: Not on file  . Food insecurity - worry: Not on file  . Food insecurity - inability: Not on file  . Transportation needs - medical: Not on file  . Transportation needs - non-medical: Not on file  Occupational History    . Not on file  Tobacco Use  . Smoking status: Never Smoker  . Smokeless tobacco: Never Used  Substance and Sexual Activity  . Alcohol use: No  . Drug use: No  . Sexual activity: No  Other Topics Concern  . Not on file  Social History Narrative  . Not on file   Relevant past medical, surgical, family and social history reviewed and updated as indicated. Interim medical history since our last visit reviewed. Allergies and medications reviewed and updated.  Review of Systems  Constitutional: Negative.   HENT: Positive for ear pain, rhinorrhea, sinus pressure and sinus pain.   Respiratory: Positive for cough, chest tightness and wheezing.   Cardiovascular: Negative.   Gastrointestinal: Negative.   Musculoskeletal: Negative.   Skin:       Cold sore, lip   Neurological: Negative.   Psychiatric/Behavioral: Negative.     Per HPI unless specifically indicated above     Objective:    BP 134/83 (BP Location: Right Arm, Patient Position: Sitting, Cuff Size: Large)   Pulse 70   Temp 98.4 F (36.9 C) (Oral)   Wt 224 lb 1.6 oz (101.7 kg)   LMP  (  LMP Unknown)   SpO2 98%   BMI 38.71 kg/m   Wt Readings from Last 3 Encounters:  01/20/18 224 lb 1.6 oz (101.7 kg)  11/03/17 228 lb 1.6 oz (103.5 kg)  08/14/17 232 lb 8 oz (105.5 kg)    Physical Exam  Constitutional: She is oriented to person, place, and time. She appears well-developed and well-nourished. No distress.  HENT:  Head: Atraumatic.  Mild middle ear effusion b/l Nasal mucosa and oropharynx erythematous, rhinorrhea present  Eyes: Conjunctivae are normal. Pupils are equal, round, and reactive to light.  Neck: Normal range of motion. Neck supple.  Cardiovascular: Normal rate and normal heart sounds.  Pulmonary/Chest: Effort normal. No respiratory distress. She has wheezes (minimal). She has no rales.  Musculoskeletal: Normal range of motion.  Neurological: She is alert and oriented to person, place, and time.  Skin:  Skin is warm and dry.  Large crusted sore on center of bottom lip  Psychiatric: She has a normal mood and affect. Her behavior is normal.  Nursing note and vitals reviewed.  Results for orders placed or performed in visit on 11/03/17  Comprehensive metabolic panel  Result Value Ref Range   Glucose 121 (H) 65 - 99 mg/dL   BUN 12 8 - 27 mg/dL   Creatinine, Ser 0.89 0.57 - 1.00 mg/dL   GFR calc non Af Amer 68 >59 mL/min/1.73   GFR calc Af Amer 79 >59 mL/min/1.73   BUN/Creatinine Ratio 13 12 - 28   Sodium 140 134 - 144 mmol/L   Potassium 3.8 3.5 - 5.2 mmol/L   Chloride 99 96 - 106 mmol/L   CO2 27 20 - 29 mmol/L   Calcium 10.5 (H) 8.7 - 10.3 mg/dL   Total Protein 7.0 6.0 - 8.5 g/dL   Albumin 4.2 3.6 - 4.8 g/dL   Globulin, Total 2.8 1.5 - 4.5 g/dL   Albumin/Globulin Ratio 1.5 1.2 - 2.2   Bilirubin Total 0.6 0.0 - 1.2 mg/dL   Alkaline Phosphatase 89 39 - 117 IU/L   AST 24 0 - 40 IU/L   ALT 36 (H) 0 - 32 IU/L  Lipid Panel w/o Chol/HDL Ratio  Result Value Ref Range   Cholesterol, Total 160 100 - 199 mg/dL   Triglycerides 190 (H) 0 - 149 mg/dL   HDL 36 (L) >39 mg/dL   VLDL Cholesterol Cal 38 5 - 40 mg/dL   LDL Calculated 86 0 - 99 mg/dL  Hgb A1c w/o eAG  Result Value Ref Range   Hgb A1c MFr Bld 5.9 (H) 4.8 - 5.6 %      Assessment & Plan:   Problem List Items Addressed This Visit    None    Visit Diagnoses    Recurrent cold sores    -  Primary   Will draw HSV 1 and 2 labs for confirmation, start valtrex BID during flares for spot tx. Continue abreva, vaseline for topical comfort.    Relevant Medications   valACYclovir (VALTREX) 1000 MG tablet   Other Relevant Orders   HSV(herpes simplex vrs) 1+2 ab-IgG   Bronchitis       No evidence of bacterial component at this time, will tx with prednisone, mucinex, tessalon, delsym, continued allergy regimen, supportive care.        Follow up plan: Return for as scheduled.

## 2018-01-20 NOTE — Patient Instructions (Signed)
Can take delsym in addition to the tessalon perles for extra coverage for that daytime cough Keep taking the zyrtec, can add flonase nasal spray twice daily as well as humidifiers, vapor rubs, essential oils, sinus rinses

## 2018-01-21 LAB — HSV(HERPES SIMPLEX VRS) I + II AB-IGG
HSV 1 GLYCOPROTEIN G AB, IGG: 53.1 {index} — AB (ref 0.00–0.90)
HSV 2 IgG, Type Spec: <0.91 index (ref 0.00–0.90)

## 2018-01-23 ENCOUNTER — Encounter: Payer: Self-pay | Admitting: Family Medicine

## 2018-01-28 ENCOUNTER — Other Ambulatory Visit: Payer: Self-pay | Admitting: Family Medicine

## 2018-01-28 MED ORDER — ACYCLOVIR 5 % EX OINT: 1.0000 "application " | TOPICAL_OINTMENT | Freq: Three times a day (TID) | CUTANEOUS | 1 refills | Status: DC | PRN

## 2018-01-29 ENCOUNTER — Other Ambulatory Visit: Payer: Self-pay | Admitting: Family Medicine

## 2018-01-29 MED ORDER — AZITHROMYCIN 250 MG PO TABS: ORAL_TABLET | ORAL | 0 refills | Status: DC

## 2018-02-23 ENCOUNTER — Other Ambulatory Visit: Payer: Self-pay | Admitting: Unknown Physician Specialty

## 2018-03-21 ENCOUNTER — Other Ambulatory Visit: Payer: Self-pay | Admitting: Unknown Physician Specialty

## 2018-04-20 ENCOUNTER — Other Ambulatory Visit: Payer: Self-pay | Admitting: Unknown Physician Specialty

## 2018-05-05 ENCOUNTER — Encounter: Payer: Self-pay | Admitting: Unknown Physician Specialty

## 2018-05-05 ENCOUNTER — Ambulatory Visit (INDEPENDENT_AMBULATORY_CARE_PROVIDER_SITE_OTHER): Payer: Medicare Other | Admitting: Unknown Physician Specialty

## 2018-05-05 VITALS — BP 130/90 | HR 72 | Ht 67.0 in | Wt 239.0 lb

## 2018-05-05 DIAGNOSIS — I10 Essential (primary) hypertension: Secondary | ICD-10-CM

## 2018-05-05 DIAGNOSIS — R7301 Impaired fasting glucose: Secondary | ICD-10-CM

## 2018-05-05 DIAGNOSIS — E782 Mixed hyperlipidemia: Secondary | ICD-10-CM

## 2018-05-05 LAB — BAYER DCA HB A1C WAIVED: HB A1C (BAYER DCA - WAIVED): 6.2 % (ref <7.0)

## 2018-05-05 NOTE — Assessment & Plan Note (Signed)
Stable, continue present medications.   

## 2018-05-05 NOTE — Assessment & Plan Note (Signed)
Hgb A1C is 6.2%.  Discussed diet and exercise

## 2018-05-05 NOTE — Progress Notes (Signed)
   BP 130/90   Pulse 72   Ht 5\' 7"  (1.702 m)   Wt 239 lb (108.4 kg)   LMP  (LMP Unknown)   SpO2 96%   BMI 37.43 kg/m    Subjective:    Patient ID: Barbara Fitzpatrick, female    DOB: 03-01-1951, 67 y.o.   MRN: [id number]  HPI: Barbara Fitzpatrick is a 67 y.o. female  Chief Complaint  Patient presents with  . Follow-up  . Diabetes   Hypertension Using medications without difficulty Average home BPs Not checking   No problems or lightheadedness No chest pain with exertion or shortness of breath No Edema  Hyperlipidemia Using medications without problems: No Muscle aches  Diet compliance:Exercise:Stays busy.   No official exercise program  IFG 6 months ago Hgb A1C was 5.9    Relevant past medical, surgical, family and social history reviewed and updated as indicated. Interim medical history since our last visit reviewed. Allergies and medications reviewed and updated.  Review of Systems  Constitutional: Negative.   Cardiovascular: Negative.   Gastrointestinal: Negative.   Psychiatric/Behavioral: Negative.     Per HPI unless specifically indicated above     Objective:    BP 130/90   Pulse 72   Ht 5\' 7"  (1.702 m)   Wt 239 lb (108.4 kg)   LMP  (LMP Unknown)   SpO2 96%   BMI 37.43 kg/m   Wt Readings from Last 3 Encounters:  05/05/18 239 lb (108.4 kg)  01/20/18 224 lb 1.6 oz (101.7 kg)  11/03/17 228 lb 1.6 oz (103.5 kg)    Physical Exam  Constitutional: She is oriented to person, place, and time. She appears well-developed and well-nourished. No distress.  HENT:  Head: Normocephalic and atraumatic.  Eyes: Conjunctivae and lids are normal. Right eye exhibits no discharge. Left eye exhibits no discharge. No scleral icterus.  Neck: Normal range of motion. Neck supple. No JVD present. Carotid bruit is not present.  Cardiovascular: Normal rate, regular rhythm and normal heart sounds.  Pulmonary/Chest: Effort normal and breath sounds normal.  Abdominal: Normal  appearance. There is no splenomegaly or hepatomegaly.  Musculoskeletal: Normal range of motion.  Neurological: She is alert and oriented to person, place, and time.  Skin: Skin is warm, dry and intact. No rash noted. No pallor.  Psychiatric: She has a normal mood and affect. Her behavior is normal. Judgment and thought content normal.    Results for orders placed or performed in visit on 01/20/18  HSV(herpes simplex vrs) 1+2 ab-IgG  Result Value Ref Range   HSV 1 Glycoprotein G Ab, IgG 53.10 (H) 0.00 - 0.90 index   HSV 2 IgG, Type Spec <0.91 0.00 - 0.90 index      Assessment & Plan:   Problem List Items Addressed This Visit      Unprioritized   Benign hypertension    Stable, continue present medications.        Relevant Orders   Comprehensive metabolic panel   Hyperlipemia - Primary   Relevant Orders   Lipid Panel w/o Chol/HDL Ratio   Comprehensive metabolic panel   Impaired fasting glucose    Hgb A1C is 6.2%.  Discussed diet and exercise      Relevant Orders   Bayer DCA Hb A1c Waived       Follow up plan: Return for Needs Medicare wellness by end of June.

## 2018-05-06 LAB — COMPREHENSIVE METABOLIC PANEL
A/G RATIO: 1.6 (ref 1.2–2.2)
ALBUMIN: 4.1 g/dL (ref 3.6–4.8)
ALT: 38 IU/L — ABNORMAL HIGH (ref 0–32)
AST: 25 IU/L (ref 0–40)
Alkaline Phosphatase: 83 IU/L (ref 39–117)
BILIRUBIN TOTAL: 0.6 mg/dL (ref 0.0–1.2)
BUN / CREAT RATIO: 16 (ref 12–28)
BUN: 13 mg/dL (ref 8–27)
CO2: 25 mmol/L (ref 20–29)
Calcium: 10.5 mg/dL — ABNORMAL HIGH (ref 8.7–10.3)
Chloride: 103 mmol/L (ref 96–106)
Creatinine, Ser: 0.82 mg/dL (ref 0.57–1.00)
GFR calc non Af Amer: 75 mL/min/{1.73_m2} (ref >59)
GFR, EST AFRICAN AMERICAN: 87 mL/min/{1.73_m2} (ref >59)
GLOBULIN, TOTAL: 2.5 g/dL (ref 1.5–4.5)
Glucose: 128 mg/dL — ABNORMAL HIGH (ref 65–99)
POTASSIUM: 4.1 mmol/L (ref 3.5–5.2)
SODIUM: 142 mmol/L (ref 134–144)
TOTAL PROTEIN: 6.6 g/dL (ref 6.0–8.5)

## 2018-05-06 LAB — LIPID PANEL W/O CHOL/HDL RATIO
Cholesterol, Total: 172 mg/dL (ref 100–199)
HDL: 37 mg/dL — AB (ref >39)
LDL CALC: 95 mg/dL (ref 0–99)
Triglycerides: 200 mg/dL — ABNORMAL HIGH (ref 0–149)
VLDL Cholesterol Cal: 40 mg/dL (ref 5–40)

## 2018-05-06 NOTE — Progress Notes (Signed)
Normal labs.  Pt notified through mychart

## 2018-05-20 ENCOUNTER — Other Ambulatory Visit: Payer: Self-pay | Admitting: Unknown Physician Specialty

## 2018-06-10 ENCOUNTER — Telehealth: Payer: Self-pay | Admitting: Unknown Physician Specialty

## 2018-06-10 NOTE — Telephone Encounter (Signed)
Copied from [place] [id number]. Topic: Quick Communication - Rx Refill/Question >> Jun 10, 2018  2:10 PM Bea Graff, NT wrote: Medication: hydrochlorothiazide (HYDRODIURIL) 25 MG tablet  Has the patient contacted their pharmacy? Yes.   (Agent: If no, request that the patient contact the pharmacy for the refill.) (Agent: If yes, when and what did the pharmacy advise?)  Preferred Pharmacy (with phone number or street name): [facility] [place], [address] [phone] (Phone) [phone] (Fax)      Agent: Please be advised that RX refills may take up to 3 business days. We ask that you follow-up with your pharmacy.

## 2018-06-11 ENCOUNTER — Other Ambulatory Visit: Payer: Self-pay

## 2018-06-11 MED ORDER — HYDROCHLOROTHIAZIDE 25 MG PO TABS: 25.0000 mg | ORAL_TABLET | Freq: Every day | ORAL | 1 refills | Status: DC

## 2018-06-15 ENCOUNTER — Ambulatory Visit (INDEPENDENT_AMBULATORY_CARE_PROVIDER_SITE_OTHER): Payer: Medicare Other

## 2018-06-15 VITALS — BP 142/82 | HR 77 | Temp 97.4°F | Resp 16 | Ht 63.0 in | Wt 243.7 lb

## 2018-06-15 DIAGNOSIS — Z1231 Encounter for screening mammogram for malignant neoplasm of breast: Secondary | ICD-10-CM | POA: Diagnosis not present

## 2018-06-15 DIAGNOSIS — Z780 Asymptomatic menopausal state: Secondary | ICD-10-CM | POA: Diagnosis not present

## 2018-06-15 DIAGNOSIS — Z0000 Encounter for general adult medical examination without abnormal findings: Secondary | ICD-10-CM | POA: Diagnosis not present

## 2018-06-15 DIAGNOSIS — Z1239 Encounter for other screening for malignant neoplasm of breast: Secondary | ICD-10-CM

## 2018-06-15 NOTE — Patient Instructions (Addendum)
Barbara Fitzpatrick , Thank you for taking time to come for your Medicare Wellness Visit. I appreciate your ongoing commitment to your health goals. Please review the following plan we discussed and let me know if I can assist you in the future.   Screening recommendations/referrals: Colonoscopy: completed 07/01/2017 Mammogram: due, Please call [phone] to schedule your mammogram.  Bone Density: due, Please call [phone] to schedule. Recommended yearly ophthalmology/optometry visit for glaucoma screening and checkup Recommended yearly dental visit for hygiene and checkup  Vaccinations: Influenza vaccine: up to date, due 08/2018 Pneumococcal vaccine: due 11/04/2018 Tdap vaccine: up to date Shingles vaccine: shingrix eligible, check with your insurance company for coverage     Advanced directives: Please bring a copy of your health care power of attorney and living will to the office at your convenience.  Conditions/risks identified: Recommend drinking at least 6-8 glasses of water a day   Next appointment: follow up in one year for your annual wellness exam.    Preventive Care 65 Years and Older, Female Preventive care refers to lifestyle choices and visits with your health care provider that can promote health and wellness. What does preventive care include?  A yearly physical exam. This is also called an annual well check.  Dental exams once or twice a year.  Routine eye exams. Ask your health care provider how often you should have your eyes checked.  Personal lifestyle choices, including:  Daily care of your teeth and gums.  Regular physical activity.  Eating a healthy diet.  Avoiding tobacco and drug use.  Limiting alcohol use.  Practicing safe sex.  Taking low-dose aspirin every day.  Taking vitamin and mineral supplements as recommended by your health care provider. What happens during an annual well check? The services and screenings done by your health care  provider during your annual well check will depend on your age, overall health, lifestyle risk factors, and family history of disease. Counseling  Your health care provider may ask you questions about your:  Alcohol use.  Tobacco use.  Drug use.  Emotional well-being.  Home and relationship well-being.  Sexual activity.  Eating habits.  History of falls.  Memory and ability to understand (cognition).  Work and work Statistician.  Reproductive health. Screening  You may have the following tests or measurements:  Height, weight, and BMI.  Blood pressure.  Lipid and cholesterol levels. These may be checked every 5 years, or more frequently if you are over 42 years old.  Skin check.  Lung cancer screening. You may have this screening every year starting at age 11 if you have a 30-pack-year history of smoking and currently smoke or have quit within the past 15 years.  Fecal occult blood test (FOBT) of the stool. You may have this test every year starting at age 87.  Flexible sigmoidoscopy or colonoscopy. You may have a sigmoidoscopy every 5 years or a colonoscopy every 10 years starting at age 36.  Hepatitis C blood test.  Hepatitis B blood test.  Sexually transmitted disease (STD) testing.  Diabetes screening. This is done by checking your blood sugar (glucose) after you have not eaten for a while (fasting). You may have this done every 1-3 years.  Bone density scan. This is done to screen for osteoporosis. You may have this done starting at age 52.  Mammogram. This may be done every 1-2 years. Talk to your health care provider about how often you should have regular mammograms. Talk with your health care provider  about your test results, treatment options, and if necessary, the need for more tests. Vaccines  Your health care provider may recommend certain vaccines, such as:  Influenza vaccine. This is recommended every year.  Tetanus, diphtheria, and acellular  pertussis (Tdap, Td) vaccine. You may need a Td booster every 10 years.  Zoster vaccine. You may need this after age 18.  Pneumococcal 13-valent conjugate (PCV13) vaccine. One dose is recommended after age 64.  Pneumococcal polysaccharide (PPSV23) vaccine. One dose is recommended after age 54. Talk to your health care provider about which screenings and vaccines you need and how often you need them. This information is not intended to replace advice given to you by your health care provider. Make sure you discuss any questions you have with your health care provider. Document Released: 01/05/2016 Document Revised: 08/28/2016 Document Reviewed: 10/10/2015 Elsevier Interactive Patient Education  2017 [place] Prevention in the Home Falls can cause injuries. They can happen to people of all ages. There are many things you can do to make your home safe and to help prevent falls. What can I do on the outside of my home?  Regularly fix the edges of walkways and driveways and fix any cracks.  Remove anything that might make you trip as you walk through a door, such as a raised step or threshold.  Trim any bushes or trees on the path to your home.  Use bright outdoor lighting.  Clear any walking paths of anything that might make someone trip, such as rocks or tools.  Regularly check to see if handrails are loose or broken. Make sure that both sides of any steps have handrails.  Any raised decks and porches should have guardrails on the edges.  Have any leaves, snow, or ice cleared regularly.  Use sand or salt on walking paths during winter.  Clean up any spills in your garage right away. This includes oil or grease spills. What can I do in the bathroom?  Use night lights.  Install grab bars by the toilet and in the tub and shower. Do not use towel bars as grab bars.  Use non-skid mats or decals in the tub or shower.  If you need to sit down in the shower, use a plastic,  non-slip stool.  Keep the floor dry. Clean up any water that spills on the floor as soon as it happens.  Remove soap buildup in the tub or shower regularly.  Attach bath mats securely with double-sided non-slip rug tape.  Do not have throw rugs and other things on the floor that can make you trip. What can I do in the bedroom?  Use night lights.  Make sure that you have a light by your bed that is easy to reach.  Do not use any sheets or blankets that are too big for your bed. They should not hang down onto the floor.  Have a firm chair that has side arms. You can use this for support while you get dressed.  Do not have throw rugs and other things on the floor that can make you trip. What can I do in the kitchen?  Clean up any spills right away.  Avoid walking on wet floors.  Keep items that you use a lot in easy-to-reach places.  If you need to reach something above you, use a strong step stool that has a grab bar.  Keep electrical cords out of the way.  Do not use floor polish or  wax that makes floors slippery. If you must use wax, use non-skid floor wax.  Do not have throw rugs and other things on the floor that can make you trip. What can I do with my stairs?  Do not leave any items on the stairs.  Make sure that there are handrails on both sides of the stairs and use them. Fix handrails that are broken or loose. Make sure that handrails are as long as the stairways.  Check any carpeting to make sure that it is firmly attached to the stairs. Fix any carpet that is loose or worn.  Avoid having throw rugs at the top or bottom of the stairs. If you do have throw rugs, attach them to the floor with carpet tape.  Make sure that you have a light switch at the top of the stairs and the bottom of the stairs. If you do not have them, ask someone to add them for you. What else can I do to help prevent falls?  Wear shoes that:  Do not have high heels.  Have rubber  bottoms.  Are comfortable and fit you well.  Are closed at the toe. Do not wear sandals.  If you use a stepladder:  Make sure that it is fully opened. Do not climb a closed stepladder.  Make sure that both sides of the stepladder are locked into place.  Ask someone to hold it for you, if possible.  Clearly mark and make sure that you can see:  Any grab bars or handrails.  First and last steps.  Where the edge of each step is.  Use tools that help you move around (mobility aids) if they are needed. These include:  Canes.  Walkers.  Scooters.  Crutches.  Turn on the lights when you go into a dark area. Replace any light bulbs as soon as they burn out.  Set up your furniture so you have a clear path. Avoid moving your furniture around.  If any of your floors are uneven, fix them.  If there are any pets around you, be aware of where they are.  Review your medicines with your doctor. Some medicines can make you feel dizzy. This can increase your chance of falling. Ask your doctor what other things that you can do to help prevent falls. This information is not intended to replace advice given to you by your health care provider. Make sure you discuss any questions you have with your health care provider. Document Released: 10/05/2009 Document Revised: 05/16/2016 Document Reviewed: 01/13/2015 Elsevier Interactive Patient Education  2017 [employer].

## 2018-06-15 NOTE — Progress Notes (Signed)
Subjective:   Barbara Fitzpatrick is a 67 y.o. female who presents for an Initial Medicare Annual Wellness Visit.  Review of Systems       Cardiac Risk Factors include: advanced age (>80men, >57 women);diabetes mellitus;dyslipidemia;hypertension;obesity (BMI >30kg/m2)     Objective:    Today's Vitals   06/15/18 0836 06/15/18 0840  BP: (!) 142/82   Pulse: 77   Resp: 16   Temp: (!) 97.4 F (36.3 C)   TempSrc: Temporal   Weight: 243 lb 11.2 oz (110.5 kg)   Height: 5\' 3"  (1.6 m)   PainSc:  0-No pain   Body mass index is 43.17 kg/m.  Advanced Directives 06/15/2018 04/10/2017  Does Patient Have a Medical Advance Directive? Yes Yes  Type of Paramedic of [place];Living will Living will;Healthcare Power of [place] in Chart? No - copy requested No - copy requested    Current Medications (verified) Outpatient Encounter Medications as of 06/15/2018  Medication Sig  . acyclovir ointment (ZOVIRAX) 5 % Apply 1 application topically 3 (three) times daily as needed.  Marland Kitchen aspirin 81 MG tablet Take 81 mg by mouth daily.  Marland Kitchen atorvastatin (LIPITOR) 40 MG tablet Take 1 tablet (40 mg total) by mouth daily.  . cetirizine (ZYRTEC) 10 MG tablet Take 10 mg by mouth daily.  . Cholecalciferol (VITAMIN D-3 PO) Take 3,000 Units by mouth daily.   . Coenzyme Q10 (CO Q-10 PO) Take by mouth daily.  . cyclobenzaprine (FLEXERIL) 10 MG tablet Take 1 tablet (10 mg total) by mouth 3 (three) times daily as needed for muscle spasms.  . DYMISTA 137-50 MCG/ACT SUSP Place 1 spray into the nose 2 (two) times daily.   Marland Kitchen glucosamine-chondroitin 500-400 MG tablet Take 1 tablet by mouth 2 (two) times daily.  . hydrochlorothiazide (HYDRODIURIL) 25 MG tablet Take 1 tablet (25 mg total) by mouth daily.  . Magnesium 250 MG TABS Take by mouth daily.   . Methylcellulose, Laxative, (FIBER THERAPY PO) Take 1 tablet by mouth daily.  . Multiple Vitamins-Minerals  (MULTIVITAMIN PO) Take by mouth daily.  Marland Kitchen omeprazole (PRILOSEC) 20 MG capsule Take 20 mg by mouth daily.  . pramipexole (MIRAPEX) 0.5 MG tablet Take 1 tablet (0.5 mg total) by mouth at bedtime as needed.  . Probiotic Product (PROBIOTIC COLON SUPPORT PO) Take by mouth. Take one pill every other day  . quinapril (ACCUPRIL) 40 MG tablet TAKE 1 TABLET BY MOUTH AT BEDTIME  . vitamin B-12 (CYANOCOBALAMIN) 1000 MCG tablet Take 1,000 mcg by mouth daily.  . vitamin E (VITAMIN E) 1000 UNIT capsule Take 1,000 Units by mouth daily.  Marland Kitchen EPIPEN 2-PAK 0.3 MG/0.3ML SOAJ injection as needed.   . valACYclovir (VALTREX) 1000 MG tablet Take one tablet twice daily at the first sign of a flare for about 3-5 days (Patient not taking: Reported on 06/15/2018)   No facility-administered encounter medications on file as of 06/15/2018.     Allergies (verified) Patient has no known allergies.   History: Past Medical History:  Diagnosis Date  . Arthritis   . Depression   . GERD (gastroesophageal reflux disease)   . Hyperlipidemia   . Hypertension   . Personal history of colonic adenomas 06/16/2013   4 adenomas (max 1 cm)  04/2003  no polyps 04/2005   . Sleep apnea    uses c-pap   Past Surgical History:  Procedure Laterality Date  . COLONOSCOPY    . hysterectomy  with bladder tack  . TUBAL LIGATION     Family History  Problem Relation Age of Onset  . Hypertension Mother   . Hyperlipidemia Mother   . Diabetes Mother   . Cancer Mother        colon (benign)  . Glaucoma Mother   . Heart disease Father   . Hypertension Father   . Hyperlipidemia Father   . Diabetes Father   . Diabetes Sister   . Lung disease Sister        polyps  . Cancer Brother        kidney  . Glaucoma Brother   . Hyperlipidemia Brother   . Hypertension Brother   . Stroke Maternal Grandfather   . Diabetes Paternal Grandmother   . Heart disease Paternal Grandfather   . Cancer Maternal Aunt 50       ovarian  . Colon cancer Neg  Hx   . Esophageal cancer Neg Hx   . Rectal cancer Neg Hx   . Stomach cancer Neg Hx    Social History   Socioeconomic History  . Marital status: Single    Spouse name: Not on file  . Number of children: Not on file  . Years of education: Not on file  . Highest education level: Not on file  Occupational History  . Not on file  Social Needs  . Financial resource strain: Not hard at all  . Food insecurity:    Worry: Never true    Inability: Never true  . Transportation needs:    Medical: No    Non-medical: No  Tobacco Use  . Smoking status: Never Smoker  . Smokeless tobacco: Never Used  Substance and Sexual Activity  . Alcohol use: No  . Drug use: No  . Sexual activity: Never  Lifestyle  . Physical activity:    Days per week: 0 days    Minutes per session: 0 min  . Stress: Not at all  Relationships  . Social connections:    Talks on phone: Once a week    Gets together: More than three times a week    Attends religious service: More than 4 times per year    Active member of club or organization: Yes    Attends meetings of clubs or organizations: More than 4 times per year    Relationship status: Widowed  Other Topics Concern  . Not on file  Social History Narrative  . Not on file    Tobacco Counseling Counseling given: Not Answered   Clinical Intake:  Pre-visit preparation completed: Yes  Pain : No/denies pain Pain Score: 0-No pain     Nutritional Status: BMI > 30  Obese Nutritional Risks: None Diabetes: Yes CBG done?: No Did pt. bring in CBG monitor from home?: No  How often do you need to have someone help you when you read instructions, pamphlets, or other written materials from your doctor or pharmacy?: 1 - Never What is the last grade level you completed in school?: 12th grade  Interpreter Needed?: No  Information entered by :: Barbara Weedman,LPN    Activities of Daily Living In your present state of health, do you have any difficulty  performing the following activities: 06/15/2018 05/05/2018  Hearing? N N  Vision? N N  Difficulty concentrating or making decisions? N N  Walking or climbing stairs? N N  Dressing or bathing? N N  Doing errands, shopping? N N  Preparing Food and eating ? N -  Using  the Toilet? N -  In the past six months, have you accidently leaked urine? Y -  Comment wears pads as precautionary  -  Do you have problems with loss of bowel control? N -  Managing your Medications? N -  Managing your Finances? N -  Housekeeping or managing your Housekeeping? N -  Some recent data might be hidden     Immunizations and Health Maintenance Immunization History  Administered Date(s) Administered  . Influenza, High Dose Seasonal PF 11/03/2017  . Influenza,inj,Quad PF,6+ Mos 09/15/2015, 10/15/2016  . Pneumococcal Conjugate-13 11/03/2017  . Tdap 04/23/2012  . Zoster 03/16/2014   Health Maintenance Due  Topic Date Due  . FOOT EXAM  07/23/1962  . OPHTHALMOLOGY EXAM  06/04/2018    Patient Care Team: Kathrine Haddock, NP as PCP - General (Nurse Practitioner)  Indicate any recent Medical Services you may have received from other than [facility] providers in the past year (date may be approximate).     Assessment:   This is a routine wellness examination for Barbara Fitzpatrick.  Hearing/Vision screen Vision Screening Comments: Goes to [employer] annually   Dietary issues and exercise activities discussed: Current Exercise Habits: The patient does not participate in regular exercise at present, Exercise limited by: None identified  Goals    . DIET - INCREASE WATER INTAKE     Recommend drinking at least 6-8 glasses of water a day       Depression Screen PHQ 2/9 Scores 06/15/2018 05/05/2018 11/03/2017 04/10/2017 09/18/2016  PHQ - 2 Score 5 0 1 1 2   PHQ- 9 Score 15 - 2 - 9    Fall Risk Fall Risk  06/15/2018 05/05/2018 11/03/2017 08/14/2017 05/22/2017  Falls in the past year? No No No No No    Is the patient's home  free of loose throw rugs in walkways, pet beds, electrical cords, etc?   yes      Grab bars in the bathroom? yes      Handrails on the stairs?   no stairs       Adequate lighting?   yes  Timed Get Up and Go Performed Completed in 8 seconds with no use of assistive devices, steady gait. No intervention needed at this time.   Cognitive Function:     6CIT Screen 06/15/2018  What Year? 0 points  What month? 0 points  What time? 0 points  Count back from 20 0 points  Months in reverse 0 points  Repeat phrase 0 points  Total Score 0    Screening Tests Health Maintenance  Topic Date Due  . FOOT EXAM  07/23/1962  . OPHTHALMOLOGY EXAM  06/04/2018  . DEXA SCAN  05/06/2019 (Originally 07/23/2017)  . INFLUENZA VACCINE  07/23/2018  . MAMMOGRAM  10/22/2018  . PNA vac Low Risk Adult (2 of 2 - PPSV23) 11/03/2018  . HEMOGLOBIN A1C  11/05/2018  . TETANUS/TDAP  04/23/2022  . COLONOSCOPY  07/01/2022  . Hepatitis C Screening  Completed  . HIV Screening  Completed    Qualifies for Shingles Vaccine? Yes,discussed shingrix vaccine  Cancer Screenings: Lung: Low Dose CT Chest recommended if Age 44-80 years, 30 pack-year currently smoking OR have quit w/in 15years. Patient does not qualify. Breast: Up to date on Mammogram? No   Up to date of Bone Density/Dexa? No Colorectal: completed 07/01/2017  Additional Screenings:  Hepatitis C Screening: completed 09/15/2015     Plan:    I have personally reviewed and addressed the Medicare Annual Wellness questionnaire and  have noted the following in the patient's chart:  A. Medical and social history B. Use of alcohol, tobacco or illicit drugs  C. Current medications and supplements D. Functional ability and status E.  Nutritional status F.  Physical activity G. Advance directives H. List of other physicians I.  Hospitalizations, surgeries, and ER visits in previous 12 months J.  [place] such as hearing and vision if needed, cognitive  and depression L. Referrals and appointments   In addition, I have reviewed and discussed with patient certain preventive protocols, quality metrics, and best practice recommendations. A written personalized care plan for preventive services as well as general preventive health recommendations were provided to patient.   Signed,  Barbara Aas, LPN Nurse Health Advisor   Nurse Notes:  Patient scored 15 on phq-9 today, patient denies any SI/HI at this time. She denies having a plan set but states she has thought about the different possible ways of doing it. Patient states it is something she has dealt with in the past and was on medication for it, she feels this medication did help when she was on it. Denies seeing a counselor or psychiatrist previously but has spoke with Barbara Fitzpatrick about this in the past as well. Patient was given information for mobile crisis management line and discussed with Dr.Johnson. patient advised to make a follow up with Barbara Fitzpatrick as soon as possible and to call interm if needed. She denied needing to see any provider today. Appt made with Barbara Fitzpatrick on 06/17/18.

## 2018-06-17 ENCOUNTER — Ambulatory Visit (INDEPENDENT_AMBULATORY_CARE_PROVIDER_SITE_OTHER): Payer: Medicare Other | Admitting: Unknown Physician Specialty

## 2018-06-17 ENCOUNTER — Encounter: Payer: Self-pay | Admitting: Unknown Physician Specialty

## 2018-06-17 DIAGNOSIS — F322 Major depressive disorder, single episode, severe without psychotic features: Secondary | ICD-10-CM | POA: Diagnosis not present

## 2018-06-17 MED ORDER — BUPROPION HCL ER (XL) 150 MG PO TB24: 150.0000 mg | ORAL_TABLET | Freq: Every day | ORAL | 0 refills | Status: DC

## 2018-06-17 NOTE — Patient Instructions (Signed)
[facility] - Phone: [phone]

## 2018-06-17 NOTE — Progress Notes (Signed)
BP 123/83   Pulse 89   Temp 98.7 F (37.1 C) (Oral)   Ht 5\' 3"  (1.6 m)   Wt 244 lb 12.8 oz (111 kg)   LMP  (LMP Unknown)   SpO2 97%   BMI 43.36 kg/m    Subjective:    Patient ID: Barbara Fitzpatrick, female    DOB: Jan 16, 1951, 67 y.o.   MRN: [id number]  HPI: Barbara Fitzpatrick is a 67 y.o. female  Chief Complaint  Patient presents with  . Follow-up   Depression Pt having a difficult time.  Recently lost her mother and wonders if this is why she is having a difficult time.  States some days are fine and some days she is not sure she can get up.  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  She has been on Wellbutrin in the past.  This did well for her and able to wean off.  No suicidal plans.  Depression screen [facility] 2/9 06/15/2018 05/05/2018 11/03/2017 04/10/2017 09/18/2016  Decreased Interest 3 0 0 0 1  Down, Depressed, Hopeless 2 0 1 1 1   PHQ - 2 Score 5 0 1 1 2   Altered sleeping 2 - 0 - 1  Tired, decreased energy 3 - 0 - 2  Change in appetite 3 - 0 - 2  Feeling bad or failure about yourself  1 - 1 - 1  Trouble concentrating 0 - 0 - 0  Moving slowly or fidgety/restless 0 - 0 - 0  Suicidal thoughts 1 - 0 - 1  PHQ-9 Score 15 - 2 - 9  Difficult doing work/chores Somewhat difficult - - - -    Relevant past medical, surgical, family and social history reviewed and updated as indicated. Interim medical history since our last visit reviewed. Allergies and medications reviewed and updated.  Review of Systems  Per HPI unless specifically indicated above     Objective:    BP 123/83   Pulse 89   Temp 98.7 F (37.1 C) (Oral)   Ht 5\' 3"  (1.6 m)   Wt 244 lb 12.8 oz (111 kg)   LMP  (LMP Unknown)   SpO2 97%   BMI 43.36 kg/m   Wt Readings from Last 3 Encounters:  06/17/18 244 lb 12.8 oz (111 kg)  06/15/18 243 lb 11.2 oz (110.5 kg)  05/05/18 239 lb (108.4 kg)    Physical Exam  Constitutional: She is oriented to person, place, and time. She appears  well-developed and well-nourished. No distress.  HENT:  Head: Normocephalic and atraumatic.  Eyes: Conjunctivae and lids are normal. Right eye exhibits no discharge. Left eye exhibits no discharge. No scleral icterus.  Cardiovascular: Normal rate.  Pulmonary/Chest: Effort normal.  Abdominal: Normal appearance. There is no splenomegaly or hepatomegaly.  Musculoskeletal: Normal range of motion.  Neurological: She is alert and oriented to person, place, and time.  Skin: Skin is intact. No rash noted. No pallor.  Psychiatric: She has a normal mood and affect. Her behavior is normal. Judgment and thought content normal.    Results for orders placed or performed in visit on 05/05/18  Lipid Panel w/o Chol/HDL Ratio  Result Value Ref Range   Cholesterol, Total 172 100 - 199 mg/dL   Triglycerides 200 (H) 0 - 149 mg/dL   HDL 37 (L) >39 mg/dL   VLDL Cholesterol Cal 40 5 - 40 mg/dL   LDL Calculated 95 0 - 99 mg/dL  Comprehensive metabolic panel  Result Value Ref Range   Glucose 128 (H) 65 - 99 mg/dL   BUN 13 8 - 27 mg/dL   Creatinine, Ser 0.82 0.57 - 1.00 mg/dL   GFR calc non Af Amer 75 >59 mL/min/1.73   GFR calc Af Amer 87 >59 mL/min/1.73   BUN/Creatinine Ratio 16 12 - 28   Sodium 142 134 - 144 mmol/L   Potassium 4.1 3.5 - 5.2 mmol/L   Chloride 103 96 - 106 mmol/L   CO2 25 20 - 29 mmol/L   Calcium 10.5 (H) 8.7 - 10.3 mg/dL   Total Protein 6.6 6.0 - 8.5 g/dL   Albumin 4.1 3.6 - 4.8 g/dL   Globulin, Total 2.5 1.5 - 4.5 g/dL   Albumin/Globulin Ratio 1.6 1.2 - 2.2   Bilirubin Total 0.6 0.0 - 1.2 mg/dL   Alkaline Phosphatase 83 39 - 117 IU/L   AST 25 0 - 40 IU/L   ALT 38 (H) 0 - 32 IU/L  Bayer DCA Hb A1c Waived  Result Value Ref Range   HB A1C (BAYER DCA - WAIVED) 6.2 <7.0 %      Assessment & Plan:   Problem List Items Addressed This Visit      Unprioritized   Depression, major, single episode, 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  Start Wellbutrin 150 mg XR daily.  Recheck in 2-3 weeks.   Number given for grievance counseling.        Relevant Medications   buPROPion (WELLBUTRIN XL) 150 MG 24 hr tablet       Follow up plan: Return in about 3 weeks (around 07/08/2018).

## 2018-06-17 NOTE — Assessment & Plan Note (Signed)
New problem.  Start Wellbutrin 150 mg XR daily.  Recheck in 2-3 weeks.  Number given for grievance counseling.

## 2018-06-18 ENCOUNTER — Other Ambulatory Visit: Payer: Self-pay | Admitting: Family Medicine

## 2018-06-18 DIAGNOSIS — N6310 Unspecified lump in the right breast, unspecified quadrant: Secondary | ICD-10-CM

## 2018-06-19 ENCOUNTER — Other Ambulatory Visit: Payer: Self-pay | Admitting: Unknown Physician Specialty

## 2018-07-03 ENCOUNTER — Ambulatory Visit
Admission: RE | Admit: 2018-07-03 | Discharge: 2018-07-03 | Disposition: A | Discharge status: Home / Self Care | Payer: Medicare Other | Source: Ambulatory Visit | Attending: Family Medicine | Admitting: Family Medicine

## 2018-07-03 DIAGNOSIS — N6310 Unspecified lump in the right breast, unspecified quadrant: Secondary | ICD-10-CM | POA: Insufficient documentation

## 2018-07-14 ENCOUNTER — Encounter: Payer: Self-pay | Admitting: Unknown Physician Specialty

## 2018-07-14 ENCOUNTER — Ambulatory Visit: Payer: Medicare Other | Admitting: Unknown Physician Specialty

## 2018-07-14 VITALS — BP 131/84 | HR 73 | Temp 98.3°F | Ht 63.0 in | Wt 236.6 lb

## 2018-07-14 DIAGNOSIS — F322 Major depressive disorder, single episode, severe without psychotic features: Secondary | ICD-10-CM | POA: Diagnosis not present

## 2018-07-14 DIAGNOSIS — R632 Polyphagia: Secondary | ICD-10-CM

## 2018-07-14 MED ORDER — BUPROPION HCL ER (XL) 150 MG PO TB24: 150.0000 mg | ORAL_TABLET | Freq: Every day | ORAL | 0 refills | Status: DC

## 2018-07-14 NOTE — Assessment & Plan Note (Signed)
Pt had significant improvement.  Will continue Wellbutrin.  Recheck in November for physical

## 2018-07-14 NOTE — Progress Notes (Signed)
BP 131/84   Pulse 73   Temp 98.3 F (36.8 C) (Oral)   Ht 5\' 3"  (1.6 m)   Wt 236 lb 9.6 oz (107.3 kg)   LMP  (LMP Unknown)   SpO2 95%   BMI 41.91 kg/m    Subjective:    Patient ID: Barbara Barbara, female    DOB: 06/03/1951, 67 y.o.   MRN: [id number]  HPI: Barbara Barbara is a 67 y.o. female  Chief Complaint  Patient presents with  . Depression    Depression Started on Wellbutrin last visit.  Pt feels she is doing much better and some of the family friction is beginning to resolve.  Talking to friends.  Noted appetite is down  Depression screen [facility] 2/9 07/14/2018 06/15/2018 05/05/2018 11/03/2017 04/10/2017  Decreased Interest 1 3 0 0 0  Down, Depressed, Hopeless 1 2 0 1 1  PHQ - 2 Score 2 5 0 1 1  Altered sleeping 1 2 - 0 -  Tired, decreased energy 1 3 - 0 -  Change in appetite 1 3 - 0 -  Feeling bad or failure about yourself  1 1 - 1 -  Trouble concentrating 0 0 - 0 -  Moving slowly or fidgety/restless 1 0 - 0 -  Suicidal thoughts 0 1 - 0 -  PHQ-9 Score 7 15 - 2 -  Difficult doing work/chores - Somewhat difficult - - -    Relevant past medical, surgical, family and social history reviewed and updated as indicated. Interim medical history since our last visit reviewed. Allergies and medications reviewed and updated.  Review of Systems  Per HPI unless specifically indicated above     Objective:    BP 131/84   Pulse 73   Temp 98.3 F (36.8 C) (Oral)   Ht 5\' 3"  (1.6 m)   Wt 236 lb 9.6 oz (107.3 kg)   LMP  (LMP Unknown)   SpO2 95%   BMI 41.91 kg/m   Wt Readings from Last 3 Encounters:  07/14/18 236 lb 9.6 oz (107.3 kg)  06/17/18 244 lb 12.8 oz (111 kg)  06/15/18 243 lb 11.2 oz (110.5 kg)    Physical Exam  Constitutional: She is oriented to person, place, and time. She appears well-developed and well-nourished. No distress.  HENT:  Head: Normocephalic and atraumatic.  Eyes: Conjunctivae and lids are normal. Right eye exhibits no discharge. Left eye  exhibits no discharge. No scleral icterus.  Neck: Normal range of motion. Neck supple. No JVD present. Carotid bruit is not present.  Cardiovascular: Normal rate, regular rhythm and normal heart sounds.  Pulmonary/Chest: Effort normal and breath sounds normal.  Abdominal: Normal appearance. There is no splenomegaly or hepatomegaly.  Musculoskeletal: Normal range of motion.  Neurological: She is alert and oriented to person, place, and time.  Skin: Skin is warm, dry and intact. No rash noted. No pallor.  Psychiatric: She has a normal mood and affect. Her behavior is normal. Judgment and thought content normal.    Results for orders placed or performed in visit on 05/05/18  Lipid Panel w/o Chol/HDL Ratio  Result Value Ref Range   Cholesterol, Total 172 100 - 199 mg/dL   Triglycerides 200 (H) 0 - 149 mg/dL   HDL 37 (L) >39 mg/dL   VLDL Cholesterol Cal 40 5 - 40 mg/dL   LDL Calculated 95 0 - 99 mg/dL  Comprehensive metabolic panel  Result Value Ref Range   Glucose 128 (H) 65 - 99  mg/dL   BUN 13 8 - 27 mg/dL   Creatinine, Ser 0.82 0.57 - 1.00 mg/dL   GFR calc non Af Amer 75 >59 mL/min/1.73   GFR calc Af Amer 87 >59 mL/min/1.73   BUN/Creatinine Ratio 16 12 - 28   Sodium 142 134 - 144 mmol/L   Potassium 4.1 3.5 - 5.2 mmol/L   Chloride 103 96 - 106 mmol/L   CO2 25 20 - 29 mmol/L   Calcium 10.5 (H) 8.7 - 10.3 mg/dL   Total Protein 6.6 6.0 - 8.5 g/dL   Albumin 4.1 3.6 - 4.8 g/dL   Globulin, Total 2.5 1.5 - 4.5 g/dL   Albumin/Globulin Ratio 1.6 1.2 - 2.2   Bilirubin Total 0.6 0.0 - 1.2 mg/dL   Alkaline Phosphatase 83 39 - 117 IU/L   AST 25 0 - 40 IU/L   ALT 38 (H) 0 - 32 IU/L  Bayer DCA Hb A1c Waived  Result Value Ref Range   HB A1C (BAYER DCA - WAIVED) 6.2 <7.0 %      Assessment & Plan:   Problem List Items Addressed This Visit      Unprioritized   Depression, major, single episode, severe ([place])    Pt had significant improvement.  Will continue Wellbutrin.  Recheck in  November for physical      Relevant Medications   buPROPion (WELLBUTRIN XL) 150 MG 24 hr tablet    Other Visit Diagnoses    Overeating    -  Primary   Improved with Wellbutrin       Follow up plan: Return if symptoms worsen or fail to improve, for Physical in november.  Marland Kitchen

## 2018-07-22 ENCOUNTER — Ambulatory Visit
Admission: RE | Admit: 2018-07-22 | Discharge: 2018-07-22 | Disposition: A | Discharge status: Home / Self Care | Payer: Medicare Other | Source: Ambulatory Visit | Attending: Family Medicine | Admitting: Family Medicine

## 2018-07-22 DIAGNOSIS — Z780 Asymptomatic menopausal state: Secondary | ICD-10-CM | POA: Insufficient documentation

## 2018-07-23 ENCOUNTER — Encounter: Payer: Self-pay | Admitting: Family Medicine

## 2018-07-23 DIAGNOSIS — M8580 Other specified disorders of bone density and structure, unspecified site: Secondary | ICD-10-CM | POA: Insufficient documentation

## 2018-07-23 DIAGNOSIS — M8588 Other specified disorders of bone density and structure, other site: Secondary | ICD-10-CM | POA: Insufficient documentation

## 2018-08-01 LAB — HM DIABETES EYE EXAM

## 2018-09-10 ENCOUNTER — Other Ambulatory Visit: Payer: Self-pay | Admitting: Unknown Physician Specialty

## 2018-09-10 NOTE — Telephone Encounter (Signed)
Bupropion XL 150 mg  refill Last Refill:07/14/18 # 90 Last OV: 07/14/18 PCP: C. [place]: [facility] # [phone]  Too soon for refill.

## 2018-09-14 ENCOUNTER — Encounter: Payer: Self-pay | Admitting: Family Medicine

## 2018-09-14 ENCOUNTER — Ambulatory Visit: Payer: Medicare Other | Admitting: Family Medicine

## 2018-09-14 VITALS — BP 114/76 | HR 68 | Temp 97.7°F | Ht 63.0 in | Wt 235.9 lb

## 2018-09-14 DIAGNOSIS — F322 Major depressive disorder, single episode, severe without psychotic features: Secondary | ICD-10-CM

## 2018-09-14 DIAGNOSIS — Z23 Encounter for immunization: Secondary | ICD-10-CM

## 2018-09-14 DIAGNOSIS — B372 Candidiasis of skin and nail: Secondary | ICD-10-CM | POA: Diagnosis not present

## 2018-09-14 DIAGNOSIS — R61 Generalized hyperhidrosis: Secondary | ICD-10-CM

## 2018-09-14 MED ORDER — ALUMINUM CHLORIDE 20 % EX SOLN: Freq: Every day | CUTANEOUS | 1 refills | Status: DC

## 2018-09-14 MED ORDER — NYSTATIN 100000 UNIT/GM EX CREA: 1.0000 "application " | TOPICAL_CREAM | Freq: Two times a day (BID) | CUTANEOUS | 1 refills | Status: DC

## 2018-09-14 MED ORDER — BUPROPION HCL ER (XL) 300 MG PO TB24: 300.0000 mg | ORAL_TABLET | Freq: Every day | ORAL | 2 refills | Status: DC

## 2018-09-14 MED ORDER — NYSTATIN 100000 UNIT/GM EX POWD: Freq: Four times a day (QID) | CUTANEOUS | 1 refills | Status: DC

## 2018-09-14 NOTE — Patient Instructions (Signed)

## 2018-09-14 NOTE — Progress Notes (Signed)
BP 114/76 (BP Location: Left Arm, Patient Position: Sitting, Cuff Size: Normal)   Pulse 68   Temp 97.7 F (36.5 C) (Oral)   Ht 5\' 3"  (1.6 m)   Wt 235 lb 14.4 oz (107 kg)   LMP  (LMP Unknown)   SpO2 97%   BMI 41.79 kg/m    Subjective:    Patient ID: Barbara Fitzpatrick, female    DOB: 07-29-1951, 67 y.o.   MRN: [id number]  HPI: Barbara Fitzpatrick is a 67 y.o. female  Chief Complaint  Patient presents with  . Depression    Medication Follow-up, Wellbutrin  . Medication Management   Here today for depression follow up after starting wellbutrin. Doing well on the medication, no side effects and dose notice a difference. Denies SI/HI, sleep or appetite issues.   Notes increased sweating with any amount of exertion lately. Back of neck, under breasts, in groin area. Starting to get rashes in several areas. Has tried powders without improvement.   Past Medical History:  Diagnosis Date  . Arthritis   . Depression   . GERD (gastroesophageal reflux disease)   . Hyperlipidemia   . Hypertension   . Personal history of colonic adenomas 06/16/2013   4 adenomas (max 1 cm)  04/2003  no polyps 04/2005   . Sleep apnea    uses c-pap   Social History   Socioeconomic History  . Marital status: Single    Spouse name: Not on file  . Number of children: Not on file  . Years of education: Not on file  . Highest education level: Not on file  Occupational History  . Not on file  Social Needs  . Financial resource strain: Not hard at all  . Food insecurity:    Worry: Never true    Inability: Never true  . Transportation needs:    Medical: No    Non-medical: No  Tobacco Use  . Smoking status: Never Smoker  . Smokeless tobacco: Never Used  Substance and Sexual Activity  . Alcohol use: No  . Drug use: No  . Sexual activity: Never  Lifestyle  . Physical activity:    Days per week: 0 days    Minutes per session: 0 min  . Stress: Not at all  Relationships  . Social connections:   Talks on phone: Once a week    Gets together: More than three times a week    Attends religious service: More than 4 times per year    Active member of club or organization: Yes    Attends meetings of clubs or organizations: More than 4 times per year    Relationship status: Widowed  . Intimate partner violence:    Fear of current or ex partner: No    Emotionally abused: No    Physically abused: No    Forced sexual activity: No  Other Topics Concern  . Not on file  Social History Narrative  . Not on file    Relevant past medical, surgical, family and social history reviewed and updated as indicated. Interim medical history since our last visit reviewed. Allergies and medications reviewed and updated.  Review of Systems  Per HPI unless specifically indicated above     Objective:    BP 114/76 (BP Location: Left Arm, Patient Position: Sitting, Cuff Size: Normal)   Pulse 68   Temp 97.7 F (36.5 C) (Oral)   Ht 5\' 3"  (1.6 m)   Wt 235 lb 14.4 oz (107 kg)  LMP  (LMP Unknown)   SpO2 97%   BMI 41.79 kg/m   Wt Readings from Last 3 Encounters:  09/14/18 235 lb 14.4 oz (107 kg)  07/14/18 236 lb 9.6 oz (107.3 kg)  06/17/18 244 lb 12.8 oz (111 kg)    Physical Exam  Constitutional: She is oriented to person, place, and time. She appears well-developed and well-nourished. No distress.  HENT:  Head: Atraumatic.  Eyes: Conjunctivae and EOM are normal.  Neck: Normal range of motion. Neck supple.  Cardiovascular: Normal rate and regular rhythm.  Pulmonary/Chest: Effort normal and breath sounds normal.  Musculoskeletal: Normal range of motion.  Neurological: She is alert and oriented to person, place, and time.  Skin: Skin is warm and dry. Rash (Intertriginous rash right groin fold and under b/l breasts) noted.  Psychiatric: She has a normal mood and affect. Her behavior is normal.  Nursing note and vitals reviewed.   Results for orders placed or performed in visit on 08/11/18    HM DIABETES EYE EXAM  Result Value Ref Range   HM Diabetic Eye Exam No Retinopathy No Retinopathy      Assessment & Plan:   Problem List Items Addressed This Visit      Other   Depression, major, single episode, severe ([place]) - Primary    Improved on wellbutrin, increase to 300 mg XL and monitor closely.       Relevant Medications   buPROPion (WELLBUTRIN XL) 300 MG 24 hr tablet    Other Visit Diagnoses    Need for influenza vaccination       Relevant Orders   Flu vaccine HIGH DOSE PF (Fluzone High dose) (Completed)   Cutaneous candidiasis       Nystatin cream and powder sent, will also work on controlling persipration, washing areas bid with antibacterial soap   Relevant Medications   nystatin cream (MYCOSTATIN)   nystatin (NYSTATIN) powder   Sweating increase       Trial of drysol, unscented spray deodorant recommended for certain areas such as under breasts       Follow up plan: Return in about 2 months (around 11/14/2018) for CPE with Jolene.

## 2018-09-17 ENCOUNTER — Other Ambulatory Visit: Payer: Self-pay | Admitting: Unknown Physician Specialty

## 2018-09-17 NOTE — Assessment & Plan Note (Signed)
Improved on wellbutrin, increase to 300 mg XL and monitor closely.

## 2018-10-18 ENCOUNTER — Other Ambulatory Visit: Payer: Self-pay | Admitting: Unknown Physician Specialty

## 2018-11-16 ENCOUNTER — Other Ambulatory Visit: Payer: Self-pay | Admitting: Unknown Physician Specialty

## 2018-11-17 ENCOUNTER — Encounter: Payer: Self-pay | Admitting: Nurse Practitioner

## 2018-11-17 ENCOUNTER — Other Ambulatory Visit: Payer: Self-pay | Admitting: *Deleted

## 2018-11-17 ENCOUNTER — Other Ambulatory Visit: Payer: Self-pay

## 2018-11-17 ENCOUNTER — Ambulatory Visit (INDEPENDENT_AMBULATORY_CARE_PROVIDER_SITE_OTHER): Payer: Medicare Other | Admitting: Nurse Practitioner

## 2018-11-17 VITALS — BP 136/82 | HR 73 | Temp 97.9°F | Ht 63.5 in | Wt 236.0 lb

## 2018-11-17 DIAGNOSIS — Z6841 Body Mass Index (BMI) 40.0 and over, adult: Secondary | ICD-10-CM

## 2018-11-17 DIAGNOSIS — E785 Hyperlipidemia, unspecified: Secondary | ICD-10-CM | POA: Diagnosis not present

## 2018-11-17 DIAGNOSIS — Z0000 Encounter for general adult medical examination without abnormal findings: Secondary | ICD-10-CM | POA: Diagnosis not present

## 2018-11-17 DIAGNOSIS — M7582 Other shoulder lesions, left shoulder: Secondary | ICD-10-CM

## 2018-11-17 DIAGNOSIS — I10 Essential (primary) hypertension: Secondary | ICD-10-CM | POA: Diagnosis not present

## 2018-11-17 DIAGNOSIS — R7301 Impaired fasting glucose: Secondary | ICD-10-CM

## 2018-11-17 DIAGNOSIS — K219 Gastro-esophageal reflux disease without esophagitis: Secondary | ICD-10-CM

## 2018-11-17 DIAGNOSIS — F322 Major depressive disorder, single episode, severe without psychotic features: Secondary | ICD-10-CM

## 2018-11-17 MED ORDER — HYDROCHLOROTHIAZIDE 25 MG PO TABS: 25.0000 mg | ORAL_TABLET | Freq: Every day | ORAL | 1 refills | Status: DC

## 2018-11-17 MED ORDER — CYCLOBENZAPRINE HCL 10 MG PO TABS: 10.0000 mg | ORAL_TABLET | Freq: Three times a day (TID) | ORAL | 0 refills | Status: DC | PRN

## 2018-11-17 NOTE — Assessment & Plan Note (Signed)
Previous A1C 6.2% prediabetic range.  Repeat A1C today.  Continue focus on diet and exercise.

## 2018-11-17 NOTE — Assessment & Plan Note (Addendum)
Acute, present since August with minimal improvement.  PT order placed for PIVOT and #30 tablets Flexeril with 0 refill ordered, to use minimally.  Recommended use of Tylenol as needed (650MG  Q6H), heat/ice, and Icy/Hot.

## 2018-11-17 NOTE — Assessment & Plan Note (Signed)
Continue to discuss and focus on diet and exercise regimen.

## 2018-11-17 NOTE — Assessment & Plan Note (Signed)
Chronic, stable.  Continue current regimen.  Lipid panel and CMP today.

## 2018-11-17 NOTE — Progress Notes (Addendum)
BP 136/82   Pulse 73   Temp 97.9 F (36.6 C) (Oral)   Ht 5' 3.5" (1.613 m)   Wt 236 lb (107 kg)   LMP  (LMP Unknown)   SpO2 94%   BMI 41.15 kg/m    Subjective:    Patient ID: Barbara Fitzpatrick, female    DOB: 1951/06/21, 67 y.o.   MRN: [id number]  HPI: Barbara Fitzpatrick is a 67 y.o. female presents for annual physical  Chief Complaint  Patient presents with  . Annual Exam    pt would like to discuss left shouder pain   LEFT SHOULDER PAIN: Has been present since mid-August.  Door handle was broken, so she put seat back to climb over the console out of car.  Put a lot of strain and weight on left shoulder to get across.  Went to chiropractor for adjustments, but has not helped resolve issue.  Notices pain more while picking items up, putting coat on, reaching behind her, and has to lift arms up slowly to prevent pain.  When has pain she reports it 9/10 at worse and 0/10 when sitting and not moving.  Takes Tylenol at night, which helps at night.  Notices discomfort when rolling at night.  Has not used any lotions or creams.  Has used ice, did not help much.  Has been followed in past by [facility] for similar issue in right arm.  HYPERTENSION/HLD Hypertension status: controlled  Satisfied with current treatment? yes Duration of hypertension: chronic BP monitoring frequency:  rarely BP range: 130/80's BP medication side effects:  no Medication compliance: good compliance Previous BP meds: none Aspirin: yes Recurrent headaches: no Visual changes: no Palpitations: no Dyspnea: no Chest pain: no Lower extremity edema: no Dizzy/lightheaded: no   IFG: Has had elevated glucose readings on past CMP, BMP testing.  Often in mid to high 120 range on review.  Discussed diabetes.  Will perform A1C blood test today.  Denies polyphagia, polydipsia, polyuria.  GERD: Chronic, stable on current medication regimen (Omeprazole).  Denies N&V, heart burn, or dysphagia.  DEPRESSION Reports good  control with Wellbutrin, wishes to maintain current dose although has noted weight gain with it. Mood status: controlled Satisfied with current treatment?: is satisfied with medicaiton, but does report increase dose has caused weight gain Symptom severity: mild  Duration of current treatment : chronic Side effects: no Medication compliance: excellent compliance Psychotherapy/counseling: no never gone Previous psychiatric medications:none Depressed mood: no Anxious mood: no Anhedonia: no Significant weight loss or gain: yes Insomnia: no issues Fatigue: no Feelings of worthlessness or guilt: no Impaired concentration/indecisiveness: no Suicidal ideations: no Hopelessness: no Crying spells: no Depression screen [facility] 2/9 11/17/2018 09/14/2018 07/14/2018 06/15/2018 05/05/2018  Decreased Interest 1 1 1 3  0  Down, Depressed, Hopeless 1 1 1 2  0  PHQ - 2 Score 2 2 2 5  0  Altered sleeping 2 2 1 2  -  Tired, decreased energy 1 2 1 3  -  Change in appetite 3 3 1 3  -  Feeling bad or failure about yourself  0 1 1 1  -  Trouble concentrating 0 2 0 0 -  Moving slowly or fidgety/restless 1 1 1  0 -  Suicidal thoughts 0 0 0 1 -  PHQ-9 Score 9 13 7 15  -  Difficult doing work/chores Not difficult at all - - Somewhat difficult -   . GAD 7 : Generalized Anxiety Score 11/17/2018  Nervous, Anxious, on Edge 1  Control/stop worrying 0  Worry too much - different things 0  Trouble relaxing 1  Restless 0  Easily annoyed or irritable 0  Afraid - awful might happen 0  Total GAD 7 Score 2    Functional Status Survey: Is the patient deaf or have difficulty hearing?: No Does the patient have difficulty seeing, even when wearing glasses/contacts?: No Does the patient have difficulty concentrating, remembering, or making decisions?: No Does the patient have difficulty walking or climbing stairs?: No Does the patient have difficulty dressing or bathing?: No Does the patient have difficulty doing errands alone  such as visiting a doctor's office or shopping?: No  Social History   Socioeconomic History  . Marital status: Single    Spouse name: Not on file  . Number of children: Not on file  . Years of education: Not on file  . Highest education level: Not on file  Occupational History  . Not on file  Social Needs  . Financial resource strain: Not hard at all  . Food insecurity:    Worry: Never true    Inability: Never true  . Transportation needs:    Medical: No    Non-medical: No  Tobacco Use  . Smoking status: Never Smoker  . Smokeless tobacco: Never Used  Substance and Sexual Activity  . Alcohol use: No  . Drug use: No  . Sexual activity: Never  Lifestyle  . Physical activity:    Days per week: 0 days    Minutes per session: 0 min  . Stress: Not at all  Relationships  . Social connections:    Talks on phone: Once a week    Gets together: More than three times a week    Attends religious service: More than 4 times per year    Active member of club or organization: Yes    Attends meetings of clubs or organizations: More than 4 times per year    Relationship status: Widowed  . Intimate partner violence:    Fear of current or ex partner: No    Emotionally abused: No    Physically abused: No    Forced sexual activity: No  Other Topics Concern  . Not on file  Social History Narrative  . Not on file   Relevant past medical, surgical, family and social history reviewed and updated as indicated. Interim medical history since our last visit reviewed. Allergies and medications reviewed and updated.  Review of Systems  Constitutional: Negative for activity change, appetite change, diaphoresis, fatigue and fever.  Respiratory: Negative for cough, chest tightness, shortness of breath and wheezing.   Cardiovascular: Negative for chest pain, palpitations and leg swelling.  Gastrointestinal: Negative for abdominal distention, abdominal pain, constipation, diarrhea, nausea and  vomiting.  Endocrine: Negative for cold intolerance, heat intolerance, polydipsia, polyphagia and polyuria.  Genitourinary: Negative.   Skin: Negative.   Allergic/Immunologic: Negative.   Neurological: Negative for dizziness, numbness and headaches.  Hematological: Negative.   Psychiatric/Behavioral: Negative for behavioral problems, confusion, sleep disturbance and suicidal ideas. The patient is not nervous/anxious.     Per HPI unless specifically indicated above     Objective:    BP 136/82   Pulse 73   Temp 97.9 F (36.6 C) (Oral)   Ht 5' 3.5" (1.613 m)   Wt 236 lb (107 kg)   LMP  (LMP Unknown)   SpO2 94%   BMI 41.15 kg/m   Wt Readings from Last 3 Encounters:  11/17/18 236 lb (107 kg)  09/14/18 235 lb 14.4  oz (107 kg)  07/14/18 236 lb 9.6 oz (107.3 kg)    Physical Exam  Constitutional: She is oriented to person, place, and time. She appears well-developed and well-nourished.  HENT:  Head: Normocephalic and atraumatic.  Right Ear: Hearing, tympanic membrane, external ear and ear canal normal.  Left Ear: Hearing, tympanic membrane, external ear and ear canal normal.  Nose: Nose normal. Right sinus exhibits no maxillary sinus tenderness and no frontal sinus tenderness. Left sinus exhibits no maxillary sinus tenderness and no frontal sinus tenderness.  Mouth/Throat: Oropharynx is clear and moist.  Eyes: Pupils are equal, round, and reactive to light. Conjunctivae and EOM are normal. Right eye exhibits no discharge. Left eye exhibits no discharge.  Neck: Normal range of motion. Neck supple. No JVD present. Carotid bruit is not present. No thyromegaly present.  Cardiovascular: Normal rate, regular rhythm, normal heart sounds and intact distal pulses.  Pulmonary/Chest: Effort normal and breath sounds normal. Right breast exhibits no inverted nipple, no mass, no nipple discharge, no skin change and no tenderness. Left breast exhibits no inverted nipple, no mass, no nipple  discharge, no skin change and no tenderness.  Abdominal: Soft. Bowel sounds are normal. There is no splenomegaly or hepatomegaly.  Musculoskeletal:       Right shoulder: She exhibits normal range of motion, no tenderness, no swelling, no crepitus and normal strength.       Left shoulder: She exhibits decreased range of motion, tenderness and decreased strength. She exhibits no swelling and no crepitus.  Positive empty can on left shoulder assessment.  Lymphadenopathy:    She has no cervical adenopathy.  Neurological: She is alert and oriented to person, place, and time. She has normal reflexes.  Reflex Scores:      Brachioradialis reflexes are 2+ on the right side and 2+ on the left side.      Patellar reflexes are 2+ on the right side and 2+ on the left side. Skin: Skin is warm, dry and intact.  Psychiatric: She has a normal mood and affect. Her behavior is normal.   Diabetic Foot Exam - Simple   Simple Foot Form Visual Inspection See comments:  Yes Sensation Testing See comments:  Yes Pulse Check Posterior Tibialis and Dorsalis pulse intact bilaterally:  Yes Comments Poor sensation bilateral feet 2/10 spots felt on both feet.  Xerosis present bilateral feet.  Trace edema.     Results for orders placed or performed in visit on 08/11/18  HM DIABETES EYE EXAM  Result Value Ref Range   HM Diabetic Eye Exam No Retinopathy No Retinopathy      Assessment & Plan:   Problem List Items Addressed This Visit      Cardiovascular and Mediastinum   Benign hypertension    Chronic, stable.  Continue current medication regimen and monitor. Adjust doses as needed.        Digestive   GERD (gastroesophageal reflux disease)    Chronic, stable.  Continue current PPI dose and consider GDR in upcoming months.  Mag level today.      Relevant Orders   CBC with Differential/Platelet   Magnesium     Endocrine   Impaired fasting glucose    Previous A1C 6.2% prediabetic range.  Repeat A1C  today.  Continue focus on diet and exercise.      Relevant Orders   HgB A1c     Musculoskeletal and Integument   Rotator cuff tendinitis, left    Acute, present since August with minimal improvement.  PT order placed for PIVOT and #30 tablets Flexeril with 0 refill ordered, to use minimally.  Recommended use of Tylenol as needed (650MG  Q6H), heat/ice, and Icy/Hot.      Relevant Orders   Ambulatory referral to Physical Therapy     Other   Hyperlipemia    Chronic, stable.  Continue current regimen.  Lipid panel and CMP today.      Relevant Orders   Comprehensive metabolic panel   Lipid Profile   Obesity    Continue to discuss and focus on diet and exercise regimen.      Depression, major, single episode, severe ([facility])    Chronic, stable.  Improved PHQ score today.  Continue current Wellbutrin dose.      Relevant Orders   TSH    Other Visit Diagnoses    Annual physical exam    -  Primary       Follow up plan: Return in about 6 months (around 05/18/2019).

## 2018-11-17 NOTE — Assessment & Plan Note (Signed)
Chronic, stable.  Continue current PPI dose and consider GDR in upcoming months.  Mag level today.

## 2018-11-17 NOTE — Patient Instructions (Signed)
Rotator Cuff Tendinitis Rotator cuff tendinitis is inflammation of the tough, cord-like bands that connect muscle to bone (tendons) in the rotator cuff. The rotator cuff includes all of the muscles and tendons that connect the arm to the shoulder. The rotator cuff holds the head of the upper arm bone (humerus) in the cup (fossa) of the shoulder blade (scapula). This condition can lead to a long-lasting (chronic) tear. The tear may be partial or complete. What are the causes? This condition is usually caused by overusing the rotator cuff. What increases the risk? This condition is more likely to develop in athletes and workers who frequently use their shoulder or reach over their heads. This can include activities such as:  Tennis.  Baseball or softball.  Swimming.  Construction work.  Painting.  What are the signs or symptoms? Symptoms of this condition include:  Pain spreading (radiating) from the shoulder to the upper arm.  Swelling and tenderness in front of the shoulder.  Pain when reaching, pulling, or lifting the arm above the head.  Pain when lowering the arm from above the head.  Minor pain in the shoulder when resting.  Increased pain in the shoulder at night.  Difficulty placing the arm behind the back.  How is this diagnosed? This condition is diagnosed with a medical history and physical exam. Tests may also be done, including:  X-rays.  MRI.  Ultrasounds.  CT or MR arthrogram. During this test, a contrast material is injected and then images are taken.  How is this treated? Treatment for this condition depends on the severity of the condition. In less severe cases, treatment may include:  Rest. This may be done with a sling that holds the shoulder still (immobilization). Your health care provider may also recommend avoiding activities that involve lifting your arm over your head.  Icing the shoulder.  Anti-inflammatory medicines, such as aspirin or  ibuprofen.  In more severe cases, treatment may include:  Physical therapy.  Steroid injections.  Surgery.  Follow these instructions at home: If you have a sling:  Wear the sling as told by your health care provider. Remove it only as told by your health care provider.  Loosen the sling if your fingers tingle, become numb, or turn cold and blue.  Keep the sling clean.  If the sling is not waterproof, do not let it get wet. Remove it, if allowed, or cover it with a watertight covering when you take a bath or shower. Managing pain, stiffness, and swelling  If directed, put ice on the injured area. ? If you have a removable sling, remove it as told by your health care provider. ? Put ice in a plastic bag. ? Place a towel between your skin and the bag. ? Leave the ice on for 20 minutes, 2-3 times a day.  Move your fingers often to avoid stiffness and to lessen swelling.  Raise (elevate) the injured area above the level of your heart while you are lying down.  Find a comfortable sleeping position or sleep on a recliner, if available. Driving  Do not drive or use heavy machinery while taking prescription pain medicine.  Ask your health care provider when it is safe to drive if you have a sling on your arm. Activity  Rest your shoulder as told by your health care provider.  Return to your normal activities as told by your health care provider. Ask your health care provider what activities are safe for you.  Do any   exercises or stretches as told by your health care provider.  If you do repetitive overhead tasks, take small breaks in between and include stretching exercises as told by your health care provider. General instructions  Do not use any products that contain nicotine or tobacco, such as cigarettes and e-cigarettes. These can delay healing. If you need help quitting, ask your health care provider.  Take over-the-counter and prescription medicines only as told by  your health care provider.  Keep all follow-up visits as told by your health care provider. This is important. Contact a health care provider if:  Your pain gets worse.  You have new pain in your arm, hands, or fingers.  Your pain is not relieved with medicine or does not get better after 6 weeks of treatment.  You have cracking sensations when moving your shoulder in certain directions.  You hear a snapping sound after using your shoulder, followed by severe pain and weakness. Get help right away if:  Your arm, hand, or fingers are numb or tingling.  Your arm, hand, or fingers are swollen or painful or they turn white or blue. Summary  Rotator cuff tendinitis is inflammation of the tough, cord-like bands that connect muscle to bone (tendons) in the rotator cuff.  This condition is usually caused by overusing the rotator cuff, which includes all of the muscles and tendons that connect the arm to the shoulder.  This condition is more likely to develop in athletes and workers who frequently use their shoulder or reach over their heads.  Treatment generally includes rest, anti-inflammatory medicines, and icing. In some cases, physical therapy and steroid injections may be needed. In severe cases, surgery may be needed. This information is not intended to replace advice given to you by your health care provider. Make sure you discuss any questions you have with your health care provider. Document Released: 02/29/2004 Document Revised: 11/25/2016 Document Reviewed: 11/25/2016 Elsevier Interactive Patient Education  2017 Elsevier Inc.  

## 2018-11-17 NOTE — Assessment & Plan Note (Signed)
Chronic, stable.  Improved PHQ score today.  Continue current Wellbutrin dose.

## 2018-11-17 NOTE — Assessment & Plan Note (Signed)
Chronic, stable.  Continue current medication regimen and monitor. Adjust doses as needed.

## 2018-11-18 LAB — COMPREHENSIVE METABOLIC PANEL
A/G RATIO: 1.7 (ref 1.2–2.2)
ALBUMIN: 4.1 g/dL (ref 3.6–4.8)
ALT: 35 IU/L — AB (ref 0–32)
AST: 23 IU/L (ref 0–40)
Alkaline Phosphatase: 99 IU/L (ref 39–117)
BUN/Creatinine Ratio: 15 (ref 12–28)
BUN: 12 mg/dL (ref 8–27)
Bilirubin Total: 0.5 mg/dL (ref 0.0–1.2)
CALCIUM: 10.4 mg/dL — AB (ref 8.7–10.3)
CO2: 26 mmol/L (ref 20–29)
Chloride: 99 mmol/L (ref 96–106)
Creatinine, Ser: 0.82 mg/dL (ref 0.57–1.00)
GFR, EST AFRICAN AMERICAN: 86 mL/min/{1.73_m2} (ref >59)
GFR, EST NON AFRICAN AMERICAN: 75 mL/min/{1.73_m2} (ref >59)
GLOBULIN, TOTAL: 2.4 g/dL (ref 1.5–4.5)
Glucose: 137 mg/dL — ABNORMAL HIGH (ref 65–99)
Potassium: 4 mmol/L (ref 3.5–5.2)
SODIUM: 139 mmol/L (ref 134–144)
TOTAL PROTEIN: 6.5 g/dL (ref 6.0–8.5)

## 2018-11-18 LAB — CBC WITH DIFFERENTIAL/PLATELET
BASOS: 0 %
Basophils Absolute: 0 10*3/uL (ref 0.0–0.2)
EOS (ABSOLUTE): 0.1 10*3/uL (ref 0.0–0.4)
Eos: 1 %
HEMOGLOBIN: 14.5 g/dL (ref 11.1–15.9)
Hematocrit: 41.8 % (ref 34.0–46.6)
IMMATURE GRANS (ABS): 0 10*3/uL (ref 0.0–0.1)
IMMATURE GRANULOCYTES: 1 %
Lymphocytes Absolute: 2.3 10*3/uL (ref 0.7–3.1)
Lymphs: 31 %
MCH: 31.1 pg (ref 26.6–33.0)
MCHC: 34.7 g/dL (ref 31.5–35.7)
MCV: 90 fL (ref 79–97)
Monocytes Absolute: 0.5 10*3/uL (ref 0.1–0.9)
Monocytes: 6 %
NEUTROS PCT: 61 %
Neutrophils Absolute: 4.5 10*3/uL (ref 1.4–7.0)
Platelets: 300 10*3/uL (ref 150–450)
RBC: 4.66 x10E6/uL (ref 3.77–5.28)
RDW: 13.1 % (ref 12.3–15.4)
WBC: 7.4 10*3/uL (ref 3.4–10.8)

## 2018-11-18 LAB — LIPID PANEL
CHOL/HDL RATIO: 4.4 ratio (ref 0.0–4.4)
Cholesterol, Total: 167 mg/dL (ref 100–199)
HDL: 38 mg/dL — AB (ref >39)
LDL Calculated: 77 mg/dL (ref 0–99)
TRIGLYCERIDES: 261 mg/dL — AB (ref 0–149)
VLDL Cholesterol Cal: 52 mg/dL — ABNORMAL HIGH (ref 5–40)

## 2018-11-18 LAB — MAGNESIUM: Magnesium: 2.1 mg/dL (ref 1.6–2.3)

## 2018-11-18 LAB — HEMOGLOBIN A1C
Est. average glucose Bld gHb Est-mCnc: 131 mg/dL
HEMOGLOBIN A1C: 6.2 % — AB (ref 4.8–5.6)

## 2018-11-18 LAB — TSH: TSH: 3.05 u[IU]/mL (ref 0.450–4.500)

## 2018-12-06 ENCOUNTER — Other Ambulatory Visit: Payer: Self-pay | Admitting: Family Medicine

## 2018-12-16 ENCOUNTER — Other Ambulatory Visit: Payer: Self-pay | Admitting: Unknown Physician Specialty

## 2018-12-17 NOTE — Telephone Encounter (Signed)
Requested Prescriptions  Pending Prescriptions Disp Refills  . atorvastatin (LIPITOR) 40 MG tablet [Pharmacy Med Name: ATORVASTATIN 40MG  TABLETS] 90 tablet 1    Sig: TAKE 1 TABLET BY MOUTH EVERY DAY     Cardiovascular:  Antilipid - Statins Failed - 12/16/2018  3:04 PM      Failed - HDL in normal range and within 360 days    HDL  Date Value Ref Range Status  11/17/2018 38 (L) >39 mg/dL Final         Failed - Triglycerides in normal range and within 360 days    Triglycerides  Date Value Ref Range Status  11/17/2018 261 (H) 0 - 149 mg/dL Final   Triglycerides Piccolo,Waived  Date Value Ref Range Status  03/13/2016 286 (H) <150 mg/dL Final    Comment:                            Normal                   <150                         Borderline High     150 - 199                         High                200 - 499                         Very High                >499          Passed - Total Cholesterol in normal range and within 360 days    Cholesterol, Total  Date Value Ref Range Status  11/17/2018 167 100 - 199 mg/dL Final   Cholesterol Piccolo, Waived  Date Value Ref Range Status  03/13/2016 191 <200 mg/dL Final    Comment:                            Desirable                <200                         Borderline High      200- 239                         High                     >239          Passed - LDL in normal range and within 360 days    LDL Calculated  Date Value Ref Range Status  11/17/2018 77 0 - 99 mg/dL Final         Passed - Patient is not pregnant      Passed - Valid encounter within last 12 months    Recent Outpatient Visits          1 month ago Annual physical exam   [facility] [place], Jolene T, NP   3 months ago Depression, major, single episode, severe ([place])   [place], Apolonio Schneiders  Benjamine Mola, PA-C   5 months ago [place], [place], NP   6 months ago Depression, major, single  episode, severe ([place])   [place], NP   7 months ago Mixed hyperlipidemia   [facility] Kathrine Haddock, NP      Future Appointments            In 5 months Cannady, Barbaraann Faster, NP [facility]

## 2019-04-26 ENCOUNTER — Other Ambulatory Visit: Payer: Self-pay | Admitting: Unknown Physician Specialty

## 2019-05-15 ENCOUNTER — Other Ambulatory Visit: Payer: Self-pay | Admitting: Nurse Practitioner

## 2019-05-15 NOTE — Telephone Encounter (Signed)
Requested Prescriptions  Pending Prescriptions Disp Refills  . hydrochlorothiazide (HYDRODIURIL) 25 MG tablet [Pharmacy Med Name: HYDROCHLOROTHIAZIDE 25MG  TABLETS] 90 tablet 0    Sig: TAKE 1 TABLET BY MOUTH EVERY DAY     Cardiovascular: Diuretics - Thiazide Failed - 05/15/2019  7:06 AM      Failed - Ca in normal range and within 360 days    Calcium  Date Value Ref Range Status  11/17/2018 10.4 (H) 8.7 - 10.3 mg/dL Final         Passed - Cr in normal range and within 360 days    Creatinine, Ser  Date Value Ref Range Status  11/17/2018 0.82 0.57 - 1.00 mg/dL Final         Passed - K in normal range and within 360 days    Potassium  Date Value Ref Range Status  11/17/2018 4.0 3.5 - 5.2 mmol/L Final         Passed - Na in normal range and within 360 days    Sodium  Date Value Ref Range Status  11/17/2018 139 134 - 144 mmol/L Final         Passed - Last BP in normal range    BP Readings from Last 1 Encounters:  11/17/18 136/82         Passed - Valid encounter within last 6 months    Recent Outpatient Visits          5 months ago Annual physical exam   [facility] [place], Henrine Screws T, NP   8 months ago Depression, major, single episode, severe ([place])   [place] [place], [place]   10 months ago [place], NP   11 months ago Depression, major, single episode, severe ([place])   [place] Kathrine Haddock, NP   1 year ago Mixed hyperlipidemia   [facility] Kathrine Haddock, NP      Future Appointments            In 3 days Cannady, Barbaraann Faster, NP [facility]   In 2 months  [facility]

## 2019-05-18 ENCOUNTER — Ambulatory Visit (INDEPENDENT_AMBULATORY_CARE_PROVIDER_SITE_OTHER): Payer: Medicare Other | Admitting: Nurse Practitioner

## 2019-05-18 ENCOUNTER — Encounter: Payer: Self-pay | Admitting: Nurse Practitioner

## 2019-05-18 ENCOUNTER — Other Ambulatory Visit: Payer: Self-pay

## 2019-05-18 VITALS — BP 129/84 | HR 78 | Temp 98.6°F | Wt 233.8 lb

## 2019-05-18 DIAGNOSIS — E785 Hyperlipidemia, unspecified: Secondary | ICD-10-CM | POA: Diagnosis not present

## 2019-05-18 DIAGNOSIS — I10 Essential (primary) hypertension: Secondary | ICD-10-CM | POA: Diagnosis not present

## 2019-05-18 DIAGNOSIS — Z6841 Body Mass Index (BMI) 40.0 and over, adult: Secondary | ICD-10-CM

## 2019-05-18 DIAGNOSIS — F322 Major depressive disorder, single episode, severe without psychotic features: Secondary | ICD-10-CM

## 2019-05-18 DIAGNOSIS — R7303 Prediabetes: Secondary | ICD-10-CM

## 2019-05-18 NOTE — Patient Instructions (Signed)
Carbohydrate Counting for Diabetes Mellitus, Adult  Carbohydrate counting is a method of keeping track of how many carbohydrates you eat. Eating carbohydrates naturally increases the amount of sugar (glucose) in the blood. Counting how many carbohydrates you eat helps keep your blood glucose within normal limits, which helps you manage your diabetes (diabetes mellitus). It is important to know how many carbohydrates you can safely have in each meal. This is different for every person. A diet and nutrition specialist (registered dietitian) can help you make a meal plan and calculate how many carbohydrates you should have at each meal and snack. Carbohydrates are found in the following foods:  Grains, such as breads and cereals.  Dried beans and soy products.  Starchy vegetables, such as potatoes, peas, and corn.  Fruit and fruit juices.  Milk and yogurt.  Sweets and snack foods, such as cake, cookies, candy, chips, and soft drinks. How do I count carbohydrates? There are two ways to count carbohydrates in food. You can use either of the methods or a combination of both. Reading "Nutrition Facts" on packaged food The "Nutrition Facts" list is included on the labels of almost all packaged foods and beverages in the [place] It includes:  The serving size.  Information about nutrients in each serving, including the grams (g) of carbohydrate per serving. To use the "Nutrition Facts":  Decide how many servings you will have.  Multiply the number of servings by the number of carbohydrates per serving.  The resulting number is the total amount of carbohydrates that you will be having. Learning standard serving sizes of other foods When you eat carbohydrate foods that are not packaged or do not include "Nutrition Facts" on the label, you need to measure the servings in order to count the amount of carbohydrates:  Measure the foods that you will eat with a food scale or measuring cup, if needed.   Decide how many standard-size servings you will eat.  Multiply the number of servings by 15. Most carbohydrate-rich foods have about 15 g of carbohydrates per serving. ? For example, if you eat 8 oz (170 g) of strawberries, you will have eaten 2 servings and 30 g of carbohydrates (2 servings x 15 g = 30 g).  For foods that have more than one food mixed, such as soups and casseroles, you must count the carbohydrates in each food that is included. The following list contains standard serving sizes of common carbohydrate-rich foods. Each of these servings has about 15 g of carbohydrates:   hamburger bun or  English muffin.   oz (15 mL) syrup.   oz (14 g) jelly.  1 slice of bread.  1 six-inch tortilla.  3 oz (85 g) cooked rice or pasta.  4 oz (113 g) cooked dried beans.  4 oz (113 g) starchy vegetable, such as peas, corn, or potatoes.  4 oz (113 g) hot cereal.  4 oz (113 g) mashed potatoes or  of a large baked potato.  4 oz (113 g) canned or frozen fruit.  4 oz (120 mL) fruit juice.  4-6 crackers.  6 chicken nuggets.  6 oz (170 g) unsweetened dry cereal.  6 oz (170 g) plain fat-free yogurt or yogurt sweetened with artificial sweeteners.  8 oz (240 mL) milk.  8 oz (170 g) fresh fruit or one small piece of fruit.  24 oz (680 g) popped popcorn. Example of carbohydrate counting Sample meal  3 oz (85 g) chicken breast.  6 oz (170 g)   brown rice.  4 oz (113 g) corn.  8 oz (240 mL) milk.  8 oz (170 g) strawberries with sugar-free whipped topping. Carbohydrate calculation 1. Identify the foods that contain carbohydrates: ? Rice. ? Corn. ? Milk. ? Strawberries. 2. Calculate how many servings you have of each food: ? 2 servings rice. ? 1 serving corn. ? 1 serving milk. ? 1 serving strawberries. 3. Multiply each number of servings by 15 g: ? 2 servings rice x 15 g = 30 g. ? 1 serving corn x 15 g = 15 g. ? 1 serving milk x 15 g = 15 g. ? 1 serving  strawberries x 15 g = 15 g. 4. Add together all of the amounts to find the total grams of carbohydrates eaten: ? 30 g + 15 g + 15 g + 15 g = 75 g of carbohydrates total. Summary  Carbohydrate counting is a method of keeping track of how many carbohydrates you eat.  Eating carbohydrates naturally increases the amount of sugar (glucose) in the blood.  Counting how many carbohydrates you eat helps keep your blood glucose within normal limits, which helps you manage your diabetes.  A diet and nutrition specialist (registered dietitian) can help you make a meal plan and calculate how many carbohydrates you should have at each meal and snack. This information is not intended to replace advice given to you by your health care provider. Make sure you discuss any questions you have with your health care provider. Document Released: 12/09/2005 Document Revised: 06/18/2017 Document Reviewed: 05/22/2016 Elsevier Interactive Patient Education  2019 Elsevier Inc.  

## 2019-05-18 NOTE — Assessment & Plan Note (Signed)
Recommend continued focus on health diet choices and regular physical activity (30 minutes 5 days a week). 

## 2019-05-18 NOTE — Assessment & Plan Note (Signed)
Repeat labs in November, patient refuses labs at this time.  Educated on diet and exercise changes + possible need for Metformin if continued elevations.

## 2019-05-18 NOTE — Assessment & Plan Note (Signed)
Chronic, stable.  Continue current medication regimen, Wellbutrin 300 MG daily.  PHQ9 today = 5.  Denies SI/HI.

## 2019-05-18 NOTE — Assessment & Plan Note (Signed)
Chronic, stable.  Continue current medication regimen and adjust as needed.  Labs in November.

## 2019-05-18 NOTE — Progress Notes (Signed)
BP 129/84 Comment: pt reported  Pulse 78 Comment: pt reported  Temp 98.6 F (37 C) (Oral) Comment: pt reported  Wt 233 lb 12.8 oz (106.1 kg) Comment: pr reported  LMP  (LMP Unknown)   BMI 40.77 kg/m    Subjective:    Patient ID: Barbara Fitzpatrick, female    DOB: 1951-04-01, 68 y.o.   MRN: [id number]  HPI: Barbara Fitzpatrick is a 68 y.o. female  Chief Complaint  Patient presents with  . Depression  . Hyperlipidemia  . Hypertension    . This visit was completed via telephone due to the restrictions of the COVID-19 pandemic. All issues as above were discussed and addressed but no physical exam was performed. If it was felt that the patient should be evaluated in the office, they were directed there. The patient verbally consented to this visit. Patient was unable to complete an audio/visual visit due to Lack of equipment. Due to the catastrophic nature of the COVID-19 pandemic, this visit was done through audio contact only. . Location of the patient: home . Location of the provider: home . Those involved with this call:  . Provider: Marnee Guarneri, DNP . CMA: Yvonna Alanis, CMA . Front Desk/Registration: Jill Side  . Time spent on call: 15 minutes on the phone discussing health concerns. 10 minutes total spent in review of patient's record and preparation of their chart. I verified patient identity using two factors (patient name and date of birth). Patient consents verbally to being seen via telemedicine visit today.   HYPERTENSION / HYPERLIPIDEMIA Current medications include HCTZ 25 MG QDAY, Accupril 40 MG QDAY, and Lipitor 40 MG QHS. Satisfied with current treatment? yes Duration of hypertension: chronic BP monitoring frequency: rarely BP range: 120-130/80's BP medication side effects: no Past BP meds: Accupril Duration of hyperlipidemia: chronic Cholesterol medication side effects: no Cholesterol supplements: fish oil Past cholesterol medications: Lipitor  Medication compliance: good compliance Aspirin: yes Recent stressors: no Recurrent headaches: no Visual changes: no Palpitations: no Dyspnea: no Chest pain: no Lower extremity edema: "a little,b ut not like it has been" Dizzy/lightheaded: no   DEPRESSION Current medications include Wellbutrin 300 MG QDAY. Mood status: stable Satisfied with current treatment?: yes Symptom severity: mild  Duration of current treatment : chronic Side effects: no Medication compliance: good compliance Psychotherapy/counseling: none Previous psychiatric medications: Wellbutrin Depressed mood: no Anxious mood: no Anhedonia: no Significant weight loss or gain: no Insomnia: none Fatigue: no Feelings of worthlessness or guilt: no Impaired concentration/indecisiveness: no Suicidal ideations: no Hopelessness: no Crying spells: no Depression screen [facility] 2/9 05/18/2019 11/17/2018 09/14/2018 07/14/2018 06/15/2018  Decreased Interest 1 1 1 1 3   Down, Depressed, Hopeless 1 1 1 1 2   PHQ - 2 Score 2 2 2 2 5   Altered sleeping 0 2 2 1 2   Tired, decreased energy 1 1 2 1 3   Change in appetite 1 3 3 1 3   Feeling bad or failure about yourself  0 0 1 1 1   Trouble concentrating 1 0 2 0 0  Moving slowly or fidgety/restless 0 1 1 1  0  Suicidal thoughts 0 0 0 0 1  PHQ-9 Score 5 9 13 7 15   Difficult doing work/chores Not difficult at all Not difficult at all - - Somewhat difficult   GAD 7 : Generalized Anxiety Score 11/17/2018  Nervous, Anxious, on Edge 1  Control/stop worrying 0  Worry too much - different things 0  Trouble relaxing 1  Restless 0  Easily  annoyed or irritable 0  Afraid - awful might happen 0  Total GAD 7 Score 2   PREDIABETES: Labs in November noted A1C 6.2, prior to this had been 5.9%.  No current medications.  Patient would prefer not to have blood work done at this time, but agrees to recheck at November physical.  Educated her on diabetic diet and exercise regimen.  Discussed possible need  for medication if continued elevation, educated on Metformin.  She endorses poor diet and exercise regimen over past few months due to Covid pandemic. Hypoglycemic episodes:no Polydipsia/polyuria: no Visual disturbance: no Chest pain: no Paresthesias: no Pneumovax: Not up to date Influenza: Up to Date Aspirin: yes  Relevant past medical, surgical, family and social history reviewed and updated as indicated. Interim medical history since our last visit reviewed. Allergies and medications reviewed and updated.  Review of Systems  Constitutional: Negative for activity change, appetite change, diaphoresis, fatigue and fever.  Respiratory: Negative for cough, chest tightness and shortness of breath.   Cardiovascular: Negative for chest pain, palpitations and leg swelling.  Gastrointestinal: Negative for abdominal distention, abdominal pain, constipation, diarrhea, nausea and vomiting.  Endocrine: Negative for cold intolerance, heat intolerance, polydipsia, polyphagia and polyuria.  Neurological: Negative for dizziness, syncope, weakness, light-headedness, numbness and headaches.  Psychiatric/Behavioral: Negative.     Per HPI unless specifically indicated above     Objective:    BP 129/84 Comment: pt reported  Pulse 78 Comment: pt reported  Temp 98.6 F (37 C) (Oral) Comment: pt reported  Wt 233 lb 12.8 oz (106.1 kg) Comment: pr reported  LMP  (LMP Unknown)   BMI 40.77 kg/m   Wt Readings from Last 3 Encounters:  05/18/19 233 lb 12.8 oz (106.1 kg)  11/17/18 236 lb (107 kg)  09/14/18 235 lb 14.4 oz (107 kg)    Unable to perform physical exam due to patient lack of equipment for visual.  Results for orders placed or performed in visit on 11/17/18  CBC with Differential/Platelet  Result Value Ref Range   WBC 7.4 3.4 - 10.8 x10E3/uL   RBC 4.66 3.77 - 5.28 x10E6/uL   Hemoglobin 14.5 11.1 - 15.9 g/dL   Hematocrit 41.8 34.0 - 46.6 %   MCV 90 79 - 97 fL   MCH 31.1 26.6 - 33.0 pg    MCHC 34.7 31.5 - 35.7 g/dL   RDW 13.1 12.3 - 15.4 %   Platelets 300 150 - 450 x10E3/uL   Neutrophils 61 Not Estab. %   Lymphs 31 Not Estab. %   Monocytes 6 Not Estab. %   Eos 1 Not Estab. %   Basos 0 Not Estab. %   Neutrophils Absolute 4.5 1.4 - 7.0 x10E3/uL   Lymphocytes Absolute 2.3 0.7 - 3.1 x10E3/uL   Monocytes Absolute 0.5 0.1 - 0.9 x10E3/uL   EOS (ABSOLUTE) 0.1 0.0 - 0.4 x10E3/uL   Basophils Absolute 0.0 0.0 - 0.2 x10E3/uL   Immature Granulocytes 1 Not Estab. %   Immature Grans (Abs) 0.0 0.0 - 0.1 x10E3/uL  Comprehensive metabolic panel  Result Value Ref Range   Glucose 137 (H) 65 - 99 mg/dL   BUN 12 8 - 27 mg/dL   Creatinine, Ser 0.82 0.57 - 1.00 mg/dL   GFR calc non Af Amer 75 >59 mL/min/1.73   GFR calc Af Amer 86 >59 mL/min/1.73   BUN/Creatinine Ratio 15 12 - 28   Sodium 139 134 - 144 mmol/L   Potassium 4.0 3.5 - 5.2 mmol/L   Chloride  99 96 - 106 mmol/L   CO2 26 20 - 29 mmol/L   Calcium 10.4 (H) 8.7 - 10.3 mg/dL   Total Protein 6.5 6.0 - 8.5 g/dL   Albumin 4.1 3.6 - 4.8 g/dL   Globulin, Total 2.4 1.5 - 4.5 g/dL   Albumin/Globulin Ratio 1.7 1.2 - 2.2   Bilirubin Total 0.5 0.0 - 1.2 mg/dL   Alkaline Phosphatase 99 39 - 117 IU/L   AST 23 0 - 40 IU/L   ALT 35 (H) 0 - 32 IU/L  TSH  Result Value Ref Range   TSH 3.050 0.450 - 4.500 uIU/mL  Magnesium  Result Value Ref Range   Magnesium 2.1 1.6 - 2.3 mg/dL  HgB A1c  Result Value Ref Range   Hgb A1c MFr Bld 6.2 (H) 4.8 - 5.6 %   Est. average glucose Bld gHb Est-mCnc 131 mg/dL  Lipid Profile  Result Value Ref Range   Cholesterol, Total 167 100 - 199 mg/dL   Triglycerides 261 (H) 0 - 149 mg/dL   HDL 38 (L) >39 mg/dL   VLDL Cholesterol Cal 52 (H) 5 - 40 mg/dL   LDL Calculated 77 0 - 99 mg/dL   Chol/HDL Ratio 4.4 0.0 - 4.4 ratio      Assessment & Plan:   Problem List Items Addressed This Visit      Cardiovascular and Mediastinum   Benign hypertension    Chronic, stable.  Continue current medication regimen  and adjust as needed.  Labs in November at physical.        Other   Prediabetes    Repeat labs in November, patient refuses labs at this time.  Educated on diet and exercise changes + possible need for Metformin if continued elevations.      Hyperlipemia    Chronic, stable.  Continue current medication regimen and adjust as needed.  Labs in November.      Obesity    Recommend continued focus on health diet choices and regular physical activity (30 minutes 5 days a week).      Depression, major, single episode, severe ([place]) - Primary    Chronic, stable.  Continue current medication regimen, Wellbutrin 300 MG daily.  PHQ9 today = 5.  Denies SI/HI.         I discussed the assessment and treatment plan with the patient. The patient was provided an opportunity to ask questions and all were answered. The patient agreed with the plan and demonstrated an understanding of the instructions.   The patient was advised to call back or seek an in-person evaluation if the symptoms worsen or if the condition fails to improve as anticipated.   I provided 15 minutes of time during this encounter.  Follow up plan: Return in about 6 months (around 11/18/2019) for Annual Physical with labs.

## 2019-05-18 NOTE — Addendum Note (Signed)
Addended by: Marnee Guarneri T on: 05/18/2019 01:51 PM   Modules accepted: Level of Service

## 2019-05-18 NOTE — Assessment & Plan Note (Signed)
Chronic, stable.  Continue current medication regimen and adjust as needed.  Labs in November at physical.

## 2019-06-03 ENCOUNTER — Other Ambulatory Visit: Payer: Self-pay | Admitting: Family Medicine

## 2019-06-03 NOTE — Telephone Encounter (Signed)
Please advise 

## 2019-06-09 NOTE — Telephone Encounter (Signed)
Patient calling to discuss Bupropion status. Wants to know if she if this will be called in. More than 3 business days since requested.

## 2019-06-10 NOTE — Telephone Encounter (Signed)
Called pt to advised her that medication has been sent to [facility]. Pt understood.

## 2019-06-11 ENCOUNTER — Other Ambulatory Visit: Payer: Self-pay | Admitting: Nurse Practitioner

## 2019-06-17 ENCOUNTER — Other Ambulatory Visit: Payer: Self-pay | Admitting: Nurse Practitioner

## 2019-08-02 ENCOUNTER — Ambulatory Visit (INDEPENDENT_AMBULATORY_CARE_PROVIDER_SITE_OTHER): Payer: Medicare Other

## 2019-08-02 VITALS — BP 122/79 | HR 72 | Temp 98.0°F | Ht 62.0 in | Wt 233.4 lb

## 2019-08-02 DIAGNOSIS — Z0000 Encounter for general adult medical examination without abnormal findings: Secondary | ICD-10-CM | POA: Diagnosis not present

## 2019-08-02 NOTE — Patient Instructions (Signed)
Barbara Fitzpatrick , Thank you for taking time to come for your Medicare Wellness Visit. I appreciate your ongoing commitment to your health goals. Please review the following plan we discussed and let me know if I can assist you in the future.   Screening recommendations/referrals: Colonoscopy: completed 07/01/2017, repeat in 5 years Mammogram: completed 07/03/2018, requested every 2 years Bone Density: completed 07/22/2018 Recommended yearly ophthalmology/optometry visit for glaucoma screening and checkup Recommended yearly dental visit for hygiene and checkup  Vaccinations: Influenza vaccine: due 08/2019 Pneumococcal vaccine: pneumovax 23 due, get at next in office visit Tdap vaccine: up to date Shingles vaccine: shingrix eligible, check with your insurance company for coverage     Advanced directives: Please bring a copy of your health care power of attorney and living will to the office at your convenience.  Conditions/risks identified: pre- diabetic  Next appointment: Follow up in one year for your annual wellness visit.    Preventive Care 67 Years and Older, Female Preventive care refers to lifestyle choices and visits with your health care provider that can promote health and wellness. What does preventive care include?  A yearly physical exam. This is also called an annual well check.  Dental exams once or twice a year.  Routine eye exams. Ask your health care provider how often you should have your eyes checked.  Personal lifestyle choices, including:  Daily care of your teeth and gums.  Regular physical activity.  Eating a healthy diet.  Avoiding tobacco and drug use.  Limiting alcohol use.  Practicing safe sex.  Taking low-dose aspirin every day.  Taking vitamin and mineral supplements as recommended by your health care provider. What happens during an annual well check? The services and screenings done by your health care provider during your annual well check  will depend on your age, overall health, lifestyle risk factors, and family history of disease. Counseling  Your health care provider may ask you questions about your:  Alcohol use.  Tobacco use.  Drug use.  Emotional well-being.  Home and relationship well-being.  Sexual activity.  Eating habits.  History of falls.  Memory and ability to understand (cognition).  Work and work Statistician.  Reproductive health. Screening  You may have the following tests or measurements:  Height, weight, and BMI.  Blood pressure.  Lipid and cholesterol levels. These may be checked every 5 years, or more frequently if you are over 1 years old.  Skin check.  Lung cancer screening. You may have this screening every year starting at age 18 if you have a 30-pack-year history of smoking and currently smoke or have quit within the past 15 years.  Fecal occult blood test (FOBT) of the stool. You may have this test every year starting at age 25.  Flexible sigmoidoscopy or colonoscopy. You may have a sigmoidoscopy every 5 years or a colonoscopy every 10 years starting at age 51.  Hepatitis C blood test.  Hepatitis B blood test.  Sexually transmitted disease (STD) testing.  Diabetes screening. This is done by checking your blood sugar (glucose) after you have not eaten for a while (fasting). You may have this done every 1-3 years.  Bone density scan. This is done to screen for osteoporosis. You may have this done starting at age 65.  Mammogram. This may be done every 1-2 years. Talk to your health care provider about how often you should have regular mammograms. Talk with your health care provider about your test results, treatment options, and if  necessary, the need for more tests. Vaccines  Your health care provider may recommend certain vaccines, such as:  Influenza vaccine. This is recommended every year.  Tetanus, diphtheria, and acellular pertussis (Tdap, Td) vaccine. You may  need a Td booster every 10 years.  Zoster vaccine. You may need this after age 57.  Pneumococcal 13-valent conjugate (PCV13) vaccine. One dose is recommended after age 74.  Pneumococcal polysaccharide (PPSV23) vaccine. One dose is recommended after age 23. Talk to your health care provider about which screenings and vaccines you need and how often you need them. This information is not intended to replace advice given to you by your health care provider. Make sure you discuss any questions you have with your health care provider. Document Released: 01/05/2016 Document Revised: 08/28/2016 Document Reviewed: 10/10/2015 Elsevier Interactive Patient Education  2017 [employer] Prevention in the Home Falls can cause injuries. They can happen to people of all ages. There are many things you can do to make your home safe and to help prevent falls. What can I do on the outside of my home?  Regularly fix the edges of walkways and driveways and fix any cracks.  Remove anything that might make you trip as you walk through a door, such as a raised step or threshold.  Trim any bushes or trees on the path to your home.  Use bright outdoor lighting.  Clear any walking paths of anything that might make someone trip, such as rocks or tools.  Regularly check to see if handrails are loose or broken. Make sure that both sides of any steps have handrails.  Any raised decks and porches should have guardrails on the edges.  Have any leaves, snow, or ice cleared regularly.  Use sand or salt on walking paths during winter.  Clean up any spills in your garage right away. This includes oil or grease spills. What can I do in the bathroom?  Use night lights.  Install grab bars by the toilet and in the tub and shower. Do not use towel bars as grab bars.  Use non-skid mats or decals in the tub or shower.  If you need to sit down in the shower, use a plastic, non-slip stool.  Keep the floor  dry. Clean up any water that spills on the floor as soon as it happens.  Remove soap buildup in the tub or shower regularly.  Attach bath mats securely with double-sided non-slip rug tape.  Do not have throw rugs and other things on the floor that can make you trip. What can I do in the bedroom?  Use night lights.  Make sure that you have a light by your bed that is easy to reach.  Do not use any sheets or blankets that are too big for your bed. They should not hang down onto the floor.  Have a firm chair that has side arms. You can use this for support while you get dressed.  Do not have throw rugs and other things on the floor that can make you trip. What can I do in the kitchen?  Clean up any spills right away.  Avoid walking on wet floors.  Keep items that you use a lot in easy-to-reach places.  If you need to reach something above you, use a strong step stool that has a grab bar.  Keep electrical cords out of the way.  Do not use floor polish or wax that makes floors slippery. If you must  use wax, use non-skid floor wax.  Do not have throw rugs and other things on the floor that can make you trip. What can I do with my stairs?  Do not leave any items on the stairs.  Make sure that there are handrails on both sides of the stairs and use them. Fix handrails that are broken or loose. Make sure that handrails are as long as the stairways.  Check any carpeting to make sure that it is firmly attached to the stairs. Fix any carpet that is loose or worn.  Avoid having throw rugs at the top or bottom of the stairs. If you do have throw rugs, attach them to the floor with carpet tape.  Make sure that you have a light switch at the top of the stairs and the bottom of the stairs. If you do not have them, ask someone to add them for you. What else can I do to help prevent falls?  Wear shoes that:  Do not have high heels.  Have rubber bottoms.  Are comfortable and fit you  well.  Are closed at the toe. Do not wear sandals.  If you use a stepladder:  Make sure that it is fully opened. Do not climb a closed stepladder.  Make sure that both sides of the stepladder are locked into place.  Ask someone to hold it for you, if possible.  Clearly mark and make sure that you can see:  Any grab bars or handrails.  First and last steps.  Where the edge of each step is.  Use tools that help you move around (mobility aids) if they are needed. These include:  Canes.  Walkers.  Scooters.  Crutches.  Turn on the lights when you go into a dark area. Replace any light bulbs as soon as they burn out.  Set up your furniture so you have a clear path. Avoid moving your furniture around.  If any of your floors are uneven, fix them.  If there are any pets around you, be aware of where they are.  Review your medicines with your doctor. Some medicines can make you feel dizzy. This can increase your chance of falling. Ask your doctor what other things that you can do to help prevent falls. This information is not intended to replace advice given to you by your health care provider. Make sure you discuss any questions you have with your health care provider. Document Released: 10/05/2009 Document Revised: 05/16/2016 Document Reviewed: 01/13/2015 Elsevier Interactive Patient Education  2017 [employer].

## 2019-08-02 NOTE — Progress Notes (Signed)
Subjective:   Barbara Fitzpatrick is a 68 y.o. female who presents for Medicare Annual (Subsequent) preventive examination.  This visit is being conducted via phone call  - after an attmept to do on video chat - due to the COVID-19 pandemic. This patient has given me verbal consent via phone to conduct this visit, patient states they are participating from their home address. Some vital signs may be absent or patient reported.   Patient identification: identified by name, DOB, and current address.    Review of Systems:   Cardiac Risk Factors include: advanced age (>63men, >55 women);hypertension;diabetes mellitus;dyslipidemia;obesity (BMI >30kg/m2)     Objective:     Vitals: BP 122/79 Comment: pt reported  Pulse 72 Comment: pt reported  Temp 98 F (36.7 C) Comment: pt reported  Ht 5\' 2"  (1.575 m) Comment: pt reported  Wt 233 lb 6.4 oz (105.9 kg) Comment: pt reported  LMP  (LMP Unknown)   BMI 42.69 kg/m   Body mass index is 42.69 kg/m.  Advanced Directives 08/02/2019 06/15/2018 04/10/2017  Does Patient Have a Medical Advance Directive? Yes Yes Yes  Type of Advance Directive Living will;Healthcare Power of [place];Living will Living will;Healthcare Power of [place] in Chart? No - copy requested No - copy requested No - copy requested    Tobacco Social History   Tobacco Use  Smoking Status Never Smoker  Smokeless Tobacco Never Used     Counseling given: Not Answered   Clinical Intake:  Pre-visit preparation completed: Yes  Pain : No/denies pain     Nutritional Status: BMI > 30  Obese Diabetes: No  How often do you need to have someone help you when you read instructions, pamphlets, or other written materials from your doctor or pharmacy?: 1 - Never What is the last grade level you completed in school?: high school   Interpreter Needed?: No  Information entered by :: Emory Gallentine,LPN  Past  Medical History:  Diagnosis Date  . Arthritis   . Depression   . GERD (gastroesophageal reflux disease)   . Hyperlipidemia   . Hypertension   . Personal history of colonic adenomas 06/16/2013   4 adenomas (max 1 cm)  04/2003  no polyps 04/2005   . Sleep apnea    uses c-pap   Past Surgical History:  Procedure Laterality Date  . COLONOSCOPY    . hysterectomy     with bladder tack  . TUBAL LIGATION     Family History  Problem Relation Age of Onset  . Hypertension Mother   . Hyperlipidemia Mother   . Diabetes Mother   . Cancer Mother        colon (benign)  . Glaucoma Mother   . Heart disease Father   . Hypertension Father   . Hyperlipidemia Father   . Diabetes Father   . Diabetes Sister   . Lung disease Sister        polyps  . Cancer Brother        kidney  . Glaucoma Brother   . Hyperlipidemia Brother   . Hypertension Brother   . Stroke Maternal Grandfather   . Diabetes Paternal Grandmother   . Heart disease Paternal Grandfather   . Cancer Maternal Aunt 50       ovarian  . Colon cancer Neg Hx   . Esophageal cancer Neg Hx   . Rectal cancer Neg Hx   . Stomach cancer Neg Hx  Social History   Socioeconomic History  . Marital status: Single    Spouse name: Not on file  . Number of children: Not on file  . Years of education: Not on file  . Highest education level: 12th grade  Occupational History  . Occupation: retired  Scientific laboratory technician  . Financial resource strain: Not hard at all  . Food insecurity    Worry: Never true    Inability: Never true  . Transportation needs    Medical: No    Non-medical: No  Tobacco Use  . Smoking status: Never Smoker  . Smokeless tobacco: Never Used  Substance and Sexual Activity  . Alcohol use: No  . Drug use: No  . Sexual activity: Never  Lifestyle  . Physical activity    Days per week: 3 days    Minutes per session: 20 min  . Stress: Not at all  Relationships  . Social Herbalist on phone: Once a week     Gets together: More than three times a week    Attends religious service: More than 4 times per year    Active member of club or organization: Yes    Attends meetings of clubs or organizations: More than 4 times per year    Relationship status: Widowed  Other Topics Concern  . Not on file  Social History Narrative  . Not on file    Outpatient Encounter Medications as of 08/02/2019  Medication Sig  . aluminum chloride (DRYSOL) 20 % external solution Apply topically at bedtime.  Marland Kitchen aspirin 81 MG tablet Take 81 mg by mouth daily.  Marland Kitchen atorvastatin (LIPITOR) 40 MG tablet TAKE 1 TABLET BY MOUTH EVERY DAY  . buPROPion (WELLBUTRIN XL) 300 MG 24 hr tablet TAKE 1 TABLET(300 MG) BY MOUTH DAILY  . cetirizine (ZYRTEC) 10 MG tablet Take 10 mg by mouth daily.  . Cholecalciferol (VITAMIN D-3 PO) Take 3,000 Units by mouth daily.   . Coenzyme Q10 (CO Q-10 PO) Take by mouth daily.  . cyclobenzaprine (FLEXERIL) 10 MG tablet Take 1 tablet (10 mg total) by mouth 3 (three) times daily as needed for muscle spasms.  Marland Kitchen glucosamine-chondroitin 500-400 MG tablet Take 1 tablet by mouth 2 (two) times daily.  . hydrochlorothiazide (HYDRODIURIL) 25 MG tablet TAKE 1 TABLET BY MOUTH EVERY DAY  . Magnesium 250 MG TABS Take by mouth daily.   . Methylcellulose, Laxative, (FIBER THERAPY PO) Take 1 tablet by mouth daily.  . Multiple Vitamins-Minerals (MULTIVITAMIN PO) Take by mouth daily.  Marland Kitchen nystatin (NYSTATIN) powder Apply topically 4 (four) times daily.  Marland Kitchen nystatin cream (MYCOSTATIN) Apply 1 application topically 2 (two) times daily.  . Olopatadine HCl 0.6 % SOLN U 2 SPRAYS IEN BID  . omeprazole (PRILOSEC) 20 MG capsule Take 20 mg by mouth daily.  . pramipexole (MIRAPEX) 0.5 MG tablet Take 1 tablet (0.5 mg total) by mouth at bedtime as needed.  . Probiotic Product (PROBIOTIC COLON SUPPORT PO) Take by mouth. Take one pill every other day  . quinapril (ACCUPRIL) 40 MG tablet TAKE 1 TABLET BY MOUTH AT BEDTIME  . vitamin B-12  (CYANOCOBALAMIN) 1000 MCG tablet Take 1,000 mcg by mouth daily.  . vitamin E (VITAMIN E) 1000 UNIT capsule Take 1,000 Units by mouth daily.  Marland Kitchen acyclovir ointment (ZOVIRAX) 5 % Apply 1 application topically 3 (three) times daily as needed. (Patient not taking: Reported on 08/02/2019)  . EPIPEN 2-PAK 0.3 MG/0.3ML SOAJ injection as needed.   . valACYclovir (  VALTREX) 1000 MG tablet Take one tablet twice daily at the first sign of a flare for about 3-5 days (Patient not taking: Reported on 08/02/2019)   No facility-administered encounter medications on file as of 08/02/2019.     Activities of Daily Living In your present state of health, do you have any difficulty performing the following activities: 08/02/2019 11/17/2018  Hearing? N N  Comment no hearing aids -  Vision? N N  Comment wears glasses -  Difficulty concentrating or making decisions? N N  Walking or climbing stairs? N N  Dressing or bathing? N N  Doing errands, shopping? N N  Preparing Food and eating ? N -  Using the Toilet? N -  In the past six months, have you accidently leaked urine? Y -  Comment wears pads for protection -  Do you have problems with loss of bowel control? N -  Managing your Medications? N -  Managing your Finances? N -  Housekeeping or managing your Housekeeping? N -  Some recent data might be hidden    Patient Care Team: Venita Lick, NP as PCP - General (Nurse Practitioner)    Assessment:   This is a routine wellness examination for Barbara Fitzpatrick.  Exercise Activities and Dietary recommendations Current Exercise Habits: Home exercise routine, Type of exercise: walking;stretching, Time (Minutes): 20, Frequency (Times/Week): 3, Weekly Exercise (Minutes/Week): 60, Intensity: Mild, Exercise limited by: None identified  Goals    . DIET - INCREASE WATER INTAKE     Recommend drinking at least 6-8 glasses of water a day        Fall Risk: Fall Risk  08/02/2019 09/14/2018 06/15/2018 05/05/2018 11/03/2017   Falls in the past year? 0 No No No No    FALL RISK PREVENTION PERTAINING TO THE HOME:  Any stairs in or around the home? yes If so, are there any without handrails? No   Home free of loose throw rugs in walkways, pet beds, electrical cords, etc? Yes  Adequate lighting in your home to reduce risk of falls? Yes   ASSISTIVE DEVICES UTILIZED TO PREVENT FALLS:  Life alert? No  Use of a cane, walker or w/c? No  Grab bars in the bathroom? yes Shower chair or bench in shower? yes Elevated toilet seat or a handicapped toilet? No   TIMED UP AND GO:  Unable to perform    Depression Screen PHQ 2/9 Scores 08/02/2019 05/18/2019 11/17/2018 09/14/2018  PHQ - 2 Score 2 2 2 2   PHQ- 9 Score 2 5 9 13      Cognitive Function     6CIT Screen 06/15/2018  What Year? 0 points  What month? 0 points  What time? 0 points  Count back from 20 0 points  Months in reverse 0 points  Repeat phrase 0 points  Total Score 0    Immunization History  Administered Date(s) Administered  . Influenza, High Dose Seasonal PF 11/03/2017, 09/14/2018  . Influenza,inj,Quad PF,6+ Mos 09/15/2015, 10/15/2016  . Pneumococcal Conjugate-13 11/03/2017  . Tdap 04/23/2012  . Zoster 03/16/2014    Qualifies for Shingles Vaccine? Yes  Zostavax completed 03/16/2014. Due for Shingrix. Education has been provided regarding the importance of this vaccine. Pt has been advised to call insurance company to determine out of pocket expense. Advised may also receive vaccine at local pharmacy or Health Dept. Verbalized acceptance and understanding.  Tdap: up to date   Flu Vaccine: Due 08/2019   Pneumococcal Vaccine: up to date   Screening Tests Health  Maintenance  Topic Date Due  . FOOT EXAM  07/[birth year]/1962  . PNA vac Low Risk Adult (2 of 2 - PPSV23) 11/03/2018  . HEMOGLOBIN A1C  05/18/2019  . INFLUENZA VACCINE  07/24/2019  . OPHTHALMOLOGY EXAM  08/02/2019  . MAMMOGRAM  07/03/2020  . TETANUS/TDAP  04/23/2022  . COLONOSCOPY   07/01/2022  . DEXA SCAN  Completed  . Hepatitis C Screening  Completed    Cancer Screenings:  Colorectal Screening: Completed 07/01/2017. Repeat every 5 years  Mammogram: Completed 07/03/2018. Repeat every year. Pt requests to do every 2 years   Bone Density: Completed 07/22/2018   Lung Cancer Screening: (Low Dose CT Chest recommended if Age 43-80 years, 30 pack-year currently smoking OR have quit w/in 15years.) does not qualify.     Additional Screening:  Hepatitis C Screening: does qualify; Completed 09/15/2015  Dental Screening: Recommended annual dental exams for proper oral hygiene   Community Resource Referral:  CRR required this visit?  No       Plan:  I have personally reviewed and addressed the Medicare Annual Wellness questionnaire and have noted the following in the patient's chart:  A. Medical and social history B. Use of alcohol, tobacco or illicit drugs  C. Current medications and supplements D. Functional ability and status E.  Nutritional status F.  Physical activity G. Advance directives H. List of other physicians I.  Hospitalizations, surgeries, and ER visits in previous 12 months J.  [place] such as hearing and vision if needed, cognitive and depression L. Referrals and appointments   In addition, I have reviewed and discussed with patient certain preventive protocols, quality metrics, and best practice recommendations. A written personalized care plan for preventive services as well as general preventive health recommendations were provided to patient.   Signed,    Bevelyn Ngo, LPN  04/30/3266 Nurse Health Advisor   Nurse Notes: needs refill on quinapril, pramipexole, hydrochlorothiazide, and valtrex. cpe isnt scheduled until 11/2019

## 2019-09-06 ENCOUNTER — Other Ambulatory Visit: Payer: Self-pay

## 2019-09-06 MED ORDER — HYDROCHLOROTHIAZIDE 25 MG PO TABS: 25.0000 mg | ORAL_TABLET | Freq: Every day | ORAL | 0 refills | Status: DC

## 2019-09-15 ENCOUNTER — Telehealth: Payer: Self-pay | Admitting: Nurse Practitioner

## 2019-09-15 NOTE — Telephone Encounter (Signed)
Copied from [place] [id number]. Topic: General - Inquiry >> Sep 14, 2019  4:33 PM Barbara Fitzpatrick, [place] wrote: Reason for CRM: Patient called in stating she is wanting to have a 2nd shingles vaccine and pneumonia shot to be done at upcoming appointment 9/28. Please advise.

## 2019-09-15 NOTE — Telephone Encounter (Signed)
Called and spoke to patient. Let her know that we do have the pneumonia vaccine in the office. We also have shingrix but just got this vaccine and have not given the first yet. Let her know that we would let her know at her appointment if we could give that to her or if she would need to go to the pharmacy.

## 2019-09-15 NOTE — Telephone Encounter (Signed)
Will discuss with nursing staff.

## 2019-09-20 ENCOUNTER — Ambulatory Visit: Payer: Medicare Other

## 2019-09-22 ENCOUNTER — Ambulatory Visit (INDEPENDENT_AMBULATORY_CARE_PROVIDER_SITE_OTHER): Payer: Medicare Other

## 2019-09-22 ENCOUNTER — Other Ambulatory Visit: Payer: Self-pay

## 2019-09-22 DIAGNOSIS — Z23 Encounter for immunization: Secondary | ICD-10-CM | POA: Diagnosis not present

## 2019-10-13 ENCOUNTER — Other Ambulatory Visit: Payer: Self-pay

## 2019-10-13 MED ORDER — QUINAPRIL HCL 40 MG PO TABS: 40.0000 mg | ORAL_TABLET | Freq: Every day | ORAL | 1 refills | Status: DC

## 2019-10-13 NOTE — Telephone Encounter (Signed)
[facility] faxed a Rx refill request on Quinapril 40 mg tablets

## 2019-11-17 ENCOUNTER — Other Ambulatory Visit: Payer: Self-pay

## 2019-11-23 ENCOUNTER — Encounter: Payer: Medicare Other | Admitting: Nurse Practitioner

## 2019-12-03 ENCOUNTER — Other Ambulatory Visit: Payer: Self-pay

## 2019-12-03 MED ORDER — ATORVASTATIN CALCIUM 40 MG PO TABS: 40.0000 mg | ORAL_TABLET | Freq: Every day | ORAL | 3 refills | Status: DC

## 2019-12-03 MED ORDER — BUPROPION HCL ER (XL) 300 MG PO TB24: ORAL_TABLET | ORAL | 3 refills | Status: DC

## 2019-12-03 NOTE — Telephone Encounter (Signed)
Patient last seen 05/18/19 and has follow up in January.

## 2019-12-07 ENCOUNTER — Other Ambulatory Visit: Payer: Self-pay

## 2019-12-07 ENCOUNTER — Other Ambulatory Visit: Payer: Self-pay | Admitting: Nurse Practitioner

## 2019-12-07 MED ORDER — HYDROCHLOROTHIAZIDE 25 MG PO TABS: 25.0000 mg | ORAL_TABLET | Freq: Every day | ORAL | 2 refills | Status: DC

## 2019-12-07 MED ORDER — VALACYCLOVIR HCL 1 G PO TABS: ORAL_TABLET | ORAL | 6 refills | Status: DC

## 2020-01-11 ENCOUNTER — Other Ambulatory Visit: Payer: Self-pay

## 2020-01-11 ENCOUNTER — Ambulatory Visit (INDEPENDENT_AMBULATORY_CARE_PROVIDER_SITE_OTHER): Payer: Medicare PPO | Admitting: Nurse Practitioner

## 2020-01-11 ENCOUNTER — Encounter: Payer: Self-pay | Admitting: Nurse Practitioner

## 2020-01-11 VITALS — BP 138/80 | HR 82 | Temp 98.1°F | Ht 63.78 in | Wt 238.2 lb

## 2020-01-11 DIAGNOSIS — E538 Deficiency of other specified B group vitamins: Secondary | ICD-10-CM

## 2020-01-11 DIAGNOSIS — Z0000 Encounter for general adult medical examination without abnormal findings: Secondary | ICD-10-CM

## 2020-01-11 DIAGNOSIS — Z6841 Body Mass Index (BMI) 40.0 and over, adult: Secondary | ICD-10-CM

## 2020-01-11 DIAGNOSIS — R7303 Prediabetes: Secondary | ICD-10-CM

## 2020-01-11 DIAGNOSIS — F322 Major depressive disorder, single episode, severe without psychotic features: Secondary | ICD-10-CM

## 2020-01-11 DIAGNOSIS — M85852 Other specified disorders of bone density and structure, left thigh: Secondary | ICD-10-CM

## 2020-01-11 DIAGNOSIS — G4733 Obstructive sleep apnea (adult) (pediatric): Secondary | ICD-10-CM

## 2020-01-11 DIAGNOSIS — R0989 Other specified symptoms and signs involving the circulatory and respiratory systems: Secondary | ICD-10-CM

## 2020-01-11 DIAGNOSIS — E782 Mixed hyperlipidemia: Secondary | ICD-10-CM

## 2020-01-11 DIAGNOSIS — M85851 Other specified disorders of bone density and structure, right thigh: Secondary | ICD-10-CM

## 2020-01-11 DIAGNOSIS — I10 Essential (primary) hypertension: Secondary | ICD-10-CM | POA: Diagnosis not present

## 2020-01-11 DIAGNOSIS — K219 Gastro-esophageal reflux disease without esophagitis: Secondary | ICD-10-CM

## 2020-01-11 DIAGNOSIS — Z1231 Encounter for screening mammogram for malignant neoplasm of breast: Secondary | ICD-10-CM

## 2020-01-11 LAB — MICROALBUMIN, URINE WAIVED
Creatinine, Urine Waived: 50 mg/dL (ref 10–300)
Microalb, Ur Waived: 30 mg/L — ABNORMAL HIGH (ref 0–19)

## 2020-01-11 NOTE — Assessment & Plan Note (Signed)
Adherent to CPAP regimen.  Continue regimen and praised for continued use.

## 2020-01-11 NOTE — Assessment & Plan Note (Signed)
Chronic, stable.  Continue current PPI dose and consider GDR in upcoming months.  Mag level today.

## 2020-01-11 NOTE — Assessment & Plan Note (Signed)
Recommend continued focus on healthy diet choices and regular physical activity (30 minutes 5 days a week).  

## 2020-01-11 NOTE — Assessment & Plan Note (Signed)
Chronic, ongoing.  Continue current medication regimen, Wellbutrin 300 MG daily.  PHQ9 today = 13.  Denies SI/HI.  She does not wish to adjust anything at this time.

## 2020-01-11 NOTE — Progress Notes (Signed)
BP 138/80 (BP Location: Left Arm, Cuff Size: Normal)   Pulse 82   Temp 98.1 F (36.7 C) (Oral)   Ht 5' 3.78" (1.62 m)   Wt 238 lb 3.2 oz (108 kg)   LMP  (LMP Unknown)   SpO2 98%   BMI 41.17 kg/m    Subjective:    Patient ID: Barbara Fitzpatrick, female    DOB: [birth year], 69 y.o.   MRN: [id number]  HPI: Barbara Fitzpatrick is a 70 y.o. female presenting on 01/11/2020 for comprehensive medical examination. Current medical complaints include:none  She currently lives with: self Menopausal Symptoms: no   HYPERTENSION/HLD Continues on Accupril 40 MG + HCTZ 25 MG & Lipitor 40 MG.  Does have CPAP and uses 100% of the time. Hypertension status: controlled  Satisfied with current treatment? yes Duration of hypertension: chronic BP monitoring frequency:  rarely BP range: 121/83 & 151/83 -- average 130/80 BP medication side effects:  no Medication compliance: good compliance Previous BP meds: none Aspirin: yes Recurrent headaches: none, only has if elevated BP Visual changes: no Palpitations: no Dyspnea: no Chest pain: no Lower extremity edema: no Dizzy/lightheaded: no   IFG: Last A1C in December 2019 was 6.2%.  Has had elevated glucose readings on past CMP, BMP testing.  Often in mid to high 120 range on review.  Has not been doing a lot of exercise due to Covid and endorses she has been craving more sweets. Polydipsia/polyuria: yes Visual disturbance: no Chest pain: no Paresthesias: no   GERD: Chronic, stable on current medication regimen (Omeprazole).  Denies N&V, heart burn, or dysphagia. GERD control status: stable  Satisfied with current treatment? yes Heartburn frequency: none Medication side effects: no  Medication compliance: stable Previous GERD medications: none Dysphagia: no Odynophagia:  no Hematemesis: no Blood in stool: no EGD: yes   OSTEOPENIA: Noticed on DEXA in 2019, continues on Vit D and CA+ daily.  No recent falls or  fractures.   DEPRESSION Reports good control with Wellbutrin, wishes to maintain current dose although has noted weight gain with it. Mood status: controlled Satisfied with current treatment?: is satisfied with medicaiton, but does report increase dose has caused weight gain Symptom severity: mild  Duration of current treatment : chronic Side effects: no Medication compliance: excellent compliance Psychotherapy/counseling: no never gone Previous psychiatric medications:none Depressed mood: no Anxious mood: no Anhedonia: no Significant weight loss or gain: yes Insomnia: no issues Fatigue: no Feelings of worthlessness or guilt: no Impaired concentration/indecisiveness: no Suicidal ideations: no Hopelessness: no Crying spells: none Depression Screen done today and results listed below:  Depression screen [facility] 2/9 01/11/2020 08/02/2019 05/18/2019 11/17/2018 09/14/2018  Decreased Interest 3 1 1 1 1   Down, Depressed, Hopeless 2 1 1 1 1   PHQ - 2 Score 5 2 2 2 2   Altered sleeping 0 0 0 2 2  Tired, decreased energy 3 0 1 1 2   Change in appetite 3 0 1 3 3   Feeling bad or failure about yourself  0 0 0 0 1  Trouble concentrating 0 0 1 0 2  Moving slowly or fidgety/restless 2 0 0 1 1  Suicidal thoughts 0 0 0 0 0  PHQ-9 Score 13 2 5 9 13   Difficult doing work/chores - Not difficult at all Not difficult at all Not difficult at all -  Some recent data might be hidden   GAD 7 : Generalized Anxiety Score 01/11/2020 11/17/2018  Nervous, Anxious, on Edge 0 1  Control/stop worrying  0 0  Worry too much - different things 0 0  Trouble relaxing 0 1  Restless 0 0  Easily annoyed or irritable 0 0  Afraid - awful might happen 0 0  Total GAD 7 Score 0 2  Anxiety Difficulty Not difficult at all -    The patient does not have a history of falls. I did not complete a risk assessment for falls. A plan of care for falls was not documented.   Past Medical History:  Past Medical History:  Diagnosis  Date  . Arthritis   . Depression   . GERD (gastroesophageal reflux disease)   . Hyperlipidemia   . Hypertension   . Personal history of colonic adenomas 06/16/2013   4 adenomas (max 1 cm)  04/2003  no polyps 04/2005   . Sleep apnea    uses c-pap    Surgical History:  Past Surgical History:  Procedure Laterality Date  . COLONOSCOPY    . hysterectomy     with bladder tack  . TUBAL LIGATION      Medications:  Current Outpatient Medications on File Prior to Visit  Medication Sig  . acyclovir ointment (ZOVIRAX) 5 % Apply 1 application topically 3 (three) times daily as needed.  Marland Kitchen aluminum chloride (DRYSOL) 20 % external solution Apply topically at bedtime.  Marland Kitchen aspirin 81 MG tablet Take 81 mg by mouth daily.  Marland Kitchen atorvastatin (LIPITOR) 40 MG tablet Take 1 tablet (40 mg total) by mouth daily.  Marland Kitchen buPROPion (WELLBUTRIN XL) 300 MG 24 hr tablet TAKE 1 TABLET(300 MG) BY MOUTH DAILY  . cetirizine (ZYRTEC) 10 MG tablet Take 10 mg by mouth daily.  . Cholecalciferol (VITAMIN D-3 PO) Take 3,000 Units by mouth daily.   . Coenzyme Q10 (CO Q-10 PO) Take by mouth daily.  . cyclobenzaprine (FLEXERIL) 10 MG tablet Take 1 tablet (10 mg total) by mouth 3 (three) times daily as needed for muscle spasms.  Marland Kitchen EPIPEN 2-PAK 0.3 MG/0.3ML SOAJ injection as needed.   Marland Kitchen glucosamine-chondroitin 500-400 MG tablet Take 1 tablet by mouth 2 (two) times daily.  . hydrochlorothiazide (HYDRODIURIL) 25 MG tablet Take 1 tablet (25 mg total) by mouth daily.  . Magnesium 250 MG TABS Take by mouth daily.   . Methylcellulose, Laxative, (FIBER THERAPY PO) Take 1 tablet by mouth daily.  . Multiple Vitamins-Minerals (MULTIVITAMIN PO) Take by mouth daily.  Marland Kitchen nystatin (NYSTATIN) powder Apply topically 4 (four) times daily.  Marland Kitchen nystatin cream (MYCOSTATIN) Apply 1 application topically 2 (two) times daily.  . Olopatadine HCl 0.6 % SOLN U 2 SPRAYS IEN BID  . omeprazole (PRILOSEC) 20 MG capsule Take 20 mg by mouth daily.  . pramipexole  (MIRAPEX) 0.5 MG tablet Take 1 tablet (0.5 mg total) by mouth at bedtime as needed.  . Probiotic Product (PROBIOTIC COLON SUPPORT PO) Take by mouth. Take one pill every other day  . quinapril (ACCUPRIL) 40 MG tablet Take 1 tablet (40 mg total) by mouth at bedtime.  . valACYclovir (VALTREX) 1000 MG tablet Take one tablet twice daily at the first sign of a flare for about 3-5 days  . vitamin B-12 (CYANOCOBALAMIN) 1000 MCG tablet Take 1,000 mcg by mouth daily.  . vitamin E (VITAMIN E) 1000 UNIT capsule Take 1,000 Units by mouth daily.   No current facility-administered medications on file prior to visit.    Allergies:  No Known Allergies  Social History:  Social History   Socioeconomic History  . Marital status: Single  Spouse name: Not on file  . Number of children: Not on file  . Years of education: Not on file  . Highest education level: 12th grade  Occupational History  . Occupation: retired  Tobacco Use  . Smoking status: Never Smoker  . Smokeless tobacco: Never Used  Substance and Sexual Activity  . Alcohol use: No  . Drug use: No  . Sexual activity: Never  Other Topics Concern  . Not on file  Social History Narrative  . Not on file   Social Determinants of Health   Financial Resource Strain:   . Difficulty of Paying Living Expenses: Not on file  Food Insecurity:   . Worried About Charity fundraiser in the Last Year: Not on file  . Ran Out of Food in the Last Year: Not on file  Transportation Needs:   . Lack of Transportation (Medical): Not on file  . Lack of Transportation (Non-Medical): Not on file  Physical Activity: Insufficiently Active  . Days of Exercise per Week: 3 days  . Minutes of Exercise per Session: 20 min  Stress:   . Feeling of Stress : Not on file  Social Connections:   . Frequency of Communication with Friends and Family: Not on file  . Frequency of Social Gatherings with Friends and Family: Not on file  . Attends Religious Services: Not  on file  . Active Member of Clubs or Organizations: Not on file  . Attends Archivist Meetings: Not on file  . Marital Status: Not on file  Intimate Partner Violence:   . Fear of Current or Ex-Partner: Not on file  . Emotionally Abused: Not on file  . Physically Abused: Not on file  . Sexually Abused: Not on file   Social History   Tobacco Use  Smoking Status Never Smoker  Smokeless Tobacco Never Used   Social History   Substance and Sexual Activity  Alcohol Use No    Family History:  Family History  Problem Relation Age of Onset  . Hypertension Mother   . Hyperlipidemia Mother   . Diabetes Mother   . Cancer Mother        colon (benign)  . Glaucoma Mother   . Heart disease Father   . Hypertension Father   . Hyperlipidemia Father   . Diabetes Father   . Diabetes Sister   . Lung disease Sister        polyps  . Cancer Brother        kidney  . Glaucoma Brother   . Hyperlipidemia Brother   . Hypertension Brother   . Stroke Maternal Grandfather   . Diabetes Paternal Grandmother   . Heart disease Paternal Grandfather   . Cancer Maternal Aunt 50       ovarian  . Colon cancer Neg Hx   . Esophageal cancer Neg Hx   . Rectal cancer Neg Hx   . Stomach cancer Neg Hx     Past medical history, surgical history, medications, allergies, family history and social history reviewed with patient today and changes made to appropriate areas of the chart.   Review of Systems - negative All other ROS negative except what is listed above and in the HPI.      Objective:    BP 138/80 (BP Location: Left Arm, Cuff Size: Normal)   Pulse 82   Temp 98.1 F (36.7 C) (Oral)   Ht 5' 3.78" (1.62 m)   Wt 238 lb 3.2 oz (108 kg)  LMP  (LMP Unknown)   SpO2 98%   BMI 41.17 kg/m   Wt Readings from Last 3 Encounters:  01/11/20 238 lb 3.2 oz (108 kg)  08/02/19 233 lb 6.4 oz (105.9 kg)  05/18/19 233 lb 12.8 oz (106.1 kg)    Physical Exam Constitutional:      General: She is  awake. She is not in acute distress.    Appearance: She is well-developed. She is not ill-appearing.  HENT:     Head: Normocephalic and atraumatic.     Right Ear: Hearing, tympanic membrane, ear canal and external ear normal. No drainage.     Left Ear: Hearing, tympanic membrane, ear canal and external ear normal. No drainage.     Nose: Nose normal.     Right Sinus: No maxillary sinus tenderness or frontal sinus tenderness.     Left Sinus: No maxillary sinus tenderness or frontal sinus tenderness.     Mouth/Throat:     Mouth: Mucous membranes are moist.     Pharynx: Oropharynx is clear. Uvula midline. No pharyngeal swelling, oropharyngeal exudate or posterior oropharyngeal erythema.  Eyes:     General: Lids are normal.        Right eye: No discharge.        Left eye: No discharge.     Extraocular Movements: Extraocular movements intact.     Conjunctiva/sclera: Conjunctivae normal.     Pupils: Pupils are equal, round, and reactive to light.     Visual Fields: Right eye visual fields normal and left eye visual fields normal.  Neck:     Thyroid: No thyromegaly.     Vascular: Carotid bruit (bilaterally R>L) present.     Trachea: Trachea normal.  Cardiovascular:     Rate and Rhythm: Normal rate and regular rhythm.     Heart sounds: Normal heart sounds. No murmur. No gallop.   Pulmonary:     Effort: Pulmonary effort is normal. No accessory muscle usage or respiratory distress.     Breath sounds: Normal breath sounds.  Chest:     Breasts:        Right: Normal.        Left: Normal.  Abdominal:     General: Bowel sounds are normal.     Palpations: Abdomen is soft. There is no hepatomegaly or splenomegaly.     Tenderness: There is no abdominal tenderness.  Musculoskeletal:        General: Normal range of motion.     Cervical back: Normal range of motion and neck supple.     Right lower leg: No edema.     Left lower leg: No edema.  Lymphadenopathy:     Head:     Right side of head:  No submental, submandibular, tonsillar, preauricular or posterior auricular adenopathy.     Left side of head: No submental, submandibular, tonsillar, preauricular or posterior auricular adenopathy.     Cervical: No cervical adenopathy.     Upper Body:     Right upper body: No supraclavicular, axillary or pectoral adenopathy.     Left upper body: No supraclavicular, axillary or pectoral adenopathy.  Skin:    General: Skin is warm and dry.     Capillary Refill: Capillary refill takes less than 2 seconds.     Findings: No rash.  Neurological:     Mental Status: She is alert and oriented to person, place, and time.     Cranial Nerves: Cranial nerves are intact.     Gait:  Gait is intact.     Deep Tendon Reflexes: Reflexes are normal and symmetric.     Reflex Scores:      Brachioradialis reflexes are 2+ on the right side and 2+ on the left side.      Patellar reflexes are 2+ on the right side and 2+ on the left side. Psychiatric:        Attention and Perception: Attention normal.        Mood and Affect: Mood normal.        Speech: Speech normal.        Behavior: Behavior normal. Behavior is cooperative.        Thought Content: Thought content normal.        Judgment: Judgment normal.    Diabetic Foot Exam - Simple   Simple Foot Form Visual Inspection No deformities, no ulcerations, no other skin breakdown bilaterally: Yes Sensation Testing Intact to touch and monofilament testing bilaterally: Yes Pulse Check Posterior Tibialis and Dorsalis pulse intact bilaterally: Yes Comments     Results for orders placed or performed in visit on 01/11/20  Microalbumin, Urine Waived  Result Value Ref Range   Microalb, Ur Waived 30 (H) 0 - 19 mg/L   Creatinine, Urine Waived 50 10 - 300 mg/dL   Microalb/Creat Ratio 30-300 (H) <30 mg/g      Assessment & Plan:   Problem List Items Addressed This Visit      Cardiovascular and Mediastinum   Benign hypertension    Chronic, stable with BP at  goal in office and on home readings.  Continue current medication regimen and adjust as needed.  Continue to monitor BP at home regularly.  CMP today.  Return in 6 months.      Relevant Orders   CBC with Differential/Platelet   Comprehensive metabolic panel   TSH     Respiratory   Sleep apnea    Adherent to CPAP regimen.  Continue regimen and praised for continued use.        Digestive   GERD (gastroesophageal reflux disease)    Chronic, stable.  Continue current PPI dose and consider GDR in upcoming months.  Mag level today.      Relevant Orders   Magnesium     Musculoskeletal and Integument   Osteopenia    Chronic, continue daily supplements.  DEXA repeat in 2024.      Relevant Orders   VITAMIN D 25 Hydroxy (Vit-D Deficiency, Fractures)     Other   Prediabetes    Repeat A1C today, is having some symptoms.  If elevation initiate medication and have return to office in 4 weeks to assess tolerance.      Relevant Orders   Microalbumin, Urine Waived (Completed)   Hyperlipemia    Chronic, stable.  Continue current medication regimen and adjust as needed.  Lipid panel today.      Relevant Orders   Comprehensive metabolic panel   Lipid Panel w/o Chol/HDL Ratio   Obesity    Recommend continued focus on healthy diet choices and regular physical activity (30 minutes 5 days a week).       Depression, major, single episode, severe (HCC)    Chronic, ongoing.  Continue current medication regimen, Wellbutrin 300 MG daily.  PHQ9 today = 13.  Denies SI/HI.  She does not wish to adjust anything at this time.      Bilateral carotid bruits    Referral to vascular for further evaluation and recommendations.  Discussed at length  with patient and educated her on bruits.      Relevant Orders   Ambulatory referral to Vascular Surgery    Other Visit Diagnoses    Annual physical exam    -  Primary   Vitamin B12 deficiency       History of low level, continue supplement and  recheck level today.   Relevant Orders   Vitamin B12   Encounter for screening mammogram for malignant neoplasm of breast       Relevant Orders   MM DIGITAL SCREENING BILATERAL       Follow up plan: Return in about 6 months (around 07/10/2020) for HTN/HLD, Mood.   LABORATORY TESTING:  - Pap smear: not applicable  IMMUNIZATIONS:   - Tdap: Tetanus vaccination status reviewed: last tetanus booster within 10 years. - Influenza: Up to date - Pneumovax: Up to date - Prevnar: Up to date - HPV: Not applicable - Zostavax vaccine: Refused  SCREENING: -Mammogram: Ordered today  - Colonoscopy: Up to date  - Bone Density: Up to date  -Hearing Test: Not applicable  -Spirometry: Not applicable   PATIENT COUNSELING:   Advised to take 1 mg of folate supplement per day if capable of pregnancy.   Sexuality: Discussed sexually transmitted diseases, partner selection, use of condoms, avoidance of unintended pregnancy  and contraceptive alternatives.   Advised to avoid cigarette smoking.  I discussed with the patient that most people either abstain from alcohol or drink within safe limits (<=14/week and <=4 drinks/occasion for males, <=7/weeks and <= 3 drinks/occasion for females) and that the risk for alcohol disorders and other health effects rises proportionally with the number of drinks per week and how often a drinker exceeds daily limits.  Discussed cessation/primary prevention of drug use and availability of treatment for abuse.   Diet: Encouraged to adjust caloric intake to maintain  or achieve ideal body weight, to reduce intake of dietary saturated fat and total fat, to limit sodium intake by avoiding high sodium foods and not adding table salt, and to maintain adequate dietary potassium and calcium preferably from fresh fruits, vegetables, and low-fat dairy products.    stressed the importance of regular exercise  Injury prevention: Discussed safety belts, safety helmets, smoke  detector, smoking near bedding or upholstery.   Dental health: Discussed importance of regular tooth brushing, flossing, and dental visits.    NEXT PREVENTATIVE PHYSICAL DUE IN 1 YEAR. Return in about 6 months (around 07/10/2020) for HTN/HLD, Mood.

## 2020-01-11 NOTE — Assessment & Plan Note (Signed)
Chronic, stable with BP at goal in office and on home readings.  Continue current medication regimen and adjust as needed.  Continue to monitor BP at home regularly.  CMP today.  Return in 6 months.

## 2020-01-11 NOTE — Patient Instructions (Addendum)
We are recommending the vaccine to everyone who has not had an allergic reaction to any of the components of the vaccine. If you have specific questions about the vaccine, please bring them up with your health care provider to discuss them.   We will likely not be getting the vaccine in the office for the first rounds of vaccinations. The way they are releasing the vaccines is going to be through the health systems (like [place], [place], [facility], [place]) or through your county health department.   The [facility] Department is giving vaccines to those 75+ starting 12/29/19  M-F 7AM to 4PM [place] and [place] [address], [place], [place] in a drive through tent  If you are 65+ you can get a vaccine through [facility] by signing up for an appointment.  You can sign up by going to: [email or web address].  You can get more information by going to: [email or web address]  [facility] at [facility]  Address: [address], [place], [place] [address]  Phone: [phone]

## 2020-01-11 NOTE — Assessment & Plan Note (Signed)
Chronic, continue daily supplements.  DEXA repeat in 2024.

## 2020-01-11 NOTE — Assessment & Plan Note (Signed)
Repeat A1C today, is having some symptoms.  If elevation initiate medication and have return to office in 4 weeks to assess tolerance.

## 2020-01-11 NOTE — Assessment & Plan Note (Signed)
Referral to vascular for further evaluation and recommendations.  Discussed at length with patient and educated her on bruits.

## 2020-01-11 NOTE — Assessment & Plan Note (Signed)
Chronic, stable.  Continue current medication regimen and adjust as needed.  Lipid panel today. 

## 2020-01-12 ENCOUNTER — Other Ambulatory Visit: Payer: Self-pay | Admitting: Nurse Practitioner

## 2020-01-12 DIAGNOSIS — R7303 Prediabetes: Secondary | ICD-10-CM

## 2020-01-12 LAB — COMPREHENSIVE METABOLIC PANEL
ALT: 34 IU/L — ABNORMAL HIGH (ref 0–32)
AST: 23 IU/L (ref 0–40)
Albumin/Globulin Ratio: 1.6 (ref 1.2–2.2)
Albumin: 4.1 g/dL (ref 3.8–4.8)
Alkaline Phosphatase: 103 IU/L (ref 39–117)
BUN/Creatinine Ratio: 17 (ref 12–28)
BUN: 15 mg/dL (ref 8–27)
Bilirubin Total: 0.4 mg/dL (ref 0.0–1.2)
CO2: 25 mmol/L (ref 20–29)
Calcium: 10.6 mg/dL — ABNORMAL HIGH (ref 8.7–10.3)
Chloride: 101 mmol/L (ref 96–106)
Creatinine, Ser: 0.87 mg/dL (ref 0.57–1.00)
GFR calc Af Amer: 80 mL/min/{1.73_m2} (ref >59)
GFR calc non Af Amer: 69 mL/min/{1.73_m2} (ref >59)
Globulin, Total: 2.5 g/dL (ref 1.5–4.5)
Glucose: 136 mg/dL — ABNORMAL HIGH (ref 65–99)
Potassium: 3.9 mmol/L (ref 3.5–5.2)
Sodium: 142 mmol/L (ref 134–144)
Total Protein: 6.6 g/dL (ref 6.0–8.5)

## 2020-01-12 LAB — CBC WITH DIFFERENTIAL/PLATELET
Basophils Absolute: 0 10*3/uL (ref 0.0–0.2)
Basos: 1 %
EOS (ABSOLUTE): 0.1 10*3/uL (ref 0.0–0.4)
Eos: 1 %
Hematocrit: 45.4 % (ref 34.0–46.6)
Hemoglobin: 15.5 g/dL (ref 11.1–15.9)
Immature Grans (Abs): 0 10*3/uL (ref 0.0–0.1)
Immature Granulocytes: 0 %
Lymphocytes Absolute: 2.2 10*3/uL (ref 0.7–3.1)
Lymphs: 26 %
MCH: 31.2 pg (ref 26.6–33.0)
MCHC: 34.1 g/dL (ref 31.5–35.7)
MCV: 91 fL (ref 79–97)
Monocytes Absolute: 0.6 10*3/uL (ref 0.1–0.9)
Monocytes: 7 %
Neutrophils Absolute: 5.3 10*3/uL (ref 1.4–7.0)
Neutrophils: 65 %
Platelets: 261 10*3/uL (ref 150–450)
RBC: 4.97 x10E6/uL (ref 3.77–5.28)
RDW: 12.8 % (ref 11.7–15.4)
WBC: 8.1 10*3/uL (ref 3.4–10.8)

## 2020-01-12 LAB — VITAMIN D 25 HYDROXY (VIT D DEFICIENCY, FRACTURES): Vit D, 25-Hydroxy: 30.5 ng/mL (ref 30.0–100.0)

## 2020-01-12 LAB — LIPID PANEL W/O CHOL/HDL RATIO
Cholesterol, Total: 184 mg/dL (ref 100–199)
HDL: 39 mg/dL — ABNORMAL LOW (ref >39)
LDL Chol Calc (NIH): 109 mg/dL — ABNORMAL HIGH (ref 0–99)
Triglycerides: 209 mg/dL — ABNORMAL HIGH (ref 0–149)
VLDL Cholesterol Cal: 36 mg/dL (ref 5–40)

## 2020-01-12 LAB — MAGNESIUM: Magnesium: 2 mg/dL (ref 1.6–2.3)

## 2020-01-12 LAB — TSH: TSH: 2.15 u[IU]/mL (ref 0.450–4.500)

## 2020-01-12 LAB — VITAMIN B12: Vitamin B-12: 1507 pg/mL — ABNORMAL HIGH (ref 232–1245)

## 2020-01-14 ENCOUNTER — Encounter: Payer: Self-pay | Admitting: Nurse Practitioner

## 2020-01-14 LAB — HGB A1C W/O EAG: Hgb A1c MFr Bld: 6.3 % — ABNORMAL HIGH (ref 4.8–5.6)

## 2020-01-14 LAB — SPECIMEN STATUS REPORT

## 2020-01-14 NOTE — Progress Notes (Signed)
Contacted via MyChart

## 2020-01-17 ENCOUNTER — Other Ambulatory Visit: Payer: Self-pay | Admitting: Nurse Practitioner

## 2020-01-17 DIAGNOSIS — Z1231 Encounter for screening mammogram for malignant neoplasm of breast: Secondary | ICD-10-CM

## 2020-01-18 ENCOUNTER — Ambulatory Visit (INDEPENDENT_AMBULATORY_CARE_PROVIDER_SITE_OTHER): Payer: Medicare PPO | Admitting: Vascular Surgery

## 2020-01-18 ENCOUNTER — Encounter (INDEPENDENT_AMBULATORY_CARE_PROVIDER_SITE_OTHER): Payer: Self-pay | Admitting: Vascular Surgery

## 2020-01-18 ENCOUNTER — Other Ambulatory Visit: Payer: Self-pay

## 2020-01-18 VITALS — BP 129/79 | HR 83 | Resp 12 | Ht 64.0 in | Wt 238.0 lb

## 2020-01-18 DIAGNOSIS — R0989 Other specified symptoms and signs involving the circulatory and respiratory systems: Secondary | ICD-10-CM | POA: Diagnosis not present

## 2020-01-18 DIAGNOSIS — I10 Essential (primary) hypertension: Secondary | ICD-10-CM

## 2020-01-18 DIAGNOSIS — E785 Hyperlipidemia, unspecified: Secondary | ICD-10-CM

## 2020-01-18 DIAGNOSIS — R7303 Prediabetes: Secondary | ICD-10-CM

## 2020-01-18 NOTE — Progress Notes (Signed)
MRN : [id number]  Barbara Fitzpatrick is a 69 y.o. (1950/12/27) female who presents with chief complaint of  Chief Complaint  Patient presents with  . New Patient (Initial Visit)    Cannnady. Carotid Bruit  .  History of Present Illness: Patient is referred for evaluation today from her primary care provider J. Cannady.  On her routine physical exam last week it was noted that she had bilateral carotid bruits present.  Patient says this had not been noted previously.  She has no complaints today and is in her usual state of health.  She denies any focal neurologic symptoms. Specifically, the patient denies amaurosis fugax, speech or swallowing difficulties, or arm or leg weakness or numbness   Current Outpatient Medications  Medication Sig Dispense Refill  . acyclovir ointment (ZOVIRAX) 5 % Apply 1 application topically 3 (three) times daily as needed. 15 g 1  . aspirin 81 MG tablet Take 81 mg by mouth daily.    Marland Kitchen atorvastatin (LIPITOR) 40 MG tablet Take 1 tablet (40 mg total) by mouth daily. 90 tablet 3  . buPROPion (WELLBUTRIN XL) 300 MG 24 hr tablet TAKE 1 TABLET(300 MG) BY MOUTH DAILY 90 tablet 3  . cetirizine (ZYRTEC) 10 MG tablet Take 10 mg by mouth daily.    . Coenzyme Q10 (CO Q-10 PO) Take by mouth daily.    . cyclobenzaprine (FLEXERIL) 10 MG tablet Take 1 tablet (10 mg total) by mouth 3 (three) times daily as needed for muscle spasms. 30 tablet 0  . EPIPEN 2-PAK 0.3 MG/0.3ML SOAJ injection as needed.     Marland Kitchen glucosamine-chondroitin 500-400 MG tablet Take 1 tablet by mouth 2 (two) times daily.    . hydrochlorothiazide (HYDRODIURIL) 25 MG tablet Take 1 tablet (25 mg total) by mouth daily. 90 tablet 2  . Magnesium 250 MG TABS Take by mouth daily.     . Multiple Vitamins-Minerals (MULTIVITAMIN PO) Take by mouth daily.    Marland Kitchen nystatin (NYSTATIN) powder Apply topically 4 (four) times daily. 60 g 1  . nystatin cream (MYCOSTATIN) Apply 1 application topically 2 (two) times daily. 60 g 1  .  Olopatadine HCl 0.6 % SOLN U 2 SPRAYS IEN BID    . omeprazole (PRILOSEC) 20 MG capsule Take 20 mg by mouth daily.    . pramipexole (MIRAPEX) 0.5 MG tablet Take 1 tablet (0.5 mg total) by mouth at bedtime as needed. 90 tablet 1  . Probiotic Product (PROBIOTIC COLON SUPPORT PO) Take by mouth. Take one pill every other day    . quinapril (ACCUPRIL) 40 MG tablet Take 1 tablet (40 mg total) by mouth at bedtime. 90 tablet 1  . valACYclovir (VALTREX) 1000 MG tablet Take one tablet twice daily at the first sign of a flare for about 3-5 days 20 tablet 6  . vitamin B-12 (CYANOCOBALAMIN) 1000 MCG tablet Take 1,000 mcg by mouth daily.    . vitamin E (VITAMIN E) 1000 UNIT capsule Take 1,000 Units by mouth daily.    Marland Kitchen aluminum chloride (DRYSOL) 20 % external solution Apply topically at bedtime. (Patient not taking: Reported on 01/18/2020) 60 mL 1  . Cholecalciferol (VITAMIN D-3 PO) Take 3,000 Units by mouth daily.     . Methylcellulose, Laxative, (FIBER THERAPY PO) Take 1 tablet by mouth daily.     No current facility-administered medications for this visit.    Past Medical History:  Diagnosis Date  . Arthritis   . Depression   . GERD (gastroesophageal reflux  disease)   . Hyperlipidemia   . Hypertension   . Personal history of colonic adenomas 06/16/2013   4 adenomas (max 1 cm)  04/2003  no polyps 04/2005   . Sleep apnea    uses c-pap    Past Surgical History:  Procedure Laterality Date  . COLONOSCOPY    . hysterectomy     with bladder tack  . TUBAL LIGATION       Social History   Tobacco Use  . Smoking status: Never Smoker  . Smokeless tobacco: Never Used  Substance Use Topics  . Alcohol use: No  . Drug use: No    Family History  Problem Relation Age of Onset  . Hypertension Mother   . Hyperlipidemia Mother   . Diabetes Mother   . Cancer Mother        colon (benign)  . Glaucoma Mother   . Heart disease Father   . Hypertension Father   . Hyperlipidemia Father   . Diabetes  Father   . Diabetes Sister   . Lung disease Sister        polyps  . Cancer Brother        kidney  . Glaucoma Brother   . Hyperlipidemia Brother   . Hypertension Brother   . Stroke Maternal Grandfather   . Diabetes Paternal Grandmother   . Heart disease Paternal Grandfather   . Cancer Maternal Aunt 50       ovarian  . Colon cancer Neg Hx   . Esophageal cancer Neg Hx   . Rectal cancer Neg Hx   . Stomach cancer Neg Hx      No Known Allergies   REVIEW OF SYSTEMS (Negative unless checked)  Constitutional: [] Weight loss  [] Fever  [] Chills Cardiac: [] Chest pain   [] Chest pressure   [] Palpitations   [] Shortness of breath when laying flat   [] Shortness of breath at rest   [] Shortness of breath with exertion. Vascular:  [] Pain in legs with walking   [] Pain in legs at rest   [] Pain in legs when laying flat   [] Claudication   [] Pain in feet when walking  [] Pain in feet at rest  [] Pain in feet when laying flat   [] History of DVT   [] Phlebitis   [] Swelling in legs   [] Varicose veins   [] Non-healing ulcers Pulmonary:   [] Uses home oxygen   [] Productive cough   [] Hemoptysis   [] Wheeze  [] COPD   [] Asthma Neurologic:  [] Dizziness  [] Blackouts   [] Seizures   [] History of stroke   [] History of TIA  [] Aphasia   [] Temporary blindness   [] Dysphagia   [] Weakness or numbness in arms   [] Weakness or numbness in legs Musculoskeletal:  [x] Arthritis   [] Joint swelling   [x] Joint pain   [] Low back pain Hematologic:  [] Easy bruising  [] Easy bleeding   [] Hypercoagulable state   [] Anemic  [] Hepatitis Gastrointestinal:  [] Blood in stool   [] Vomiting blood  [x] Gastroesophageal reflux/heartburn   [] Difficulty swallowing. Genitourinary:  [] Chronic kidney disease   [] Difficult urination  [] Frequent urination  [] Burning with urination   [] Blood in urine Skin:  [] Rashes   [] Ulcers   [] Wounds Psychological:  [] History of anxiety   [x]  History of major depression.  Physical Examination  Vitals:   01/18/20 0906  BP:  129/79  Pulse: 83  Resp: 12  Weight: 238 lb (108 kg)  Height: 5\' 4"  (1.626 m)   Body mass index is 40.85 kg/m. Gen:  WD/WN, NAD Head: [place]/AT, No temporalis wasting.  Ear/Nose/Throat: Hearing grossly intact, nares w/o erythema or drainage, trachea midline Eyes: Conjunctiva clear. Sclera non-icteric Neck: Supple.  Soft bilateral carotid bruit  Pulmonary:  Good air movement, equal and clear to auscultation bilaterally.  Cardiac: RRR, No JVD Vascular:  Vessel Right Left  Radial Palpable Palpable           Musculoskeletal: M/S 5/5 throughout.  No deformity or atrophy.  Trace lower extremity edema. Neurologic: CN 2-12 intact. Sensation grossly intact in extremities.  Symmetrical.  Speech is fluent. Motor exam as listed above. Psychiatric: Judgment intact, Mood & affect appropriate for pt's clinical situation. Dermatologic: No rashes or ulcers noted.  No cellulitis or open wounds.      CBC Lab Results  Component Value Date   WBC 8.1 01/11/2020   HGB 15.5 01/11/2020   HCT 45.4 01/11/2020   MCV 91 01/11/2020   PLT 261 01/11/2020    BMET    Component Value Date/Time   NA 142 01/11/2020 1054   K 3.9 01/11/2020 1054   CL 101 01/11/2020 1054   CO2 25 01/11/2020 1054   GLUCOSE 136 (H) 01/11/2020 1054   BUN 15 01/11/2020 1054   CREATININE 0.87 01/11/2020 1054   CALCIUM 10.6 (H) 01/11/2020 1054   GFRNONAA 69 01/11/2020 1054   GFRAA 80 01/11/2020 1054   Estimated Creatinine Clearance: 75.3 mL/min (by C-G formula based on SCr of 0.87 mg/dL).  COAG No results found for: INR, PROTIME  Radiology No results found.    Assessment/Plan Benign hypertension blood pressure control important in reducing the progression of atherosclerotic disease. On appropriate oral medications.   Prediabetes blood glucose control important in reducing the progression of atherosclerotic disease. Also, involved in wound healing. On appropriate medications.   Hyperlipemia lipid control  important in reducing the progression of atherosclerotic disease. Continue statin therapy   Bilateral carotid bruits The patient has asymptomatic carotid bruits.  The patient has no current symptoms, but we discussed at length the carotid disease is likely asymptomatic until it causes a stroke.  I have recommended a carotid duplex to be performed in the near future at her convenience.  I discussed the pathophysiology and natural history of carotid disease and why it is important in stroke risk reduction.  The patient is on aspirin and a statin agent she should continue these.  We will see her back following her carotid duplex to discuss the results and determine further treatment options.    Leotis Pain, MD  01/18/2020 10:24 AM    This note was created with Dragon medical transcription system.  Any errors from dictation are purely unintentional

## 2020-01-18 NOTE — Assessment & Plan Note (Signed)
blood glucose control important in reducing the progression of atherosclerotic disease. Also, involved in wound healing. On appropriate medications.  

## 2020-01-18 NOTE — Assessment & Plan Note (Signed)
The patient has asymptomatic carotid bruits.  The patient has no current symptoms, but we discussed at length the carotid disease is likely asymptomatic until it causes a stroke.  I have recommended a carotid duplex to be performed in the near future at her convenience.  I discussed the pathophysiology and natural history of carotid disease and why it is important in stroke risk reduction.  The patient is on aspirin and a statin agent she should continue these.  We will see her back following her carotid duplex to discuss the results and determine further treatment options.

## 2020-01-18 NOTE — Patient Instructions (Signed)
Carotid Artery Disease  Carotid artery disease is the narrowing or blockage of one or both carotid arteries. This condition is also called carotid artery stenosis. The carotid arteries are the two main blood vessels on either side of the neck. They send blood to the brain, other parts of the head, and the neck.  This condition increases your risk for a stroke or a transient ischemic attack (TIA). A TIA is a "mini-stroke" that causes stroke-like symptoms that go away quickly. What are the causes? This condition is mainly caused by a narrowing and hardening of the carotid arteries. The carotid arteries can become narrow or clogged with a buildup of plaque. Plaque includes:  Fat.  Cholesterol.  Calcium.  Other substances. What increases the risk? The following factors may make you more likely to develop this condition:  Having certain medical conditions, such as: ? High cholesterol. ? High blood pressure. ? Diabetes. ? Obesity.  Smoking.  A family history of cardiovascular disease.  Not being active or lack of regular exercise.  Being female. Men have a higher risk of having arteries become narrow and harden earlier in life than women.  Old age. What are the signs or symptoms? This condition may not have any signs or symptoms until a stroke or TIA happens. In some cases, your doctor may be able to hear a whooshing sound. This can suggest a change in blood flow caused by plaque buildup. An eye exam can also help find signs of the condition. How is this treated? This condition may be treated with more than one treatment. Treatment options include:  Lifestyle changes, such as: ? Quitting smoking. ? Getting regular exercise, or getting exercise as told by your doctor. ? Eating a healthy diet. ? Managing stress. ? Keeping a healthy weight.  Medicines to control: ? Blood pressure. ? Cholesterol. ? Blood clotting.  Surgery. You may have: ? A surgery to remove the blockages in  the carotid arteries. ? A procedure in which a small mesh tube (stent) is used to widen the blocked carotid arteries. Follow these instructions at home: Eating and drinking Follow instructions about your diet from your doctor. It is important to follow a healthy diet.  Eat a diet that includes: ? A lot of fresh fruits and vegetables. ? Low-fat (lean) meats.  Avoid these foods: ? Foods that are high in fat. ? Foods that are high in salt (sodium). ? Foods that are fried. ? Foods that are processed. ? Foods that have few good nutrients (poor nutritional value).  Lifestyle   Keep a healthy weight.  Do exercises as told by your doctor to stay active. Each week, you should get one of the following: ? At least 150 minutes of exercise that raises your heart rate and makes you sweat (moderate-intensity exercise). ? At least 75 minutes of exercise that takes a lot of effort.  Do not use any products that contain nicotine or tobacco, such as cigarettes, e-cigarettes, and chewing tobacco. If you need help quitting, ask your doctor.  Do not drink alcohol if: ? Your doctor tells you not to drink. ? You are pregnant, may be pregnant, or are planning to become pregnant.  If you drink alcohol: ? Limit how much you use to:  0-1 drink a day for women.  0-2 drinks a day for men. ? Be aware of how much alcohol is in your drink. In the [place] one drink equals one 12 oz bottle of beer (355 mL), one 5   oz glass of wine (148 mL), or one 1 oz glass of hard liquor (44 mL).  Do not use drugs.  Manage your stress. Ask your doctor for tips on how to do this. General instructions  Take over-the-counter and prescription medicines only as told by your doctor.  Keep all follow-up visits as told by your doctor. This is important. Where to find more information  American Heart Association: www.heart.org Get help right away if:  You have any signs of a stroke. "BE FAST" is an easy way to remember the  main warning signs: ? B - Balance. Signs are dizziness, sudden trouble walking, or loss of balance. ? E - Eyes. Signs are trouble seeing or a change in how you see. ? F - Face. Signs are sudden weakness or loss of feeling of the face, or the face or eyelid drooping on one side. ? A - Arms. Signs are weakness or loss of feeling in an arm. This happens suddenly and usually on one side of the body. ? S - Speech. Signs are sudden trouble speaking, slurred speech, or trouble understanding what people say. ? T - Time. Time to call emergency services. Write down what time symptoms started.  You have other signs of a stroke, such as: ? A sudden, very bad headache with no known cause. ? Feeling like you may vomit (nausea). ? Vomiting. ? A seizure. These symptoms may be an emergency. Do not wait to see if the symptoms will go away. Get medical help right away. Call your local emergency services (911 in the [place] Do not drive yourself to the hospital. Summary  The carotid arteries are blood vessels on both sides of the neck.  If these arteries get smaller or get blocked, you are more likely to have a stroke or a mini-stroke.  This condition can be treated with lifestyle changes, medicines, surgery, or a blend of these treatments.  Get help right away if you have any signs of a stroke. "BE FAST" is an easy way to remember the main warning signs of stroke. This information is not intended to replace advice given to you by your health care provider. Make sure you discuss any questions you have with your health care provider. Document Revised: 07/05/2019 Document Reviewed: 07/05/2019 Elsevier Patient Education  2020 [employer]  

## 2020-01-18 NOTE — Assessment & Plan Note (Signed)
blood pressure control important in reducing the progression of atherosclerotic disease. On appropriate oral medications.  

## 2020-01-18 NOTE — Assessment & Plan Note (Signed)
lipid control important in reducing the progression of atherosclerotic disease. Continue statin therapy  

## 2020-01-21 ENCOUNTER — Ambulatory Visit (INDEPENDENT_AMBULATORY_CARE_PROVIDER_SITE_OTHER): Payer: Medicare PPO | Admitting: Nurse Practitioner

## 2020-01-21 ENCOUNTER — Ambulatory Visit (INDEPENDENT_AMBULATORY_CARE_PROVIDER_SITE_OTHER): Payer: Medicare PPO

## 2020-01-21 ENCOUNTER — Other Ambulatory Visit: Payer: Self-pay

## 2020-01-21 ENCOUNTER — Encounter (INDEPENDENT_AMBULATORY_CARE_PROVIDER_SITE_OTHER): Payer: Self-pay | Admitting: Nurse Practitioner

## 2020-01-21 VITALS — BP 139/82 | HR 97 | Resp 16 | Wt 240.0 lb

## 2020-01-21 DIAGNOSIS — R0989 Other specified symptoms and signs involving the circulatory and respiratory systems: Secondary | ICD-10-CM

## 2020-01-21 DIAGNOSIS — E785 Hyperlipidemia, unspecified: Secondary | ICD-10-CM

## 2020-01-21 DIAGNOSIS — I10 Essential (primary) hypertension: Secondary | ICD-10-CM | POA: Diagnosis not present

## 2020-01-24 ENCOUNTER — Encounter (INDEPENDENT_AMBULATORY_CARE_PROVIDER_SITE_OTHER): Payer: Self-pay | Admitting: Nurse Practitioner

## 2020-01-24 NOTE — Progress Notes (Signed)
SUBJECTIVE:  Patient ID: Barbara Fitzpatrick, female    DOB: 01-19-1951, 69 y.o.   MRN: [id number] Chief Complaint  Patient presents with  . Follow-up    ultrasound folow up    HPI  Barbara Fitzpatrick is a 69 y.o. female that presents today after referral by her PCP, Marnee Guarneri, NP.  The patient was found to have bilateral carotid bruits.  These were previously not noted.  The patient denies any symptoms of symptomatic carotid stenosis such as amaurosis fugax, TIA or strokelike symptoms, dizziness or any other focal neurological symptoms.  Today the bilateral internal carotid arteries display no evidence of stenosis.  The bilateral vertebral arteries demonstrate antegrade flow.  There are normal flow hemodynamics seen within the bilateral subclavian arteries.  Past Medical History:  Diagnosis Date  . Arthritis   . Depression   . GERD (gastroesophageal reflux disease)   . Hyperlipidemia   . Hypertension   . Personal history of colonic adenomas 06/16/2013   4 adenomas (max 1 cm)  04/2003  no polyps 04/2005   . Sleep apnea    uses c-pap    Past Surgical History:  Procedure Laterality Date  . COLONOSCOPY    . hysterectomy     with bladder tack  . TUBAL LIGATION      Social History   Socioeconomic History  . Marital status: Single    Spouse name: Not on file  . Number of children: Not on file  . Years of education: Not on file  . Highest education level: 12th grade  Occupational History  . Occupation: retired  Tobacco Use  . Smoking status: Never Smoker  . Smokeless tobacco: Never Used  Substance and Sexual Activity  . Alcohol use: No  . Drug use: No  . Sexual activity: Never  Other Topics Concern  . Not on file  Social History Narrative  . Not on file   Social Determinants of Health   Financial Resource Strain:   . Difficulty of Paying Living Expenses: Not on file  Food Insecurity:   . Worried About Charity fundraiser in the Last Year: Not on file  . Ran Out  of Food in the Last Year: Not on file  Transportation Needs:   . Lack of Transportation (Medical): Not on file  . Lack of Transportation (Non-Medical): Not on file  Physical Activity: Insufficiently Active  . Days of Exercise per Week: 3 days  . Minutes of Exercise per Session: 20 min  Stress:   . Feeling of Stress : Not on file  Social Connections:   . Frequency of Communication with Friends and Family: Not on file  . Frequency of Social Gatherings with Friends and Family: Not on file  . Attends Religious Services: Not on file  . Active Member of Clubs or Organizations: Not on file  . Attends Archivist Meetings: Not on file  . Marital Status: Not on file  Intimate Partner Violence:   . Fear of Current or Ex-Partner: Not on file  . Emotionally Abused: Not on file  . Physically Abused: Not on file  . Sexually Abused: Not on file    Family History  Problem Relation Age of Onset  . Hypertension Mother   . Hyperlipidemia Mother   . Diabetes Mother   . Cancer Mother        colon (benign)  . Glaucoma Mother   . Heart disease Father   . Hypertension Father   . Hyperlipidemia  Father   . Diabetes Father   . Diabetes Sister   . Lung disease Sister        polyps  . Cancer Brother        kidney  . Glaucoma Brother   . Hyperlipidemia Brother   . Hypertension Brother   . Stroke Maternal Grandfather   . Diabetes Paternal Grandmother   . Heart disease Paternal Grandfather   . Cancer Maternal Aunt 50       ovarian  . Colon cancer Neg Hx   . Esophageal cancer Neg Hx   . Rectal cancer Neg Hx   . Stomach cancer Neg Hx     No Known Allergies   Review of Systems   Review of Systems: Negative Unless Checked Constitutional: [] Weight loss  [] Fever  [] Chills Cardiac: [] Chest pain   []  Atrial Fibrillation  [] Palpitations   [] Shortness of breath when laying flat   [] Shortness of breath with exertion. [] Shortness of breath at rest Vascular:  [] Pain in legs with walking    [] Pain in legs with standing [] Pain in legs when laying flat   [] Claudication    [] Pain in feet when laying flat    [] History of DVT   [] Phlebitis   [] Swelling in legs   [] Varicose veins   [] Non-healing ulcers Pulmonary:   [] Uses home oxygen   [] Productive cough   [] Hemoptysis   [] Wheeze  [] COPD   [] Asthma Neurologic:  [] Dizziness   [] Seizures  [] Blackouts [] History of stroke   [] History of TIA  [] Aphasia   [] Temporary Blindness   [] Weakness or numbness in arm   [] Weakness or numbness in leg Musculoskeletal:   [] Joint swelling   [] Joint pain   [] Low back pain  []  History of Knee Replacement [] Arthritis [] back Surgeries  []  Spinal Stenosis    Hematologic:  [] Easy bruising  [] Easy bleeding   [] Hypercoagulable state   [] Anemic Gastrointestinal:  [] Diarrhea   [] Vomiting  [x] Gastroesophageal reflux/heartburn   [] Difficulty swallowing. [] Abdominal pain Genitourinary:  [] Chronic kidney disease   [] Difficult urination  [] Anuric   [] Blood in urine [] Frequent urination  [] Burning with urination   [] Hematuria Skin:  [] Rashes   [] Ulcers [] Wounds Psychological:  [] History of anxiety   [x]  History of major depression  []  Memory Difficulties      OBJECTIVE:   Physical Exam  BP 139/82 (BP Location: Right Arm)   Pulse 97   Resp 16   Wt 240 lb (108.9 kg)   LMP  (LMP Unknown)   BMI 41.20 kg/m   Gen: WD/WN, NAD Head: [place]/AT, No temporalis wasting.  Ear/Nose/Throat: Hearing grossly intact, nares w/o erythema or drainage Eyes: PER, EOMI, sclera nonicteric.  Neck: Supple, no masses.  No JVD.  Pulmonary:  Good air movement, no use of accessory muscles.  Cardiac: RRR Vascular:  Soft bilateral bruit Vessel Right Left  Radial Palpable Palpable   Gastrointestinal: soft, non-distended. No guarding/no peritoneal signs.  Musculoskeletal: M/S 5/5 throughout.  No deformity or atrophy.  Neurologic: Pain and light touch intact in extremities.  Symmetrical.  Speech is fluent. Motor exam as listed above. Psychiatric:  Judgment intact, Mood & affect appropriate for pt's clinical situation. Dermatologic: No Venous rashes. No Ulcers Noted.  No changes consistent with cellulitis.       ASSESSMENT AND PLAN:  1. Bilateral carotid bruits Today the bilateral internal carotid arteries display no evidence of stenosis.  The bilateral vertebral arteries demonstrate antegrade flow.  There are normal flow hemodynamics seen within the bilateral subclavian arteries.  Currently  the patient has no evidence of carotid artery stenosis.  Based on this we will have the patient follow-up on an as-needed basis.  If carotid bruit should worsen or if she develops signs of possible symptomatic carotid disease, patient should contact her office for evaluation. 2. Benign hypertension Maintaining good blood pressure control is essential for slowing atherosclerotic disease .  Patient has appropriate medications.  No changes made today.  3. Hyperlipidemia, unspecified hyperlipidemia type Good lipid control is also essential for slowly atherosclerotic disease.  Patient on appropriate medications.  No changes made today.  Current Outpatient Medications on File Prior to Visit  Medication Sig Dispense Refill  . acyclovir ointment (ZOVIRAX) 5 % Apply 1 application topically 3 (three) times daily as needed. 15 g 1  . aspirin 81 MG tablet Take 81 mg by mouth daily.    Marland Kitchen atorvastatin (LIPITOR) 40 MG tablet Take 1 tablet (40 mg total) by mouth daily. 90 tablet 3  . buPROPion (WELLBUTRIN XL) 300 MG 24 hr tablet TAKE 1 TABLET(300 MG) BY MOUTH DAILY 90 tablet 3  . cetirizine (ZYRTEC) 10 MG tablet Take 10 mg by mouth daily.    . Cholecalciferol (VITAMIN D-3 PO) Take 3,000 Units by mouth daily.     . Coenzyme Q10 (CO Q-10 PO) Take by mouth daily.    . cyclobenzaprine (FLEXERIL) 10 MG tablet Take 1 tablet (10 mg total) by mouth 3 (three) times daily as needed for muscle spasms. 30 tablet 0  . EPIPEN 2-PAK 0.3 MG/0.3ML SOAJ injection as needed.      Marland Kitchen glucosamine-chondroitin 500-400 MG tablet Take 1 tablet by mouth 2 (two) times daily.    . hydrochlorothiazide (HYDRODIURIL) 25 MG tablet Take 1 tablet (25 mg total) by mouth daily. 90 tablet 2  . Magnesium 250 MG TABS Take by mouth daily.     . Methylcellulose, Laxative, (FIBER THERAPY PO) Take 1 tablet by mouth daily.    . Multiple Vitamins-Minerals (MULTIVITAMIN PO) Take by mouth daily.    Marland Kitchen nystatin (NYSTATIN) powder Apply topically 4 (four) times daily. 60 g 1  . nystatin cream (MYCOSTATIN) Apply 1 application topically 2 (two) times daily. 60 g 1  . Olopatadine HCl 0.6 % SOLN U 2 SPRAYS IEN BID    . omeprazole (PRILOSEC) 20 MG capsule Take 20 mg by mouth daily.    . pramipexole (MIRAPEX) 0.5 MG tablet Take 1 tablet (0.5 mg total) by mouth at bedtime as needed. 90 tablet 1  . Probiotic Product (PROBIOTIC COLON SUPPORT PO) Take by mouth. Take one pill every other day    . quinapril (ACCUPRIL) 40 MG tablet Take 1 tablet (40 mg total) by mouth at bedtime. 90 tablet 1  . valACYclovir (VALTREX) 1000 MG tablet Take one tablet twice daily at the first sign of a flare for about 3-5 days 20 tablet 6  . vitamin B-12 (CYANOCOBALAMIN) 1000 MCG tablet Take 1,000 mcg by mouth daily.    . vitamin E (VITAMIN E) 1000 UNIT capsule Take 1,000 Units by mouth daily.    Marland Kitchen aluminum chloride (DRYSOL) 20 % external solution Apply topically at bedtime. (Patient not taking: Reported on 01/18/2020) 60 mL 1   No current facility-administered medications on file prior to visit.    There are no Patient Instructions on file for this visit. No follow-ups on file.   Kris Hartmann, NP  This note was completed with Sales executive.  Any errors are purely unintentional.

## 2020-02-16 ENCOUNTER — Ambulatory Visit
Admission: RE | Admit: 2020-02-16 | Discharge: 2020-02-16 | Disposition: A | Discharge status: Home / Self Care | Payer: Medicare PPO | Source: Ambulatory Visit | Attending: Nurse Practitioner | Admitting: Nurse Practitioner

## 2020-02-16 DIAGNOSIS — Z1231 Encounter for screening mammogram for malignant neoplasm of breast: Secondary | ICD-10-CM | POA: Diagnosis present

## 2020-02-16 NOTE — Progress Notes (Signed)
Contacted via MyChart

## 2020-03-16 ENCOUNTER — Other Ambulatory Visit: Payer: Medicare PPO

## 2020-03-16 ENCOUNTER — Encounter: Payer: Self-pay | Admitting: Nurse Practitioner

## 2020-03-16 ENCOUNTER — Ambulatory Visit (INDEPENDENT_AMBULATORY_CARE_PROVIDER_SITE_OTHER): Payer: Medicare PPO | Admitting: Nurse Practitioner

## 2020-03-16 ENCOUNTER — Other Ambulatory Visit: Payer: Self-pay

## 2020-03-16 DIAGNOSIS — R0989 Other specified symptoms and signs involving the circulatory and respiratory systems: Secondary | ICD-10-CM

## 2020-03-16 DIAGNOSIS — E782 Mixed hyperlipidemia: Secondary | ICD-10-CM

## 2020-03-16 DIAGNOSIS — R7303 Prediabetes: Secondary | ICD-10-CM | POA: Diagnosis not present

## 2020-03-16 LAB — MICROALBUMIN, URINE WAIVED
Creatinine, Urine Waived: 50 mg/dL (ref 10–300)
Microalb, Ur Waived: 10 mg/L (ref 0–19)

## 2020-03-16 LAB — LIPID PANEL PICCOLO, WAIVED
Chol/HDL Ratio Piccolo,Waive: 4.4 mg/dL
Cholesterol Piccolo, Waived: 172 mg/dL (ref <200)
HDL Chol Piccolo, Waived: 39 mg/dL — ABNORMAL LOW (ref >59)
LDL Chol Calc Piccolo Waived: 89 mg/dL (ref <100)
Triglycerides Piccolo,Waived: 222 mg/dL — ABNORMAL HIGH (ref <150)
VLDL Chol Calc Piccolo,Waive: 44 mg/dL — ABNORMAL HIGH (ref <30)

## 2020-03-16 LAB — BAYER DCA HB A1C WAIVED: HB A1C (BAYER DCA - WAIVED): 6.4 % (ref <7.0)

## 2020-03-16 NOTE — Assessment & Plan Note (Signed)
New diagnosis, noted on recent annual physical.  She remains asymptomatic and recent imaging by vascular noted no stenosis.  Discussed at length preventative measures, including goal for LDL <70 and BP <130/80. She is to alert provider if any symptoms present.  Will plan on returning to vascular if symptoms present or worsening bruits noted.  At this time continue preventative measures, including ASA and statin.

## 2020-03-16 NOTE — Progress Notes (Signed)
BP 133/81   Pulse 76   Wt 243 lb (110.2 kg)   LMP  (LMP Unknown)   BMI 41.71 kg/m    Subjective:    Patient ID: Barbara Fitzpatrick, female    DOB: September 03, 1951, 69 y.o.   MRN: [id number]  HPI: Barbara Fitzpatrick is a 69 y.o. female  Chief Complaint  Patient presents with  . Hyperlipidemia    3 month f/up    . This visit was completed via telephone due to the restrictions of the COVID-19 pandemic. All issues as above were discussed and addressed but no physical exam was performed. If it was felt that the patient should be evaluated in the office, they were directed there. The patient verbally consented to this visit. Patient was unable to complete an audio/visual visit due to Lack of equipment. Due to the catastrophic nature of the COVID-19 pandemic, this visit was done through audio contact only. . Location of the patient: home . Location of the provider: home . Those involved with this call:  . Provider: Marnee Guarneri, DNP . CMA: Yvonna Alanis, CMA . Front Desk/Registration: Don Perking  . Time spent on call: 15 minutes on the phone discussing health concerns. 10 minutes total spent in review of patient's record and preparation of their chart.  . I verified patient identity using two factors (patient name and date of birth). Patient consents verbally to being seen via telemedicine visit today.    HYPERLIPIDEMIA Continues on Accupril 40 MG + HCTZ 25 MG for HTN & Lipitor 40 MG.  Does have CPAP and uses 100% of the time. January 2021 labs noted LDL 109 and TCHOL 184.  Did have carotid bruits noted at annual physical and saw vascular with no stenosis noted on imaging and she is to follow-up as needed with them if symptomatic or worsening bruits. Hyperlipidemia status: good compliance Satisfied with current treatment?  yes Side effects:  no Medication compliance: good compliance Past cholesterol meds: Atorvastatin Supplements: none Aspirin:  yes The 10-year ASCVD risk score  Mikey Bussing DC Jr., et al., 2013) is: 19.6%   Values used to calculate the score:     Age: 68 years     Sex: Female     Is Non-Hispanic African American: No     Diabetic: Yes     Tobacco smoker: No     Systolic Blood Pressure: [id number] mmHg     Is BP treated: Yes     HDL Cholesterol: 39 mg/dL     Total Cholesterol: 184 mg/dL Chest pain:  no Coronary artery disease:  no Family history CAD:  no Family history early CAD:  no   DIABETES A1C last visit in January was 6.3%.  She has been focused on diet, but is having difficulty with changes. Polydipsia/polyuria: no Visual disturbance: no Chest pain: no Paresthesias: no  Relevant past medical, surgical, family and social history reviewed and updated as indicated. Interim medical history since our last visit reviewed. Allergies and medications reviewed and updated.  Review of Systems  Constitutional: Negative for activity change, appetite change, diaphoresis, fatigue and fever.  Respiratory: Negative for cough, chest tightness and shortness of breath.   Cardiovascular: Negative for chest pain, palpitations and leg swelling.  Gastrointestinal: Negative.   Endocrine: Negative for polydipsia, polyphagia and polyuria.  Neurological: Negative.   Psychiatric/Behavioral: Negative.     Per HPI unless specifically indicated above     Objective:    BP 133/81   Pulse 76   Wt  243 lb (110.2 kg)   LMP  (LMP Unknown)   BMI 41.71 kg/m   Wt Readings from Last 3 Encounters:  03/16/20 243 lb (110.2 kg)  01/21/20 240 lb (108.9 kg)  01/18/20 238 lb (108 kg)    Physical Exam   Unable to perform due to telephone visit only  Results for orders placed or performed in visit on 01/11/20  CBC with Differential/Platelet  Result Value Ref Range   WBC 8.1 3.4 - 10.8 x10E3/uL   RBC 4.97 3.77 - 5.28 x10E6/uL   Hemoglobin 15.5 11.1 - 15.9 g/dL   Hematocrit 45.4 34.0 - 46.6 %   MCV 91 79 - 97 fL   MCH 31.2 26.6 - 33.0 pg   MCHC 34.1 31.5 - 35.7 g/dL    RDW 12.8 11.7 - 15.4 %   Platelets 261 150 - 450 x10E3/uL   Neutrophils 65 Not Estab. %   Lymphs 26 Not Estab. %   Monocytes 7 Not Estab. %   Eos 1 Not Estab. %   Basos 1 Not Estab. %   Neutrophils Absolute 5.3 1.4 - 7.0 x10E3/uL   Lymphocytes Absolute 2.2 0.7 - 3.1 x10E3/uL   Monocytes Absolute 0.6 0.1 - 0.9 x10E3/uL   EOS (ABSOLUTE) 0.1 0.0 - 0.4 x10E3/uL   Basophils Absolute 0.0 0.0 - 0.2 x10E3/uL   Immature Granulocytes 0 Not Estab. %   Immature Grans (Abs) 0.0 0.0 - 0.1 x10E3/uL  Comprehensive metabolic panel  Result Value Ref Range   Glucose 136 (H) 65 - 99 mg/dL   BUN 15 8 - 27 mg/dL   Creatinine, Ser 0.87 0.57 - 1.00 mg/dL   GFR calc non Af Amer 69 >59 mL/min/1.73   GFR calc Af Amer 80 >59 mL/min/1.73   BUN/Creatinine Ratio 17 12 - 28   Sodium 142 134 - 144 mmol/L   Potassium 3.9 3.5 - 5.2 mmol/L   Chloride 101 96 - 106 mmol/L   CO2 25 20 - 29 mmol/L   Calcium 10.6 (H) 8.7 - 10.3 mg/dL   Total Protein 6.6 6.0 - 8.5 g/dL   Albumin 4.1 3.8 - 4.8 g/dL   Globulin, Total 2.5 1.5 - 4.5 g/dL   Albumin/Globulin Ratio 1.6 1.2 - 2.2   Bilirubin Total 0.4 0.0 - 1.2 mg/dL   Alkaline Phosphatase 103 39 - 117 IU/L   AST 23 0 - 40 IU/L   ALT 34 (H) 0 - 32 IU/L  TSH  Result Value Ref Range   TSH 2.150 0.450 - 4.500 uIU/mL  Lipid Panel w/o Chol/HDL Ratio  Result Value Ref Range   Cholesterol, Total 184 100 - 199 mg/dL   Triglycerides 209 (H) 0 - 149 mg/dL   HDL 39 (L) >39 mg/dL   VLDL Cholesterol Cal 36 5 - 40 mg/dL   LDL Chol Calc (NIH) 109 (H) 0 - 99 mg/dL  VITAMIN D 25 Hydroxy (Vit-D Deficiency, Fractures)  Result Value Ref Range   Vit D, 25-Hydroxy 30.5 30.0 - 100.0 ng/mL  Vitamin B12  Result Value Ref Range   Vitamin B-12 1,507 (H) 232 - 1,245 pg/mL  Magnesium  Result Value Ref Range   Magnesium 2.0 1.6 - 2.3 mg/dL  Microalbumin, Urine Waived  Result Value Ref Range   Microalb, Ur Waived 30 (H) 0 - 19 mg/L   Creatinine, Urine Waived 50 10 - 300 mg/dL    Microalb/Creat Ratio 30-300 (H) <30 mg/g  Hgb A1c w/o eAG  Result Value Ref Range   Hgb  A1c MFr Bld 6.3 (H) 4.8 - 5.6 %  Specimen status report  Result Value Ref Range   specimen status report Comment       Assessment & Plan:   Problem List Items Addressed This Visit      Other   Prediabetes    Noted on recent labs.  Will recheck this outpatient.  Recommend heavy focus on diet and exercise regimen to prevent from entering Type 2 Diabetes levels of 6.5% or greater.  Recent level 6.3%.        Relevant Orders   Bayer DCA Hb A1c Waived   Microalbumin, Urine Waived   Hyperlipemia    Chronic, ongoing with last levels slightly above goal.  Will continue Atorvastatin 40 MG QHS at this time and adjust as needed based on outpatient labs.  Discussed with her possibly increasing to 80 MG daily, which she is agreeable to if levels remain above goal.  Obtain outpatient labs.      Relevant Orders   Lipid Panel Piccolo, Waived   Bilateral carotid bruits    New diagnosis, noted on recent annual physical.  She remains asymptomatic and recent imaging by vascular noted no stenosis.  Discussed at length preventative measures, including goal for LDL <70 and BP <130/80. She is to alert provider if any symptoms present.  Will plan on returning to vascular if symptoms present or worsening bruits noted.  At this time continue preventative measures, including ASA and statin.      Relevant Orders   Bayer DCA Hb A1c Waived   Lipid Panel Piccolo, [place]      I discussed the assessment and treatment plan with the patient. The patient was provided an opportunity to ask questions and all were answered. The patient agreed with the plan and demonstrated an understanding of the instructions.   The patient was advised to call back or seek an in-person evaluation if the symptoms worsen or if the condition fails to improve as anticipated.   I provided 15+ minutes of time during this encounter.  Follow up  plan: Return in about 6 months (around 09/16/2020) for Prediabetes, HTN/HLD, Mood.

## 2020-03-16 NOTE — Assessment & Plan Note (Signed)
Noted on recent labs.  Will recheck this outpatient.  Recommend heavy focus on diet and exercise regimen to prevent from entering Type 2 Diabetes levels of 6.5% or greater.  Recent level 6.3%.

## 2020-03-16 NOTE — Patient Instructions (Signed)
Carotid Artery Disease  Carotid artery disease, also called carotid artery stenosis, is the narrowing or blockage of one or both carotid arteries. The carotid arteries are the two main blood vessels on either side of the neck. They supply blood to the brain, other parts of the head, and the neck. Carotid artery disease increases your risk for a stroke or a transient ischemic attack (TIA). A TIA is a "mini-stroke" that causes stroke-like symptoms that then go away quickly. What are the causes? This condition is mainly caused by a narrowing and hardening of the carotid arteries (atherosclerosis). The carotid arteries can become narrow or clogged with a buildup of fat, cholesterol, calcium, and other substances (plaque). What increases the risk? The following factors may make you more likely to develop this condition:  Having certain medical conditions, such as: ? High cholesterol. ? High blood pressure (hypertension). ? Diabetes. ? Obesity.  Smoking.  A family history of cardiovascular disease.  Inactivity or lack of regular exercise.  Being female. Men have an increased risk of developing atherosclerosis earlier in life than women.  Old age. What are the signs or symptoms? This condition may not have any signs or symptoms until a stroke or TIA occurs. In some cases, your health care provider may be able to hear a whooshing sound (bruit). This can indicate a change in blood flow caused by plaque buildup. An eye exam can also help identify signs of the condition. How is this diagnosed? This condition may be diagnosed with a physical exam, your medical history, and your family's medical history. You may also have tests that look at the blood flow in your carotid arteries, such as:  Carotid artery ultrasound, which uses sound waves to create pictures to show if the arteries are narrow or blocked.  Tests that use a dye injected into a vein to highlight your arteries on images, such  as: ? Carotid or cerebral angiography, which uses X-rays. ? Computerized tomographic angiography (CTA), which uses CT scans. ? Magnetic resonance angiography (MRA), which uses MRI. How is this treated? This condition may be treated with a combination of treatments. Treatment options include:  Lifestyle changes, such as: ? Quitting smoking. ? Exercising regularly or as told by your health care provider. ? Eating a heart-healthy diet. ? Managing stress. ? Maintaining a healthy weight.  Medicines to control blood pressure, cholesterol, and blood clotting.  Surgery. You may have: ? A carotid endarterectomy. This is a surgery to remove the blockages in the carotid arteries. ? A carotid angioplasty with stenting. This is a procedure in which a small mesh tube (stent) is used to widen the blocked carotid arteries. Follow these instructions at home: Eating and drinking Follow instructions about your diet from your health care provider. It is important to:  Eat a healthy diet that is low in saturated fats and includes plenty of fresh fruits, vegetables, and lean meats.  Avoid foods that are high in fat and salt (sodium).  Avoid foods that are fried, overly processed, or have poor nutritional value.  Lifestyle   Maintain a healthy weight.  Do exercises as told by your health care provider to stay physically active. It is recommended that each week you get at least 150 minutes of moderate-intensity exercise or 75 minutes of exercise that takes a lot of effort.  Do not use any products that contain nicotine or tobacco, such as cigarettes, e-cigarettes, and chewing tobacco. If you need help quitting, ask your health care provider.    Do not drink alcohol if: ? Your health care provider tells you not to drink. ? You are pregnant, may be pregnant, or are planning to become pregnant.  If you drink alcohol: ? Limit how much you use to:  0-1 drink a day for women.  0-2 drinks a day for  men. ? Be aware of how much alcohol is in your drink. In the [place] one drink equals one 12 oz bottle of beer (355 mL), one 5 oz glass of wine (148 mL), or one 1 oz glass of hard liquor (44 mL).  Do not use drugs.  Manage your stress. Ask your health care provider for stress management tips. General instructions  Take over-the-counter and prescription medicines only as told by your health care provider.  Keep all follow-up visits as told by your health care provider. This is important. Where to find more information  American Heart Association: www.heart.org Get help right away if:  You have any symptoms of a stroke. "BE FAST" is an easy way to remember the main warning signs of a stroke: ? B - Balance. Signs are dizziness, sudden trouble walking, or loss of balance. ? E - Eyes. Signs are trouble seeing or a sudden change in vision. ? F - Face. Signs are sudden weakness or numbness of the face, or the face or eyelid drooping on one side. ? A - Arms. Signs are weakness or numbness in an arm. This happens suddenly and usually on one side of the body. ? S - Speech. Signs are sudden trouble speaking, slurred speech, or trouble understanding what people say. ? T - Time. Time to call emergency services. Write down what time symptoms started.  You have other signs of a stroke, such as: ? A sudden, severe headache with no known cause. ? Nausea or vomiting. ? Seizure. These symptoms may represent a serious problem that is an emergency. Do not wait to see if the symptoms will go away. Get medical help right away. Call your local emergency services (911 in the [place] Do not drive yourself to the hospital. Summary  Carotid artery disease, also called carotid artery stenosis, is the narrowing or blockage of one or both carotid arteries.  Carotid artery disease increases your risk for a stroke or a transient ischemic attack (TIA).  This condition can be treated with lifestyle changes, medicines,  surgery, or a combination of these treatments.  Get help right away if you have any symptoms of stroke. The acronym BEFAST is an easy way to remember the main warning signs of stroke. This information is not intended to replace advice given to you by your health care provider. Make sure you discuss any questions you have with your health care provider. Document Revised: 06/21/2019 Document Reviewed: 06/21/2019 Elsevier Patient Education  2020 Elsevier Inc.  

## 2020-03-16 NOTE — Assessment & Plan Note (Signed)
Chronic, ongoing with last levels slightly above goal.  Will continue Atorvastatin 40 MG QHS at this time and adjust as needed based on outpatient labs.  Discussed with her possibly increasing to 80 MG daily, which she is agreeable to if levels remain above goal.  Obtain outpatient labs.

## 2020-03-17 NOTE — Progress Notes (Signed)
Contacted via MyChart

## 2020-03-22 ENCOUNTER — Encounter: Payer: Self-pay | Admitting: Nurse Practitioner

## 2020-04-09 ENCOUNTER — Other Ambulatory Visit: Payer: Self-pay | Admitting: Nurse Practitioner

## 2020-04-09 NOTE — Telephone Encounter (Signed)
Requested Prescriptions  Pending Prescriptions Disp Refills  . quinapril (ACCUPRIL) 40 MG tablet [Pharmacy Med Name: QUINAPRIL 40MG  TABLETS] 90 tablet 1    Sig: TAKE 1 TABLET(40 MG) BY MOUTH AT BEDTIME     Cardiovascular:  ACE Inhibitors Passed - 04/09/2020  7:54 AM      Passed - Cr in normal range and within 180 days    Creatinine, Ser  Date Value Ref Range Status  01/11/2020 0.87 0.57 - 1.00 mg/dL Final         Passed - K in normal range and within 180 days    Potassium  Date Value Ref Range Status  01/11/2020 3.9 3.5 - 5.2 mmol/L Final         Passed - Patient is not pregnant      Passed - Last BP in normal range    BP Readings from Last 1 Encounters:  03/16/20 133/81         Passed - Valid encounter within last 6 months    Recent Outpatient Visits          3 weeks ago Mixed hyperlipidemia   [place], Barbaraann Faster, NP   2 months ago Annual physical exam   [place], [place] T, NP   10 months ago Depression, major, single episode, severe ([place])   [place], Barbaraann Faster, NP   1 year ago Annual physical exam   [place], Henrine Screws T, NP   1 year ago Depression, major, single episode, severe [facility])   [place], Lilia Argue, PA-C      Future Appointments            In 5 months Cannady, Barbaraann Faster, NP [employer]

## 2020-05-04 ENCOUNTER — Other Ambulatory Visit: Payer: Medicare PPO

## 2020-05-04 ENCOUNTER — Other Ambulatory Visit: Payer: Self-pay

## 2020-05-04 ENCOUNTER — Other Ambulatory Visit: Payer: Self-pay | Admitting: Nurse Practitioner

## 2020-05-04 ENCOUNTER — Telehealth: Payer: Self-pay | Admitting: Nurse Practitioner

## 2020-05-04 DIAGNOSIS — E785 Hyperlipidemia, unspecified: Secondary | ICD-10-CM

## 2020-05-04 MED ORDER — ATORVASTATIN CALCIUM 80 MG PO TABS: 80.0000 mg | ORAL_TABLET | Freq: Every day | ORAL | 3 refills | Status: DC

## 2020-05-04 NOTE — Telephone Encounter (Signed)
Pt stated she needs refills on the Atorvastatin medication 40 MG.Please advise.

## 2020-05-04 NOTE — Telephone Encounter (Signed)
Patient has been taking 2 of the 40 mg tablets as instructed from previous labs. Can we send in a new script reflecting that as patient is out?

## 2020-05-04 NOTE — Telephone Encounter (Signed)
I sent this new prescription in with dose change to 80 MG total.  Thank you.:)

## 2020-05-05 LAB — LIPID PANEL
Chol/HDL Ratio: 4.8 ratio — ABNORMAL HIGH (ref 0.0–4.4)
Cholesterol, Total: 155 mg/dL (ref 100–199)
HDL: 32 mg/dL — ABNORMAL LOW (ref >39)
LDL Chol Calc (NIH): 81 mg/dL (ref 0–99)
Triglycerides: 254 mg/dL — ABNORMAL HIGH (ref 0–149)
VLDL Cholesterol Cal: 42 mg/dL — ABNORMAL HIGH (ref 5–40)

## 2020-05-05 NOTE — Progress Notes (Signed)
Contacted via MyChart

## 2020-07-11 ENCOUNTER — Ambulatory Visit: Payer: Medicare PPO | Admitting: Nurse Practitioner

## 2020-07-27 ENCOUNTER — Telehealth: Payer: Self-pay | Admitting: Nurse Practitioner

## 2020-07-27 ENCOUNTER — Other Ambulatory Visit: Payer: Self-pay | Admitting: Nurse Practitioner

## 2020-07-27 MED ORDER — NYSTATIN 100000 UNIT/GM EX POWD: Freq: Three times a day (TID) | CUTANEOUS | 1 refills | Status: DC | PRN

## 2020-07-27 NOTE — Telephone Encounter (Signed)
Requested medication (s) are due for refill today: Yes  Requested medication (s) are on the active medication list: Yes  Last refill:  09/14/18  Future visit scheduled: Yes  Notes to clinic:  Prescription has expired.    Requested Prescriptions  Pending Prescriptions Disp Refills   nystatin (NYSTATIN) powder 60 g 1    Sig: Apply topically 4 (four) times daily.      Off-Protocol Failed - 07/27/2020  1:48 PM      Failed - Medication not assigned to a protocol, review manually.      Passed - Valid encounter within last 12 months    Recent Outpatient Visits           4 months ago Mixed hyperlipidemia   [place], Barbaraann Faster, NP   6 months ago Annual physical exam   [place], Henrine Screws T, NP   1 year ago Depression, major, single episode, severe ([place])   [place], Barbaraann Faster, NP   1 year ago Annual physical exam   [place] Marnee Guarneri T, NP   1 year ago Depression, major, single episode, severe [facility])   [place], Rachel Elizabeth, PA-C       Future Appointments             In 1 month Cannady, Barbaraann Faster, NP [employer]

## 2020-07-27 NOTE — Telephone Encounter (Signed)
Medication Refill - Medication: Nystatin powder  Has the patient contacted their pharmacy? No. (Agent: If no, request that the patient contact the pharmacy for the refill.) (Agent: If yes, when and what did the pharmacy advise?)  Preferred Pharmacy (with phone number or street name): [facility]  Agent: Please be advised that RX refills may take up to 3 business days. We ask that you follow-up with your pharmacy.

## 2020-07-27 NOTE — Telephone Encounter (Signed)
Copied from [place] [id number]. Topic: Medicare AWV >> Jul 27, 2020 12:43 PM Cher Nakai R wrote: Reason for CRM:   Left message for patient to call back and schedule the Medicare Annual Wellness Visit (AWV) virtually.  Last AWV 08/02/2019  Please schedule at anytime with [facility]-Nurse Health Advisor.  45 minute appointment  Any questions, please call me at [phone]

## 2020-07-31 ENCOUNTER — Telehealth: Payer: Self-pay

## 2020-07-31 NOTE — Telephone Encounter (Signed)
PA for Nystatin powder initiated and submitted via Cover My Meds. Key: Ammie Dalton

## 2020-08-01 NOTE — Telephone Encounter (Signed)
PA approved.

## 2020-08-07 ENCOUNTER — Other Ambulatory Visit: Payer: Self-pay | Admitting: Nurse Practitioner

## 2020-08-07 ENCOUNTER — Ambulatory Visit (INDEPENDENT_AMBULATORY_CARE_PROVIDER_SITE_OTHER): Payer: Medicare PPO | Admitting: Internal Medicine

## 2020-08-07 DIAGNOSIS — G4733 Obstructive sleep apnea (adult) (pediatric): Secondary | ICD-10-CM

## 2020-08-07 DIAGNOSIS — Z9989 Dependence on other enabling machines and devices: Secondary | ICD-10-CM | POA: Diagnosis not present

## 2020-08-07 NOTE — Patient Instructions (Signed)

## 2020-08-07 NOTE — Progress Notes (Signed)
[facility] [place], [place] [address]  Pulmonary Sleep Medicine   Office Visit Note  Patient Name: Barbara Fitzpatrick DOB: 30-Jun-1951 MRN [id number]  Date of Service: 08/07/2020  Chief Complaint: Obstructive Sleep Apnea visit  Brief History:  [place] is seen today for follow-up on new CPAP The patient has a 4 year history of sleep apnea. Patient is using PAP nightly.  The patient feels better after sleeping with PAP.  The patient reports benefit from PAP use. Reported sleepiness is  Well controlled and the Epworth Sleepiness Score is 2 out of 24. The patient does not take naps. The patient complains of the following: no complaints with mask or machine at this visit.  The compliance download shows  compliance with an average use time of 9 hours. The AHI is 0.8  The patient does not complain of limb movements disrupting sleep.  ROS  General: (-) fever, (-) chills, (-) night sweat Nose and Sinuses: (-) nasal stuffiness or itchiness, (-) postnasal drip, (-) nosebleeds, (-) sinus trouble. Mouth and Throat: (-) sore throat, (-) hoarseness. Neck: (-) swollen glands, (-) enlarged thyroid, (-) neck pain. Respiratory: - cough, - shortness of breath, - wheezing. Neurologic: - numbness, - tingling. Psychiatric: - anxiety, - depression   Current Medication: Outpatient Encounter Medications as of 08/07/2020  Medication Sig  . acyclovir ointment (ZOVIRAX) 5 % Apply 1 application topically 3 (three) times daily as needed.  Marland Kitchen aluminum chloride (DRYSOL) 20 % external solution Apply topically at bedtime. (Patient not taking: Reported on 01/18/2020)  . aspirin 81 MG tablet Take 81 mg by mouth daily.  Marland Kitchen atorvastatin (LIPITOR) 80 MG tablet Take 1 tablet (80 mg total) by mouth daily.  Marland Kitchen buPROPion (WELLBUTRIN XL) 300 MG 24 hr tablet TAKE 1 TABLET(300 MG) BY MOUTH DAILY  . cetirizine (ZYRTEC) 10 MG tablet Take 10 mg by mouth daily.  . Cholecalciferol (VITAMIN D-3 PO) Take 3,000  Units by mouth daily.   . Coenzyme Q10 (CO Q-10 PO) Take by mouth daily.  . cyclobenzaprine (FLEXERIL) 10 MG tablet Take 1 tablet (10 mg total) by mouth 3 (three) times daily as needed for muscle spasms.  Marland Kitchen EPIPEN 2-PAK 0.3 MG/0.3ML SOAJ injection as needed.   Marland Kitchen glucosamine-chondroitin 500-400 MG tablet Take 1 tablet by mouth 2 (two) times daily.  . hydrochlorothiazide (HYDRODIURIL) 25 MG tablet TAKE 1 TABLET(25 MG) BY MOUTH DAILY  . Magnesium 250 MG TABS Take by mouth daily.   . Methylcellulose, Laxative, (FIBER THERAPY PO) Take 1 tablet by mouth daily.  . Multiple Vitamins-Minerals (MULTIVITAMIN PO) Take by mouth daily.  Marland Kitchen nystatin (NYSTATIN) powder Apply topically 3 (three) times daily as needed.  . nystatin cream (MYCOSTATIN) Apply 1 application topically 2 (two) times daily.  . Olopatadine HCl 0.6 % SOLN U 2 SPRAYS IEN BID  . omeprazole (PRILOSEC) 20 MG capsule Take 20 mg by mouth daily.  . pramipexole (MIRAPEX) 0.5 MG tablet Take 1 tablet (0.5 mg total) by mouth at bedtime as needed.  . Probiotic Product (PROBIOTIC COLON SUPPORT PO) Take by mouth. Take one pill every other day  . quinapril (ACCUPRIL) 40 MG tablet TAKE 1 TABLET(40 MG) BY MOUTH AT BEDTIME  . valACYclovir (VALTREX) 1000 MG tablet Take one tablet twice daily at the first sign of a flare for about 3-5 days  . vitamin B-12 (CYANOCOBALAMIN) 1000 MCG tablet Take 1,000 mcg by mouth daily.  . vitamin E (VITAMIN E) 1000 UNIT capsule Take 1,000 Units by mouth  daily.   No facility-administered encounter medications on file as of 08/07/2020.    Surgical History: Past Surgical History:  Procedure Laterality Date  . COLONOSCOPY    . hysterectomy     with bladder tack  . TUBAL LIGATION      Medical History: Past Medical History:  Diagnosis Date  . Arthritis   . Depression   . GERD (gastroesophageal reflux disease)   . Hyperlipidemia   . Hypertension   . Personal history of colonic adenomas 06/16/2013   4 adenomas (max 1  cm)  04/2003  no polyps 04/2005   . Sleep apnea    uses c-pap    Family History: Non contributory to the present illness  Social History: Social History   Socioeconomic History  . Marital status: Single    Spouse name: Not on file  . Number of children: Not on file  . Years of education: Not on file  . Highest education level: 12th grade  Occupational History  . Occupation: retired  Tobacco Use  . Smoking status: Never Smoker  . Smokeless tobacco: Never Used  Vaping Use  . Vaping Use: Never used  Substance and Sexual Activity  . Alcohol use: No  . Drug use: No  . Sexual activity: Never  Other Topics Concern  . Not on file  Social History Narrative  . Not on file   Social Determinants of Health   Financial Resource Strain:   . Difficulty of Paying Living Expenses:   Food Insecurity:   . Worried About Charity fundraiser in the Last Year:   . Arboriculturist in the Last Year:   Transportation Needs:   . Film/video editor (Medical):   Marland Kitchen Lack of Transportation (Non-Medical):   Physical Activity:   . Days of Exercise per Week:   . Minutes of Exercise per Session:   Stress:   . Feeling of Stress :   Social Connections:   . Frequency of Communication with Friends and Family:   . Frequency of Social Gatherings with Friends and Family:   . Attends Religious Services:   . Active Member of Clubs or Organizations:   . Attends Archivist Meetings:   Marland Kitchen Marital Status:   Intimate Partner Violence:   . Fear of Current or Ex-Partner:   . Emotionally Abused:   Marland Kitchen Physically Abused:   . Sexually Abused:     Vital Signs: Blood pressure (!) 147/87, pulse 85, weight 224 lb (101.6 kg), SpO2 96 %.  Examination: General Appearance: The patient is well-developed, well-nourished, and in no distress. Neck Circumference: 42  Skin: Gross inspection of skin unremarkable. Head: normocephalic, no gross deformities. Eyes: no gross deformities noted. ENT: ears appear  grossly normal Neurologic: Alert and oriented. No involuntary movements.    EPWORTH SLEEPINESS SCALE:  Scale:  (0)= no chance of dozing; (1)= slight chance of dozing; (2)= moderate chance of dozing; (3)= high chance of dozing  Chance  Situtation    Sitting and reading: 0    Watching TV: 2    Sitting Inactive in public: 0    As a passenger in car: 0      Lying down to rest: 0    Sitting and talking: 0    Sitting quielty after lunch: 0    In a car, stopped in traffic: 0   TOTAL SCORE:   2 out of 24    SLEEP STUDIES:  1. PSG 2017, AHI 10.1, SpO2 minimum 79%  CPAP COMPLIANCE DATA:  Date Range: 07/04/2020-08/02/2020  Average Daily Use: 9 hours  Median Use: 9 hours  Compliance for > 4 Hours: 30 days  AHI: 0.8 respiratory events per hour  Days Used: 30/30  Mask Leak: 16.0   LABS: No results found for this or any previous visit (from the past 2160 hour(s)).  Radiology: MM 3D SCREEN BREAST BILATERAL  Result Date: 02/16/2020 CLINICAL DATA:  Screening. EXAM: DIGITAL SCREENING BILATERAL MAMMOGRAM WITH TOMO AND CAD COMPARISON:  Previous exam(s). ACR Breast Density Category b: There are scattered areas of fibroglandular density. FINDINGS: There are no findings suspicious for malignancy. Images were processed with CAD. IMPRESSION: No mammographic evidence of malignancy. A result letter of this screening mammogram will be mailed directly to the patient. RECOMMENDATION: Screening mammogram in one year. (Code:SM-B-01Y) BI-RADS CATEGORY  1: Negative. Electronically Signed   By: Zerita Boers M.D.   On: 02/16/2020 15:57   Assessment and Plan: Patient Active Problem List   Diagnosis Date Noted  . Bilateral carotid bruits 01/11/2020  . Osteopenia 07/23/2018  . Depression, major, single episode, severe ([place]) 06/17/2018  . GERD (gastroesophageal reflux disease) 11/03/2017  . Advanced care planning/counseling discussion 11/03/2017  . Abnormal mammogram of right breast  04/30/2017  . Elevated liver enzymes 03/18/2017  . RLS (restless legs syndrome) 10/15/2016  . Sleep apnea 10/15/2016  . Obesity 03/13/2016  . Prediabetes 09/15/2015  . Benign hypertension 09/15/2015  . Hyperlipemia 09/15/2015      The patient does tolerate PAP and reports benefit from PAP use. The patient was reminded how to clean machine and advised to continue with wearing CPAP nightly. The patient was also counselled on wearing CPAP if lying down to take naps during the day. The compliance is great. The AHI is 0.8.  1. OSA on CPAP Continue with current CPAP settings and will follow-up in 1 year for compliance.  2. Morbid obesity ([place]) Discussed incorporating healthy eating habits as well as daily exercise into her routine. Discussed the negative impact obesity has on her OSA as well as overall health.  General Counseling: I have discussed the findings of the evaluation and examination with Anea.  I have also discussed any further diagnostic evaluation thatmay be needed or ordered today. Janaisa verbalizes understanding of the findings of todays visit. We also reviewed her medications today and discussed drug interactions and side effects including but not limited excessive drowsiness and altered mental states. We also discussed that there is always a risk not just to her but also people around her. she has been encouraged to call the office with any questions or concerns that should arise related to todays visit.  This patient was seen today by Theodoro Grist, AGNP-C in collaboration with Dr. Allyne Gee as part of a collaborative care agreement.    I have personally obtained a history, examined the patient, evaluated laboratory and imaging results, formulated the assessment and plan and placed orders.   Richelle Ito Saunders Glance, PhD, FAASM  Diplomate, American Board of Sleep Medicine    Allyne Gee, MD [facility] Diplomate ABMS Pulmonary and Critical Care Medicine Sleep medicine

## 2020-08-11 ENCOUNTER — Ambulatory Visit (INDEPENDENT_AMBULATORY_CARE_PROVIDER_SITE_OTHER): Payer: Medicare PPO

## 2020-08-11 VITALS — BP 109/76 | HR 70 | Ht 64.0 in | Wt 222.0 lb

## 2020-08-11 DIAGNOSIS — Z0000 Encounter for general adult medical examination without abnormal findings: Secondary | ICD-10-CM

## 2020-08-11 NOTE — Progress Notes (Signed)
I connected with Barbara Fitzpatrick today by telephone and verified that I am speaking with the correct person using two identifiers. Location patient: home Location provider: work Persons participating in the virtual visit: Demeshia, [place] LPN.   I discussed the limitations, risks, security and privacy concerns of performing an evaluation and management service by telephone and the availability of in person appointments. I also discussed with the patient that there may be a patient responsible charge related to this service. The patient expressed understanding and verbally consented to this telephonic visit.    Interactive audio and video telecommunications were attempted between this provider and patient, however failed, due to patient having technical difficulties OR patient did not have access to video capability.  We continued and completed visit with audio only.    Vital signs may be patient reported or missing.   Subjective:   Barbara Fitzpatrick is a 69 y.o. female who presents for Medicare Annual (Subsequent) preventive examination.  Review of Systems     Cardiac Risk Factors include: advanced age (>66men, >57 women);dyslipidemia;hypertension;obesity (BMI >30kg/m2);sedentary lifestyle     Objective:    Today's Vitals   08/11/20 1111  BP: 109/76  Pulse: 70  Weight: 222 lb (100.7 kg)  Height: 5\' 4"  (1.626 m)   Body mass index is 38.11 kg/m.  Advanced Directives 08/11/2020 08/02/2019 06/15/2018 04/10/2017  Does Patient Have a Medical Advance Directive? Yes Yes Yes Yes  Type of Paramedic of [place];Living will Living will;Healthcare Power of [place];Living will Living will;Healthcare Power of [place] in Chart? No - copy requested No - copy requested No - copy requested No - copy requested    Current Medications (verified) Outpatient Encounter Medications as of 08/11/2020    Medication Sig  . acyclovir ointment (ZOVIRAX) 5 % Apply 1 application topically 3 (three) times daily as needed.  Marland Kitchen aspirin 81 MG tablet Take 81 mg by mouth daily.  Marland Kitchen atorvastatin (LIPITOR) 80 MG tablet Take 1 tablet (80 mg total) by mouth daily.  Marland Kitchen buPROPion (WELLBUTRIN XL) 300 MG 24 hr tablet TAKE 1 TABLET(300 MG) BY MOUTH DAILY  . cetirizine (ZYRTEC) 10 MG tablet Take 10 mg by mouth daily.  . Cholecalciferol (VITAMIN D-3 PO) Take 3,000 Units by mouth daily.   . Coenzyme Q10 (CO Q-10 PO) Take by mouth daily.  . cyclobenzaprine (FLEXERIL) 10 MG tablet Take 1 tablet (10 mg total) by mouth 3 (three) times daily as needed for muscle spasms.  Marland Kitchen EPIPEN 2-PAK 0.3 MG/0.3ML SOAJ injection as needed.   Marland Kitchen glucosamine-chondroitin 500-400 MG tablet Take 1 tablet by mouth 2 (two) times daily.  . hydrochlorothiazide (HYDRODIURIL) 25 MG tablet TAKE 1 TABLET(25 MG) BY MOUTH DAILY  . Magnesium 250 MG TABS Take by mouth daily.   . Methylcellulose, Laxative, (FIBER THERAPY PO) Take 1 tablet by mouth daily.  . Multiple Vitamins-Minerals (MULTIVITAMIN PO) Take by mouth daily.  Marland Kitchen nystatin (NYSTATIN) powder Apply topically 3 (three) times daily as needed.  . nystatin cream (MYCOSTATIN) Apply 1 application topically 2 (two) times daily.  . Olopatadine HCl 0.6 % SOLN U 2 SPRAYS IEN BID  . omeprazole (PRILOSEC) 20 MG capsule Take 20 mg by mouth daily.  . pramipexole (MIRAPEX) 0.5 MG tablet Take 1 tablet (0.5 mg total) by mouth at bedtime as needed.  . Probiotic Product (PROBIOTIC COLON SUPPORT PO) Take by mouth. Take one pill every other day  . quinapril (  ACCUPRIL) 40 MG tablet TAKE 1 TABLET(40 MG) BY MOUTH AT BEDTIME  . valACYclovir (VALTREX) 1000 MG tablet Take one tablet twice daily at the first sign of a flare for about 3-5 days  . vitamin B-12 (CYANOCOBALAMIN) 1000 MCG tablet Take 1,000 mcg by mouth daily.  . vitamin E (VITAMIN E) 1000 UNIT capsule Take 1,000 Units by mouth daily.  Marland Kitchen aluminum chloride  (DRYSOL) 20 % external solution Apply topically at bedtime. (Patient not taking: Reported on 01/18/2020)   No facility-administered encounter medications on file as of 08/11/2020.    Allergies (verified) Patient has no known allergies.   History: Past Medical History:  Diagnosis Date  . Arthritis   . Depression   . GERD (gastroesophageal reflux disease)   . Hyperlipidemia   . Hypertension   . Personal history of colonic adenomas 06/16/2013   4 adenomas (max 1 cm)  04/2003  no polyps 04/2005   . Sleep apnea    uses c-pap   Past Surgical History:  Procedure Laterality Date  . COLONOSCOPY    . hysterectomy     with bladder tack  . TUBAL LIGATION     Family History  Problem Relation Age of Onset  . Hypertension Mother   . Hyperlipidemia Mother   . Diabetes Mother   . Cancer Mother        colon (benign)  . Glaucoma Mother   . Heart disease Father   . Hypertension Father   . Hyperlipidemia Father   . Diabetes Father   . Diabetes Sister   . Lung disease Sister        polyps  . Cancer Brother        kidney  . Glaucoma Brother   . Hyperlipidemia Brother   . Hypertension Brother   . Stroke Maternal Grandfather   . Diabetes Paternal Grandmother   . Heart disease Paternal Grandfather   . Cancer Maternal Aunt 50       ovarian  . Colon cancer Neg Hx   . Esophageal cancer Neg Hx   . Rectal cancer Neg Hx   . Stomach cancer Neg Hx    Social History   Socioeconomic History  . Marital status: Single    Spouse name: Not on file  . Number of children: Not on file  . Years of education: Not on file  . Highest education level: 12th grade  Occupational History  . Occupation: retired  Tobacco Use  . Smoking status: Never Smoker  . Smokeless tobacco: Never Used  Vaping Use  . Vaping Use: Never used  Substance and Sexual Activity  . Alcohol use: No  . Drug use: No  . Sexual activity: Not Currently  Other Topics Concern  . Not on file  Social History Narrative  . Not on  file   Social Determinants of Health   Financial Resource Strain: Low Risk   . Difficulty of Paying Living Expenses: Not hard at all  Food Insecurity: No Food Insecurity  . Worried About Charity fundraiser in the Last Year: Never true  . Ran Out of Food in the Last Year: Never true  Transportation Needs: No Transportation Needs  . Lack of Transportation (Medical): No  . Lack of Transportation (Non-Medical): No  Physical Activity: Inactive  . Days of Exercise per Week: 0 days  . Minutes of Exercise per Session: 0 min  Stress: No Stress Concern Present  . Feeling of Stress : Not at all  Social Connections:   .  Frequency of Communication with Friends and Family: Not on file  . Frequency of Social Gatherings with Friends and Family: Not on file  . Attends Religious Services: Not on file  . Active Member of Clubs or Organizations: Not on file  . Attends Archivist Meetings: Not on file  . Marital Status: Not on file    Tobacco Counseling Counseling given: Not Answered   Clinical Intake:  Pre-visit preparation completed: Yes  Pain : No/denies pain     Nutritional Status: BMI > 30  Obese Nutritional Risks: None Diabetes: No  How often do you need to have someone help you when you read instructions, pamphlets, or other written materials from your doctor or pharmacy?: 1 - Never What is the last grade level you completed in school?: 12th grade  Diabetic? no  Interpreter Needed?: No  Information entered by :: NAllen LPN   Activities of Daily Living In your present state of health, do you have any difficulty performing the following activities: 08/11/2020 01/11/2020  Hearing? N N  Vision? N N  Difficulty concentrating or making decisions? N N  Walking or climbing stairs? N N  Dressing or bathing? N N  Doing errands, shopping? N N  Preparing Food and eating ? N -  Using the Toilet? N -  In the past six months, have you accidently leaked urine? Y -  Comment  some -  Do you have problems with loss of bowel control? N -  Managing your Medications? N -  Managing your Finances? N -  Housekeeping or managing your Housekeeping? N -  Some recent data might be hidden    Patient Care Team: Kathrine Haddock, NP as PCP - General (Nurse Practitioner)  Indicate any recent Medical Services you may have received from other than [facility] providers in the past year (date may be approximate).     Assessment:   This is a routine wellness examination for Barbara Fitzpatrick.  Hearing/Vision screen  Hearing Screening   125Hz  250Hz  500Hz  1000Hz  2000Hz  3000Hz  4000Hz  6000Hz  8000Hz   Right ear:           Left ear:           Vision Screening Comments: Regular eye exams, [facility]   Dietary issues and exercise activities discussed: Current Exercise Habits: The patient does not participate in regular exercise at present  Goals    . DIET - INCREASE WATER INTAKE     Recommend drinking at least 6-8 glasses of water a day     . Patient Stated     08/11/2020, keeping a diary of meals      Depression Screen PHQ 2/9 Scores 08/11/2020 01/11/2020 08/02/2019 05/18/2019 11/17/2018 09/14/2018 07/14/2018  PHQ - 2 Score 0 5 2 2 2 2 2   PHQ- 9 Score 0 13 2 5 9 13 7     Fall Risk Fall Risk  08/11/2020 01/11/2020 08/02/2019 09/14/2018 06/15/2018  Falls in the past year? 0 0 0 No No  Number falls in past yr: - 0 - - -  Injury with Fall? - 0 - - -  Risk for fall due to : Medication side effect - - - -  Follow up Falls evaluation completed;Education provided;Falls prevention discussed - - - -    Any stairs in or around the home? Yes  If so, are there any without handrails? No  Home free of loose throw rugs in walkways, pet beds, electrical cords, etc? Yes  Adequate lighting in your home to reduce  risk of falls? Yes   ASSISTIVE DEVICES UTILIZED TO PREVENT FALLS:  Life alert? No  Use of a cane, walker or w/c? No  Grab bars in the bathroom? Yes  Shower chair or bench in shower? Yes  Elevated  toilet seat or a handicapped toilet? Yes   TIMED UP AND GO:  Was the test performed? No   Cognitive Function:     6CIT Screen 08/11/2020 06/15/2018  What Year? 0 points 0 points  What month? 0 points 0 points  What time? 0 points 0 points  Count back from 20 0 points 0 points  Months in reverse 0 points 0 points  Repeat phrase 2 points 0 points  Total Score 2 0    Immunizations Immunization History  Administered Date(s) Administered  . Fluad Quad(high Dose 65+) 09/22/2019  . Influenza, High Dose Seasonal PF 11/03/2017, 09/14/2018  . Influenza,inj,Quad PF,6+ Mos 09/15/2015, 10/15/2016  . Moderna SARS-COVID-2 Vaccination 02/08/2020, 03/07/2020  . Pneumococcal Conjugate-13 11/03/2017  . Pneumococcal Polysaccharide-23 09/22/2019  . Tdap 04/23/2012  . Zoster 03/16/2014    TDAP status: Up to date Flu Vaccine status: Up to date Pneumococcal vaccine status: Up to date Covid-19 vaccine status: Completed vaccines  Qualifies for Shingles Vaccine? Yes   Zostavax completed Yes   Shingrix Completed?: No.    Education has been provided regarding the importance of this vaccine. Patient has been advised to call insurance company to determine out of pocket expense if they have not yet received this vaccine. Advised may also receive vaccine at local pharmacy or Health Dept. Verbalized acceptance and understanding.  Screening Tests Health Maintenance  Topic Date Due  . INFLUENZA VACCINE  07/23/2020  . OPHTHALMOLOGY EXAM  08/24/2020  . HEMOGLOBIN A1C  09/16/2020  . FOOT EXAM  01/10/2021  . MAMMOGRAM  02/15/2022  . TETANUS/TDAP  04/23/2022  . COLONOSCOPY  07/01/2022  . DEXA SCAN  Completed  . COVID-19 Vaccine  Completed  . Hepatitis C Screening  Completed  . PNA vac Low Risk Adult  Completed    Health Maintenance  Health Maintenance Due  Topic Date Due  . INFLUENZA VACCINE  07/23/2020    Colorectal cancer screening: Completed 07/01/2017. Repeat every 5 years Mammogram status:  Completed 02/16/2020. Repeat every year Bone Density status: Completed 07/22/2018.   Lung Cancer Screening: (Low Dose CT Chest recommended if Age 37-80 years, 30 pack-year currently smoking OR have quit w/in 15years.) does not qualify.   Lung Cancer Screening Referral: no   Additional Screening:  Hepatitis C Screening: does qualify; Completed 09/15/2015  Vision Screening: Recommended annual ophthalmology exams for early detection of glaucoma and other disorders of the eye. Is the patient up to date with their annual eye exam?  Yes  Who is the provider or what is the name of the office in which the patient attends annual eye exams? [facility] If pt is not established with a provider, would they like to be referred to a provider to establish care? No .   Dental Screening: Recommended annual dental exams for proper oral hygiene  Community Resource Referral / Chronic Care Management: CRR required this visit?  No   CCM required this visit?  No      Plan:     I have personally reviewed and noted the following in the patient's chart:   . Medical and social history . Use of alcohol, tobacco or illicit drugs  . Current medications and supplements . Functional ability and status . Nutritional status . Physical  activity . Advanced directives . List of other physicians . Hospitalizations, surgeries, and ER visits in previous 12 months . Vitals . Screenings to include cognitive, depression, and falls . Referrals and appointments  In addition, I have reviewed and discussed with patient certain preventive protocols, quality metrics, and best practice recommendations. A written personalized care plan for preventive services as well as general preventive health recommendations were provided to patient.     Kellie Simmering, LPN   [id number]   Nurse Notes:

## 2020-08-11 NOTE — Patient Instructions (Signed)
Barbara Fitzpatrick , Thank you for taking time to come for your Medicare Wellness Visit. I appreciate your ongoing commitment to your health goals. Please review the following plan we discussed and let me know if I can assist you in the future.   Screening recommendations/referrals: Colonoscopy: completed 07/01/2017 Mammogram: completed 02/16/2020 Bone Density: completed 07/22/2018 Recommended yearly ophthalmology/optometry visit for glaucoma screening and checkup Recommended yearly dental visit for hygiene and checkup  Vaccinations: Influenza vaccine: due Pneumococcal vaccine: completed 09/22/2019 Tdap vaccine: completed 04/23/2012 Shingles vaccine: discussed   Covid-19:03/07/2020, 02/08/2020  Advanced directives: Please bring a copy of your POA (Power of Attorney) and/or Living Will to your next appointment.   Conditions/risks identified: none  Next appointment: Follow up in one year for your annual wellness visit    Preventive Care 65 Years and Older, Female Preventive care refers to lifestyle choices and visits with your health care provider that can promote health and wellness. What does preventive care include?  A yearly physical exam. This is also called an annual well check.  Dental exams once or twice a year.  Routine eye exams. Ask your health care provider how often you should have your eyes checked.  Personal lifestyle choices, including:  Daily care of your teeth and gums.  Regular physical activity.  Eating a healthy diet.  Avoiding tobacco and drug use.  Limiting alcohol use.  Practicing safe sex.  Taking low-dose aspirin every day.  Taking vitamin and mineral supplements as recommended by your health care provider. What happens during an annual well check? The services and screenings done by your health care provider during your annual well check will depend on your age, overall health, lifestyle risk factors, and family history of disease. Counseling  Your  health care provider may ask you questions about your:  Alcohol use.  Tobacco use.  Drug use.  Emotional well-being.  Home and relationship well-being.  Sexual activity.  Eating habits.  History of falls.  Memory and ability to understand (cognition).  Work and work Statistician.  Reproductive health. Screening  You may have the following tests or measurements:  Height, weight, and BMI.  Blood pressure.  Lipid and cholesterol levels. These may be checked every 5 years, or more frequently if you are over 55 years old.  Skin check.  Lung cancer screening. You may have this screening every year starting at age 73 if you have a 30-pack-year history of smoking and currently smoke or have quit within the past 15 years.  Fecal occult blood test (FOBT) of the stool. You may have this test every year starting at age 56.  Flexible sigmoidoscopy or colonoscopy. You may have a sigmoidoscopy every 5 years or a colonoscopy every 10 years starting at age 33.  Hepatitis C blood test.  Hepatitis B blood test.  Sexually transmitted disease (STD) testing.  Diabetes screening. This is done by checking your blood sugar (glucose) after you have not eaten for a while (fasting). You may have this done every 1-3 years.  Bone density scan. This is done to screen for osteoporosis. You may have this done starting at age 71.  Mammogram. This may be done every 1-2 years. Talk to your health care provider about how often you should have regular mammograms. Talk with your health care provider about your test results, treatment options, and if necessary, the need for more tests. Vaccines  Your health care provider may recommend certain vaccines, such as:  Influenza vaccine. This is recommended every year.  Tetanus, diphtheria, and acellular pertussis (Tdap, Td) vaccine. You may need a Td booster every 10 years.  Zoster vaccine. You may need this after age 43.  Pneumococcal 13-valent  conjugate (PCV13) vaccine. One dose is recommended after age 68.  Pneumococcal polysaccharide (PPSV23) vaccine. One dose is recommended after age 17. Talk to your health care provider about which screenings and vaccines you need and how often you need them. This information is not intended to replace advice given to you by your health care provider. Make sure you discuss any questions you have with your health care provider. Document Released: 01/05/2016 Document Revised: 08/28/2016 Document Reviewed: 10/10/2015 Elsevier Interactive Patient Education  2017 [place] Prevention in the Home Falls can cause injuries. They can happen to people of all ages. There are many things you can do to make your home safe and to help prevent falls. What can I do on the outside of my home?  Regularly fix the edges of walkways and driveways and fix any cracks.  Remove anything that might make you trip as you walk through a door, such as a raised step or threshold.  Trim any bushes or trees on the path to your home.  Use bright outdoor lighting.  Clear any walking paths of anything that might make someone trip, such as rocks or tools.  Regularly check to see if handrails are loose or broken. Make sure that both sides of any steps have handrails.  Any raised decks and porches should have guardrails on the edges.  Have any leaves, snow, or ice cleared regularly.  Use sand or salt on walking paths during winter.  Clean up any spills in your garage right away. This includes oil or grease spills. What can I do in the bathroom?  Use night lights.  Install grab bars by the toilet and in the tub and shower. Do not use towel bars as grab bars.  Use non-skid mats or decals in the tub or shower.  If you need to sit down in the shower, use a plastic, non-slip stool.  Keep the floor dry. Clean up any water that spills on the floor as soon as it happens.  Remove soap buildup in the tub or  shower regularly.  Attach bath mats securely with double-sided non-slip rug tape.  Do not have throw rugs and other things on the floor that can make you trip. What can I do in the bedroom?  Use night lights.  Make sure that you have a light by your bed that is easy to reach.  Do not use any sheets or blankets that are too big for your bed. They should not hang down onto the floor.  Have a firm chair that has side arms. You can use this for support while you get dressed.  Do not have throw rugs and other things on the floor that can make you trip. What can I do in the kitchen?  Clean up any spills right away.  Avoid walking on wet floors.  Keep items that you use a lot in easy-to-reach places.  If you need to reach something above you, use a strong step stool that has a grab bar.  Keep electrical cords out of the way.  Do not use floor polish or wax that makes floors slippery. If you must use wax, use non-skid floor wax.  Do not have throw rugs and other things on the floor that can make you trip. What can I do  with my stairs?  Do not leave any items on the stairs.  Make sure that there are handrails on both sides of the stairs and use them. Fix handrails that are broken or loose. Make sure that handrails are as long as the stairways.  Check any carpeting to make sure that it is firmly attached to the stairs. Fix any carpet that is loose or worn.  Avoid having throw rugs at the top or bottom of the stairs. If you do have throw rugs, attach them to the floor with carpet tape.  Make sure that you have a light switch at the top of the stairs and the bottom of the stairs. If you do not have them, ask someone to add them for you. What else can I do to help prevent falls?  Wear shoes that:  Do not have high heels.  Have rubber bottoms.  Are comfortable and fit you well.  Are closed at the toe. Do not wear sandals.  If you use a stepladder:  Make sure that it is fully  opened. Do not climb a closed stepladder.  Make sure that both sides of the stepladder are locked into place.  Ask someone to hold it for you, if possible.  Clearly mark and make sure that you can see:  Any grab bars or handrails.  First and last steps.  Where the edge of each step is.  Use tools that help you move around (mobility aids) if they are needed. These include:  Canes.  Walkers.  Scooters.  Crutches.  Turn on the lights when you go into a dark area. Replace any light bulbs as soon as they burn out.  Set up your furniture so you have a clear path. Avoid moving your furniture around.  If any of your floors are uneven, fix them.  If there are any pets around you, be aware of where they are.  Review your medicines with your doctor. Some medicines can make you feel dizzy. This can increase your chance of falling. Ask your doctor what other things that you can do to help prevent falls. This information is not intended to replace advice given to you by your health care provider. Make sure you discuss any questions you have with your health care provider. Document Released: 10/05/2009 Document Revised: 05/16/2016 Document Reviewed: 01/13/2015 Elsevier Interactive Patient Education  2017 [employer].

## 2020-09-11 ENCOUNTER — Ambulatory Visit: Payer: Medicare PPO | Admitting: Nurse Practitioner

## 2020-09-12 ENCOUNTER — Ambulatory Visit: Payer: Medicare PPO | Admitting: Nurse Practitioner

## 2020-09-14 ENCOUNTER — Other Ambulatory Visit: Payer: Self-pay

## 2020-09-14 ENCOUNTER — Encounter: Payer: Self-pay | Admitting: Unknown Physician Specialty

## 2020-09-14 ENCOUNTER — Ambulatory Visit (INDEPENDENT_AMBULATORY_CARE_PROVIDER_SITE_OTHER): Payer: Medicare PPO | Admitting: Unknown Physician Specialty

## 2020-09-14 VITALS — BP 139/84 | HR 70 | Temp 97.8°F | Resp 16 | Ht 63.0 in | Wt 217.0 lb

## 2020-09-14 DIAGNOSIS — R7303 Prediabetes: Secondary | ICD-10-CM

## 2020-09-14 DIAGNOSIS — F322 Major depressive disorder, single episode, severe without psychotic features: Secondary | ICD-10-CM

## 2020-09-14 DIAGNOSIS — I10 Essential (primary) hypertension: Secondary | ICD-10-CM

## 2020-09-14 DIAGNOSIS — E782 Mixed hyperlipidemia: Secondary | ICD-10-CM

## 2020-09-14 DIAGNOSIS — G4733 Obstructive sleep apnea (adult) (pediatric): Secondary | ICD-10-CM

## 2020-09-14 DIAGNOSIS — B3789 Other sites of candidiasis: Secondary | ICD-10-CM | POA: Insufficient documentation

## 2020-09-14 DIAGNOSIS — Z23 Encounter for immunization: Secondary | ICD-10-CM | POA: Diagnosis not present

## 2020-09-14 NOTE — Progress Notes (Signed)
BP 139/84    Pulse 70    Temp 97.8 F (36.6 C) (Temporal)    Resp 16    Ht 5\' 3"  (1.6 m)    Wt 217 lb (98.4 kg)    LMP  (LMP Unknown)    BMI 38.44 kg/m    Subjective:    Patient ID: Barbara Fitzpatrick, female    DOB: 09-24-1951, 69 y.o.   MRN: [id number]  HPI: Barbara Fitzpatrick is a 69 y.o. female  Chief Complaint  Patient presents with   Hyperlipidemia   Depression Stable on current medicaitons Depression screen [facility] 2/9 09/14/2020 08/11/2020 01/11/2020 08/02/2019 05/18/2019  Decreased Interest 0 0 3 1 1   Down, Depressed, Hopeless 0 0 2 1 1   PHQ - 2 Score 0 0 5 2 2   Altered sleeping 0 0 0 0 0  Tired, decreased energy 1 0 3 0 1  Change in appetite 0 0 3 0 1  Feeling bad or failure about yourself  0 0 0 0 0  Trouble concentrating 0 0 0 0 1  Moving slowly or fidgety/restless 1 0 2 0 0  Suicidal thoughts 0 0 0 0 0  PHQ-9 Score 2 0 13 2 5   Difficult doing work/chores Not difficult at all Not difficult at all - Not difficult at all Not difficult at all  Some recent data might be hidden    Hypertension Using medications without difficulty.   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    Yeast Groin area.  Controlled with powder and nystatin cream prn  Cold sores Acyclovir cream prn  Relevant past medical, surgical, family and social history reviewed and updated as indicated. Interim medical history since our last visit reviewed. Allergies and medications reviewed and updated.  Review of Systems  Per HPI unless specifically indicated above     Objective:    BP 139/84    Pulse 70    Temp 97.8 F (36.6 C) (Temporal)    Resp 16    Ht 5\' 3"  (1.6 m)    Wt 217 lb (98.4 kg)    LMP  (LMP Unknown)    BMI 38.44 kg/m   Wt Readings  from Last 3 Encounters:  09/14/20 217 lb (98.4 kg)  08/11/20 222 lb (100.7 kg)  08/07/20 224 lb (101.6 kg)    Physical Exam Constitutional:      Appearance: She is well-developed.  HENT:     Head: Normocephalic and atraumatic.  Eyes:     General: No scleral icterus.       Right eye: No discharge.        Left eye: No discharge.     Pupils: Pupils are equal, round, and reactive to light.  Neck:     Thyroid: No thyromegaly.     Vascular: No carotid bruit.  Cardiovascular:     Rate and Rhythm: Normal rate and regular rhythm.     Heart sounds: Normal heart sounds. No murmur heard.  No friction rub. No gallop.   Pulmonary:     Effort: Pulmonary effort is normal. No respiratory distress.     Breath sounds: Normal breath sounds. No wheezing or rales.  Abdominal:     General: Bowel sounds are normal.  Palpations: Abdomen is soft.     Tenderness: There is no abdominal tenderness. There is no rebound.  Musculoskeletal:        General: Normal range of motion.     Cervical back: Normal range of motion and neck supple.  Lymphadenopathy:     Cervical: No cervical adenopathy.  Skin:    General: Skin is warm and dry.     Findings: No rash.  Neurological:     Mental Status: She is alert and oriented to person, place, and time.  Psychiatric:        Speech: Speech normal.        Behavior: Behavior normal.        Thought Content: Thought content normal.        Judgment: Judgment normal.     Results for orders placed or performed in visit on 05/04/20  Lipid panel  Result Value Ref Range   Cholesterol, Total 155 100 - 199 mg/dL   Triglycerides 254 (H) 0 - 149 mg/dL   HDL 32 (L) >39 mg/dL   VLDL Cholesterol Cal 42 (H) 5 - 40 mg/dL   LDL Chol Calc (NIH) 81 0 - 99 mg/dL   Chol/HDL Ratio 4.8 (H) 0.0 - 4.4 ratio      Assessment & Plan:   Problem List Items Addressed This Visit      Unprioritized   Benign hypertension    Stable, continue present medications.        Relevant  Orders   Comprehensive metabolic panel   TSH   Candida rash of groin   Depression, major, single episode, severe (HCC)    Stable, continue present medications.        Hyperlipemia    Check lipid panel today      Relevant Orders   Comprehensive metabolic panel   Lipid Panel w/o Chol/HDL Ratio   Prediabetes    Check Hgb A1C      Relevant Orders   Hgb A1c w/o eAG   Sleep apnea    Uses CPAP       Other Visit Diagnoses    Needs flu shot    -  Primary   Relevant Orders   Flu Vaccine QUAD High Dose(Fluad) (Completed)      Shingles vaccine.  Recommended going to the pharmacy for Shingles vaccine   Follow up plan: Return in about 6 months (around 03/14/2021).

## 2020-09-14 NOTE — Assessment & Plan Note (Signed)
Uses CPAP 

## 2020-09-14 NOTE — Assessment & Plan Note (Signed)
Stable, continue present medications.   

## 2020-09-14 NOTE — Assessment & Plan Note (Signed)
Check lipid panel today 

## 2020-09-14 NOTE — Assessment & Plan Note (Signed)
Check Hgb A1C 

## 2020-09-15 LAB — COMPREHENSIVE METABOLIC PANEL
ALT: 30 IU/L (ref 0–32)
AST: 20 IU/L (ref 0–40)
Albumin/Globulin Ratio: 1.7 (ref 1.2–2.2)
Albumin: 4.3 g/dL (ref 3.8–4.8)
Alkaline Phosphatase: 106 IU/L (ref 44–121)
BUN/Creatinine Ratio: 9 — ABNORMAL LOW (ref 12–28)
BUN: 8 mg/dL (ref 8–27)
Bilirubin Total: 0.5 mg/dL (ref 0.0–1.2)
CO2: 26 mmol/L (ref 20–29)
Calcium: 10.1 mg/dL (ref 8.7–10.3)
Chloride: 102 mmol/L (ref 96–106)
Creatinine, Ser: 0.89 mg/dL (ref 0.57–1.00)
GFR calc Af Amer: 77 mL/min/{1.73_m2} (ref >59)
GFR calc non Af Amer: 67 mL/min/{1.73_m2} (ref >59)
Globulin, Total: 2.6 g/dL (ref 1.5–4.5)
Glucose: 113 mg/dL — ABNORMAL HIGH (ref 65–99)
Potassium: 3.9 mmol/L (ref 3.5–5.2)
Sodium: 142 mmol/L (ref 134–144)
Total Protein: 6.9 g/dL (ref 6.0–8.5)

## 2020-09-15 LAB — LIPID PANEL W/O CHOL/HDL RATIO
Cholesterol, Total: 145 mg/dL (ref 100–199)
HDL: 34 mg/dL — ABNORMAL LOW (ref >39)
LDL Chol Calc (NIH): 80 mg/dL (ref 0–99)
Triglycerides: 181 mg/dL — ABNORMAL HIGH (ref 0–149)
VLDL Cholesterol Cal: 31 mg/dL (ref 5–40)

## 2020-09-15 LAB — HGB A1C W/O EAG: Hgb A1c MFr Bld: 5.8 % — ABNORMAL HIGH (ref 4.8–5.6)

## 2020-09-15 LAB — TSH: TSH: 2.15 u[IU]/mL (ref 0.450–4.500)

## 2020-10-06 ENCOUNTER — Other Ambulatory Visit: Payer: Self-pay | Admitting: Nurse Practitioner

## 2020-10-06 NOTE — Telephone Encounter (Signed)
Requested Prescriptions  Pending Prescriptions Disp Refills   quinapril (ACCUPRIL) 40 MG tablet [Pharmacy Med Name: QUINAPRIL 40MG  TABLETS] 90 tablet 1    Sig: TAKE 1 TABLET(40 MG) BY MOUTH AT BEDTIME     Cardiovascular:  ACE Inhibitors Passed - 10/06/2020  7:09 AM      Passed - Cr in normal range and within 180 days    Creatinine, Ser  Date Value Ref Range Status  09/14/2020 0.89 0.57 - 1.00 mg/dL Final         Passed - K in normal range and within 180 days    Potassium  Date Value Ref Range Status  09/14/2020 3.9 3.5 - 5.2 mmol/L Final         Passed - Patient is not pregnant      Passed - Last BP in normal range    BP Readings from Last 1 Encounters:  09/14/20 139/84         Passed - Valid encounter within last 6 months    Recent Outpatient Visits          3 weeks ago Needs flu shot   [facility] Kathrine Haddock, NP   6 months ago Mixed hyperlipidemia   [place], Barbaraann Faster, NP   8 months ago Annual physical exam   [place], Henrine Screws T, NP   1 year ago Depression, major, single episode, severe ([place])   [place], Barbaraann Faster, NP   1 year ago Annual physical exam   [place], Barbaraann Faster, NP      Future Appointments            In 3 months Cann[place], Barbaraann Faster, NP [facility]

## 2020-10-13 ENCOUNTER — Other Ambulatory Visit: Payer: Self-pay | Admitting: Unknown Physician Specialty

## 2020-10-13 MED ORDER — QUINAPRIL HCL 40 MG PO TABS: ORAL_TABLET | ORAL | 1 refills | Status: DC

## 2020-10-13 NOTE — Telephone Encounter (Signed)
The medication quinapril e-scribed said verification status not available for this order. Please resend to [facility] in [place] [address]

## 2020-11-19 ENCOUNTER — Other Ambulatory Visit: Payer: Self-pay | Admitting: Nurse Practitioner

## 2020-11-19 NOTE — Telephone Encounter (Signed)
Requested Prescriptions  Pending Prescriptions Disp Refills  . buPROPion (WELLBUTRIN XL) 300 MG 24 hr tablet [Pharmacy Med Name: BUPROPION XL 300MG  TABLETS] 90 tablet 1    Sig: TAKE 1 TABLET(300 MG) BY MOUTH DAILY     Psychiatry: Antidepressants - bupropion Passed - 11/19/2020 11:17 AM      Passed - Completed PHQ-2 or PHQ-9 in the last 360 days      Passed - Last BP in normal range    BP Readings from Last 1 Encounters:  09/14/20 139/84         Passed - Valid encounter within last 6 months    Recent Outpatient Visits          2 months ago Needs flu shot   [facility] Kathrine Haddock, NP   8 months ago Mixed hyperlipidemia   [place], Barbaraann Faster, NP   10 months ago Annual physical exam   [place], Henrine Screws T, NP   1 year ago Depression, major, single episode, severe ([place])   [place], Barbaraann Faster, NP   2 years ago Annual physical exam   [place], Barbaraann Faster, NP      Future Appointments            In 2 months Cannady, Barbaraann Faster, NP [facility]

## 2020-12-28 ENCOUNTER — Ambulatory Visit: Payer: Self-pay | Admitting: *Deleted

## 2020-12-28 NOTE — Telephone Encounter (Signed)
Patient has home kit with positive Covid results. Congestion, dry cough. Denies SOB/CP/fever.  Care advice: keep room temperature cooler, increase daily water intake, NS nasal spray/OTC decongestant compatible with her blood pressure medication/shower steam before bed if needed.  Tylenol or ibuprofen okay for discomfort. With any SOB/difficulty breathing seek evaluation immediately. Patient stated understanding discussion. Reason for Disposition . 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 COVID-19 DIAGNOSIS: "Who made your COVID-19 diagnosis?" "Was it confirmed by a positive lab test?" If not diagnosed by a HCP, ask "Are there lots of cases (community spread) where you live?" Note: See public health department website, if unsure.     at home kit 2. COVID-19 EXPOSURE: "Was there any known exposure to COVID before the symptoms began?" CDC Definition of close contact: within 6 feet (2 meters) for a total of 15 minutes or more over a 24-hour period.      na 3. ONSET: "When did the COVID-19 symptoms start?"      Several days ago 4. WORST SYMPTOM: "What is your worst symptom?" (e.g., cough, fever, shortness of breath, muscle aches)     congestion5. COUGH: "Do you have a cough?" If Yes, ask: "How bad is the cough?"       Mainly daytime dry cough 6. FEVER: "Do you have a fever?" If Yes, ask: "What is your temperature, how was it measured, and when did it start?"     No fever 7. RESPIRATORY STATUS: "Describe your breathing?" (e.g., shortness of breath, wheezing, unable to speak)     No SOB/wheezing 8. BETTER-SAME-WORSE: "Are you getting better, staying the same or getting worse compared to yesterday?"  If getting worse, ask, "In what way?"     *No Answer* 9. HIGH RISK DISEASE: "Do you have any chronic medical problems?" (e.g., asthma,  heart or lung disease, weak immune system, obesity, etc.)     *No Answer* 10. VACCINE: "Have you gotten the COVID-19 vaccine?" If Yes ask: "Which one, how many shots, when did you get it?"       *No Answer* 11. PREGNANCY: "Is there any chance you are pregnant?" "When was your last menstrual period?"       *No Answer* 12. OTHER SYMPTOMS: "Do you have any other symptoms?"  (e.g., chills, fatigue, headache, loss of smell or taste, muscle pain, sore throat; new loss of smell or taste especially support the diagnosis of COVID-19)       *No Answer*  Protocols used: CORONAVIRUS (COVID-19) DIAGNOSED OR SUSPECTED-A-AH

## 2021-01-02 ENCOUNTER — Other Ambulatory Visit: Payer: Medicare PPO

## 2021-01-02 DIAGNOSIS — Z20822 Contact with and (suspected) exposure to covid-19: Secondary | ICD-10-CM

## 2021-01-04 LAB — SARS-COV-2, NAA 2 DAY TAT

## 2021-01-04 LAB — NOVEL CORONAVIRUS, NAA: SARS-CoV-2, NAA: DETECTED — AB

## 2021-01-18 ENCOUNTER — Other Ambulatory Visit: Payer: Self-pay | Admitting: Nurse Practitioner

## 2021-01-18 DIAGNOSIS — Z1231 Encounter for screening mammogram for malignant neoplasm of breast: Secondary | ICD-10-CM

## 2021-01-25 ENCOUNTER — Encounter: Payer: Self-pay | Admitting: Nurse Practitioner

## 2021-01-25 ENCOUNTER — Other Ambulatory Visit: Payer: Self-pay

## 2021-01-25 ENCOUNTER — Ambulatory Visit (INDEPENDENT_AMBULATORY_CARE_PROVIDER_SITE_OTHER): Payer: Medicare PPO | Admitting: Nurse Practitioner

## 2021-01-25 VITALS — BP 133/80 | HR 73 | Temp 98.0°F | Ht 63.7 in | Wt 244.8 lb

## 2021-01-25 DIAGNOSIS — I10 Essential (primary) hypertension: Secondary | ICD-10-CM

## 2021-01-25 DIAGNOSIS — Z1231 Encounter for screening mammogram for malignant neoplasm of breast: Secondary | ICD-10-CM

## 2021-01-25 DIAGNOSIS — K219 Gastro-esophageal reflux disease without esophagitis: Secondary | ICD-10-CM

## 2021-01-25 DIAGNOSIS — F322 Major depressive disorder, single episode, severe without psychotic features: Secondary | ICD-10-CM | POA: Diagnosis not present

## 2021-01-25 DIAGNOSIS — G4733 Obstructive sleep apnea (adult) (pediatric): Secondary | ICD-10-CM

## 2021-01-25 DIAGNOSIS — R413 Other amnesia: Secondary | ICD-10-CM | POA: Insufficient documentation

## 2021-01-25 DIAGNOSIS — R7303 Prediabetes: Secondary | ICD-10-CM

## 2021-01-25 DIAGNOSIS — Z6841 Body Mass Index (BMI) 40.0 and over, adult: Secondary | ICD-10-CM

## 2021-01-25 DIAGNOSIS — E782 Mixed hyperlipidemia: Secondary | ICD-10-CM | POA: Diagnosis not present

## 2021-01-25 DIAGNOSIS — Z0000 Encounter for general adult medical examination without abnormal findings: Secondary | ICD-10-CM

## 2021-01-25 DIAGNOSIS — M8588 Other specified disorders of bone density and structure, other site: Secondary | ICD-10-CM

## 2021-01-25 MED ORDER — ATORVASTATIN CALCIUM 80 MG PO TABS: 80.0000 mg | ORAL_TABLET | Freq: Every day | ORAL | 4 refills | Status: DC

## 2021-01-25 MED ORDER — QUINAPRIL HCL 40 MG PO TABS: ORAL_TABLET | ORAL | 4 refills | Status: DC

## 2021-01-25 MED ORDER — HYDROCHLOROTHIAZIDE 25 MG PO TABS: ORAL_TABLET | ORAL | 4 refills | Status: DC

## 2021-01-25 MED ORDER — BUPROPION HCL ER (XL) 300 MG PO TB24: ORAL_TABLET | ORAL | 4 refills | Status: DC

## 2021-01-25 NOTE — Assessment & Plan Note (Signed)
Has concern for memory changes, current 6CIT is remaining 2.  No changes.  Neuro exam normal today.  Recommend to continue to monitor and work on memory exercises at home.  May trial Prevagen at home.  Return for worsening or ongoing symptoms, could consider further lab work-up and imaging if worsening.

## 2021-01-25 NOTE — Assessment & Plan Note (Signed)
Chronic, stable.  Continue current medication regimen and adjust as needed.  Lipid panel today. 

## 2021-01-25 NOTE — Assessment & Plan Note (Addendum)
Chronic, ongoing.  Continue current medication regimen, Wellbutrin 300 MG daily.  PHQ9 today = 0.  Denies SI/HI.  She does not wish to adjust anything at this time.  Return in 6 months.  Refills sent in.

## 2021-01-25 NOTE — Assessment & Plan Note (Addendum)
Chronic, continue daily supplements.  DEXA repeat in 2024.  Vitamin D level today. 

## 2021-01-25 NOTE — Progress Notes (Signed)
BP 133/80   Pulse 73   Temp 98 F (36.7 C) (Oral)   Ht 5' 3.7" (1.618 m)   Wt 244 lb 12.8 oz (111 kg)   LMP  (LMP Unknown)   SpO2 98%   BMI 42.42 kg/m    Subjective:    Patient ID: Barbara PiccoloKaren M Fitzpatrick, female    DOB: 07/11/1951, 70 y.o.   MRN: [id number]  HPI: Barbara Fitzpatrick is a 70 y.o. female presenting on 01/25/2021 for comprehensive medical examination. Current medical complaints include:none  She currently lives with: self Menopausal Symptoms: no   HYPERTENSION/HLD Continues on Accupril 40 MG + HCTZ 25 MG & Lipitor 40 MG.  Does have CPAP and uses 100% of the time.  Would like to try HCTZ every other day -- has dry skin and urinates a lot with this.  Endorses some memory changes -- has Prevagen at home and has not tried this yet.  Has history of Covid in July 2021 -- overall improved at this time.  Is interested in Plenity for weight loss -- will check with insurance.   Hypertension status: controlled  Satisfied with current treatment? yes Duration of hypertension: chronic BP monitoring frequency:  not checking BP range:  BP medication side effects:  no Medication compliance: good compliance Previous BP meds: none Aspirin: yes Recurrent headaches: none Visual changes: no Palpitations: no Dyspnea: no Chest pain: no Lower extremity edema: no Dizzy/lightheaded: no  6CIT Screen 01/25/2021 08/11/2020 06/15/2018  What Year? 0 points 0 points 0 points  What month? 0 points 0 points 0 points  What time? 0 points 0 points 0 points  Count back from 20 0 points 0 points 0 points  Months in reverse 2 points 0 points 0 points  Repeat phrase 0 points 2 points 0 points  Total Score 2 2 0   IFG: Last A1C in September 5.8%.   Polydipsia/polyuria: yes Visual disturbance: no Chest pain: no Paresthesias: no  GERD: Chronic, stable on current medication regimen (Omeprazole).  Denies N&V, heart burn, or dysphagia. GERD control status: stable  Satisfied with current treatment?  yes Heartburn frequency: none Medication side effects: no  Medication compliance: stable Previous GERD medications: none Dysphagia: no Odynophagia:  no Hematemesis: no Blood in stool: no EGD: yes   OSTEOPENIA: Noticed on DEXA in 2019, continues on Vit D and CA+ daily.  No recent falls or fractures.  Recent DEXA The BMD measured at [facility] Spine L1-L4 (L3) is 1.037 g/cm2 with a T-score of -1.2.  DEPRESSION Reports good control with Wellbutrin. Mood status: controlled Satisfied with current treatment?: is satisfied with medicaiton, but does report increase dose has caused weight gain Symptom severity: mild  Duration of current treatment : chronic Side effects: no Medication compliance: excellent compliance Psychotherapy/counseling: no never gone Previous psychiatric medications:none Depressed mood: no Anxious mood: no Anhedonia: no Significant weight loss or gain: yes Insomnia: no issues Fatigue: no Feelings of worthlessness or guilt: no Impaired concentration/indecisiveness: no Suicidal ideations: no Hopelessness: no Crying spells: none Depression Screen done today and results listed below:  [facility] 01/25/2021 09/14/2020 08/11/2020 01/11/2020 08/02/2019  Decreased Interest 0 0 0 3 1  Down, Depressed, Hopeless 0 0 0 2 1  PHQ - 2 Score 0 0 0 5 2  Altered sleeping 0 0 0 0 0  Tired, decreased energy 0 1 0 3 0  Change in appetite 0 0 0 3 0  Feeling bad or failure about yourself  0 0 0  0 0  Trouble concentrating 0 0 0 0 0  Moving slowly or fidgety/restless 0 1 0 2 0  Suicidal thoughts 0 0 0 0 0  PHQ-9 Score 0 2 0 13 2  Difficult doing work/chores - Not difficult at all Not difficult at all - Not difficult at all  Some recent data might be hidden   GAD 7 : Generalized Anxiety Score 09/14/2020 01/11/2020 11/17/2018  Nervous, Anxious, on Edge 1 0 1  Control/stop worrying 1 0 0  Worry too much - different things 1 0 0  Trouble relaxing 1 0 1  Restless 2 0 0  Easily  annoyed or irritable 1 0 0  Afraid - awful might happen 1 0 0  Total GAD 7 Score 8 0 2  Anxiety Difficulty Not difficult at all Not difficult at all -    The patient does not have a history of falls. I did not complete a risk assessment for falls. A plan of care for falls was not documented.   Past Medical History:  Past Medical History:  Diagnosis Date  . Arthritis   . Depression   . GERD (gastroesophageal reflux disease)   . Hyperlipidemia   . Hypertension   . Personal history of colonic adenomas 06/16/2013   4 adenomas (max 1 cm)  04/2003  no polyps 04/2005   . Sleep apnea    uses c-pap    Surgical History:  Past Surgical History:  Procedure Laterality Date  . COLONOSCOPY    . hysterectomy     with bladder tack  . TUBAL LIGATION      Medications:  Current Outpatient Medications on File Prior to Visit  Medication Sig  . acyclovir ointment (ZOVIRAX) 5 % Apply 1 application topically 3 (three) times daily as needed.  Marland Kitchen aluminum chloride (DRYSOL) 20 % external solution Apply topically at bedtime.  Marland Kitchen aspirin 81 MG tablet Take 81 mg by mouth daily.  . Cholecalciferol (VITAMIN D-3 PO) Take 3,000 Units by mouth daily.   . Coenzyme Q10 (CO Q-10 PO) Take by mouth daily.  . cyclobenzaprine (FLEXERIL) 10 MG tablet Take 1 tablet (10 mg total) by mouth 3 (three) times daily as needed for muscle spasms.  Marland Kitchen EPIPEN 2-PAK 0.3 MG/0.3ML SOAJ injection as needed.   . fexofenadine (ALLEGRA) 180 MG tablet 1 tablet  . glucosamine-chondroitin 500-400 MG tablet Take 1 tablet by mouth 2 (two) times daily.  . Magnesium 250 MG TABS Take by mouth daily.   . Methylcellulose, Laxative, (FIBER THERAPY PO) Take 1 tablet by mouth daily.  . Multiple Vitamins-Minerals (MULTIVITAMIN PO) Take by mouth daily.  Marland Kitchen nystatin (NYSTATIN) powder Apply topically 3 (three) times daily as needed.  . nystatin cream (MYCOSTATIN) Apply 1 application topically 2 (two) times daily.  . Olopatadine HCl 0.6 % SOLN U 2 SPRAYS  IEN BID  . omeprazole (PRILOSEC) 20 MG capsule Take 20 mg by mouth daily.  . Probiotic Product (PROBIOTIC COLON SUPPORT PO) Take by mouth. Take one pill every other day  . valACYclovir (VALTREX) 1000 MG tablet Take one tablet twice daily at the first sign of a flare for about 3-5 days  . vitamin B-12 (CYANOCOBALAMIN) 1000 MCG tablet Take 1,000 mcg by mouth daily.  . vitamin E 1000 UNIT capsule Take 1,000 Units by mouth daily.  . Azelastine HCl 137 MCG/SPRAY SOLN 2 sprays each nostril  . cetirizine (ZYRTEC) 10 MG tablet Take 10 mg by mouth daily. (Patient not taking: Reported on 01/25/2021)  .  ketotifen (ZADITOR) 0.025 % ophthalmic solution 1 drop into affected eye   No current facility-administered medications on file prior to visit.    Allergies:  No Known Allergies  Social History:  Social History   Socioeconomic History  . Marital status: Single    Spouse name: Not on file  . Number of children: Not on file  . Years of education: Not on file  . Highest education level: 12th grade  Occupational History  . Occupation: retired  Tobacco Use  . Smoking status: Never Smoker  . Smokeless tobacco: Never Used  Vaping Use  . Vaping Use: Never used  Substance and Sexual Activity  . Alcohol use: No  . Drug use: No  . Sexual activity: Not Currently  Other Topics Concern  . Not on file  Social History Narrative  . Not on file   Social Determinants of Health   Financial Resource Strain: Low Risk   . Difficulty of Paying Living Expenses: Not hard at all  Food Insecurity: No Food Insecurity  . Worried About Charity fundraiser in the Last Year: Never true  . Ran Out of Food in the Last Year: Never true  Transportation Needs: No Transportation Needs  . Lack of Transportation (Medical): No  . Lack of Transportation (Non-Medical): No  Physical Activity: Inactive  . Days of Exercise per Week: 0 days  . Minutes of Exercise per Session: 0 min  Stress: No Stress Concern Present  .  Feeling of Stress : Not at all  Social Connections: Not on file  Intimate Partner Violence: Not on file   Social History   Tobacco Use  Smoking Status Never Smoker  Smokeless Tobacco Never Used   Social History   Substance and Sexual Activity  Alcohol Use No    Family History:  Family History  Problem Relation Age of Onset  . Hypertension Mother   . Hyperlipidemia Mother   . Diabetes Mother   . Cancer Mother        colon (benign)  . Glaucoma Mother   . Heart disease Father   . Hypertension Father   . Hyperlipidemia Father   . Diabetes Father   . Diabetes Sister   . Lung disease Sister        polyps  . Cancer Brother        kidney  . Glaucoma Brother   . Hyperlipidemia Brother   . Hypertension Brother   . Stroke Maternal Grandfather   . Diabetes Paternal Grandmother   . Heart disease Paternal Grandfather   . Cancer Maternal Aunt 50       ovarian  . Colon cancer Neg Hx   . Esophageal cancer Neg Hx   . Rectal cancer Neg Hx   . Stomach cancer Neg Hx     Past medical history, surgical history, medications, allergies, family history and social history reviewed with patient today and changes made to appropriate areas of the chart.   Review of Systems - negative All other ROS negative except what is listed above and in the HPI.      Objective:    BP 133/80   Pulse 73   Temp 98 F (36.7 C) (Oral)   Ht 5' 3.7" (1.618 m)   Wt 244 lb 12.8 oz (111 kg)   LMP  (LMP Unknown)   SpO2 98%   BMI 42.42 kg/m   Wt Readings from Last 3 Encounters:  01/25/21 244 lb 12.8 oz (111 kg)  09/14/20 217  lb (98.4 kg)  08/11/20 222 lb (100.7 kg)    Physical Exam Constitutional:      General: She is awake. She is not in acute distress.    Appearance: She is well-developed. She is not ill-appearing.  HENT:     Head: Normocephalic and atraumatic.     Right Ear: Hearing, tympanic membrane, ear canal and external ear normal. No drainage.     Left Ear: Hearing, tympanic membrane,  ear canal and external ear normal. No drainage.     Nose: Nose normal.     Right Sinus: No maxillary sinus tenderness or frontal sinus tenderness.     Left Sinus: No maxillary sinus tenderness or frontal sinus tenderness.     Mouth/Throat:     Mouth: Mucous membranes are moist.     Pharynx: Oropharynx is clear. Uvula midline. No pharyngeal swelling, oropharyngeal exudate or posterior oropharyngeal erythema.  Eyes:     General: Lids are normal.        Right eye: No discharge.        Left eye: No discharge.     Extraocular Movements: Extraocular movements intact.     Conjunctiva/sclera: Conjunctivae normal.     Pupils: Pupils are equal, round, and reactive to light.     Visual Fields: Right eye visual fields normal and left eye visual fields normal.  Neck:     Thyroid: No thyromegaly.     Vascular: No carotid bruit.     Trachea: Trachea normal.  Cardiovascular:     Rate and Rhythm: Normal rate and regular rhythm.     Heart sounds: Normal heart sounds. No murmur heard. No gallop.   Pulmonary:     Effort: Pulmonary effort is normal. No accessory muscle usage or respiratory distress.     Breath sounds: Normal breath sounds.  Chest:  Breasts:     Right: Normal. No axillary adenopathy or supraclavicular adenopathy.     Left: Normal. No axillary adenopathy or supraclavicular adenopathy.    Abdominal:     General: Bowel sounds are normal.     Palpations: Abdomen is soft. There is no hepatomegaly or splenomegaly.     Tenderness: There is no abdominal tenderness.  Musculoskeletal:        General: Normal range of motion.     Cervical back: Normal range of motion and neck supple.     Right lower leg: No edema.     Left lower leg: No edema.  Lymphadenopathy:     Head:     Right side of head: No submental, submandibular, tonsillar, preauricular or posterior auricular adenopathy.     Left side of head: No submental, submandibular, tonsillar, preauricular or posterior auricular adenopathy.      Cervical: No cervical adenopathy.     Upper Body:     Right upper body: No supraclavicular, axillary or pectoral adenopathy.     Left upper body: No supraclavicular, axillary or pectoral adenopathy.  Skin:    General: Skin is warm and dry.     Capillary Refill: Capillary refill takes less than 2 seconds.     Findings: No rash.  Neurological:     Mental Status: She is alert and oriented to person, place, and time.     Cranial Nerves: Cranial nerves are intact.     Gait: Gait is intact.     Deep Tendon Reflexes: Reflexes are normal and symmetric.     Reflex Scores:      Brachioradialis reflexes are 2+ on the  right side and 2+ on the left side.      Patellar reflexes are 2+ on the right side and 2+ on the left side. Psychiatric:        Attention and Perception: Attention normal.        Mood and Affect: Mood normal.        Speech: Speech normal.        Behavior: Behavior normal. Behavior is cooperative.        Thought Content: Thought content normal.        Judgment: Judgment normal.    Diabetic Foot Exam - Simple   Simple Foot Form Visual Inspection No deformities, no ulcerations, no other skin breakdown bilaterally: Yes Sensation Testing Intact to touch and monofilament testing bilaterally: Yes Pulse Check Posterior Tibialis and Dorsalis pulse intact bilaterally: Yes Comments    Results for orders placed or performed in visit on 01/02/21  Novel Coronavirus, NAA ([facility])   Specimen: Nasopharyngeal(NP) swabs in vial transport medium   Nasopharynge  Screenin  Result Value Ref Range   SARS-CoV-2, NAA Detected (A) Not Detected  SARS-COV-2, NAA 2 DAY TAT   Nasopharynge  Screenin  Result Value Ref Range   SARS-CoV-2, NAA 2 DAY TAT Performed       Assessment & Plan:   Problem List Items Addressed This Visit      Cardiovascular and Mediastinum   Benign hypertension    Chronic, ongoing with BP at goal today.  Recommend she monitor BP at least a few mornings a week at  home and document.  DASH diet at home.  Continue current medication regimen and adjust as needed.  Labs today: CMP, CBC, TSH.  Refills sent.  Return in 6 months.       Relevant Medications   atorvastatin (LIPITOR) 80 MG tablet   hydrochlorothiazide (HYDRODIURIL) 25 MG tablet   quinapril (ACCUPRIL) 40 MG tablet   Other Relevant Orders   CBC with Differential/Platelet   Comprehensive metabolic panel   TSH     Respiratory   Sleep apnea    Chronic, stable, continue 100% use of CPAP at home.        Digestive   GERD (gastroesophageal reflux disease)    Chronic, stable.  Continue current PPI dose and consider GDR in upcoming months.  Mag level today.      Relevant Orders   Magnesium     Musculoskeletal and Integument   Osteopenia    Chronic, continue daily supplements.  DEXA repeat in 2024.  Vitamin D level today.      Relevant Orders   VITAMIN D 25 Hydroxy (Vit-D Deficiency, Fractures)     Other   Prediabetes    Noted on recent labs. September A1c 5.8%. Recommend heavy focus on diet and exercise regimen to prevent from entering Type 2 Diabetes levels of 6.5% or greater.  A1c check today.      Hyperlipemia    Chronic, stable.  Continue current medication regimen and adjust as needed.  Lipid panel today.      Relevant Medications   atorvastatin (LIPITOR) 80 MG tablet   hydrochlorothiazide (HYDRODIURIL) 25 MG tablet   quinapril (ACCUPRIL) 40 MG tablet   Other Relevant Orders   Lipid Panel w/o Chol/HDL Ratio   Obesity    BMI 42.42.  Recommended eating smaller high protein, low fat meals more frequently and exercising 30 mins a day 5 times a week with a goal of 10-15lb weight loss in the next 3 months. Patient voiced  their understanding and motivation to adhere to these recommendations.  She is going to assess whether Plenity would be covered by insurance, could consider trial of this if so.        Depression, major, single episode, severe ([place]) - Primary    Chronic,  ongoing.  Continue current medication regimen, Wellbutrin 300 MG daily.  PHQ9 today = 0.  Denies SI/HI.  She does not wish to adjust anything at this time.  Return in 6 months.  Refills sent in.      Relevant Medications   buPROPion (WELLBUTRIN XL) 300 MG 24 hr tablet   Memory changes    Has concern for memory changes, current 6CIT is remaining 2.  No changes.  Neuro exam normal today.  Recommend to continue to monitor and work on memory exercises at home.  May trial Prevagen at home.  Return for worsening or ongoing symptoms, could consider further lab work-up and imaging if worsening.       Other Visit Diagnoses    Encounter for screening mammogram for malignant neoplasm of breast       Mammogram ordered   Relevant Orders   MM DIGITAL SCREENING BILATERAL   HgB A1c   Annual physical exam       Annual labs today to include CBC, CMP, TSH, lipid.  Health maintenance reviewed.       Follow up plan: Return in about 6 months (around 07/25/2021) for Prediabetes, HTN/HLD, MOOD.   LABORATORY TESTING:  - Pap smear: not applicable  IMMUNIZATIONS:   - Tdap: Tetanus vaccination status reviewed: last tetanus booster within 10 years. - Influenza: Up to date - Pneumovax: Up to date - Prevnar: Up to date - HPV: Not applicable - Zostavax vaccine: shingle vaccine in 2015 -- had one shingrix at [place]: -Mammogram: Ordered today  - Colonoscopy: Up to date  - Bone Density: Up to date -- repeat in 2024 -Hearing Test: Not applicable  -Spirometry: Not applicable   PATIENT COUNSELING:   Advised to take 1 mg of folate supplement per day if capable of pregnancy.   Sexuality: Discussed sexually transmitted diseases, partner selection, use of condoms, avoidance of unintended pregnancy  and contraceptive alternatives.   Advised to avoid cigarette smoking.  I discussed with the patient that most people either abstain from alcohol or drink within safe limits (<=14/week and <=4  drinks/occasion for males, <=7/weeks and <= 3 drinks/occasion for females) and that the risk for alcohol disorders and other health effects rises proportionally with the number of drinks per week and how often a drinker exceeds daily limits.  Discussed cessation/primary prevention of drug use and availability of treatment for abuse.   Diet: Encouraged to adjust caloric intake to maintain  or achieve ideal body weight, to reduce intake of dietary saturated fat and total fat, to limit sodium intake by avoiding high sodium foods and not adding table salt, and to maintain adequate dietary potassium and calcium preferably from fresh fruits, vegetables, and low-fat dairy products.    Stressed the importance of regular exercise  Injury prevention: Discussed safety belts, safety helmets, smoke detector, smoking near bedding or upholstery.   Dental health: Discussed importance of regular tooth brushing, flossing, and dental visits.    NEXT PREVENTATIVE PHYSICAL DUE IN 1 YEAR. Return in about 6 months (around 07/25/2021) for Prediabetes, HTN/HLD, MOOD.

## 2021-01-25 NOTE — Patient Instructions (Signed)
Prediabetes Eating Plan Prediabetes is a condition that causes blood sugar (glucose) levels to be higher than normal. This increases the risk for developing type 2 diabetes (type 2 diabetes mellitus). Working with a health care provider or nutrition specialist (dietitian) to make diet and lifestyle changes can help prevent the onset of diabetes. These changes may help you:  Control your blood glucose levels.  Improve your cholesterol levels.  Manage your blood pressure. What are tips for following this plan? Reading food labels  Read food labels to check the amount of fat, salt (sodium), and sugar in prepackaged foods. Avoid foods that have: ? Saturated fats. ? Trans fats. ? Added sugars.  Avoid foods that have more than 300 milligrams (mg) of sodium per serving. Limit your sodium intake to less than 2,300 mg each day. Shopping  Avoid buying pre-made and processed foods.  Avoid buying drinks with added sugar. Cooking  Cook with olive oil. Do not use butter, lard, or ghee.  Bake, broil, grill, steam, or boil foods. Avoid frying. Meal planning  Work with your dietitian to create an eating plan that is right for you. This may include tracking how many calories you take in each day. Use a food diary, notebook, or mobile application to track what you eat at each meal.  Consider following a Mediterranean diet. This includes: ? Eating several servings of fresh fruits and vegetables each day. ? Eating fish at least twice a week. ? Eating one serving each day of whole grains, beans, nuts, and seeds. ? Using olive oil instead of other fats. ? Limiting alcohol. ? Limiting red meat. ? Using nonfat or low-fat dairy products.  Consider following a plant-based diet. This includes dietary choices that focus on eating mostly vegetables and fruit, grains, beans, nuts, and seeds.  If you have high blood pressure, you may need to limit your sodium intake or follow a diet such as the DASH  (Dietary Approaches to Stop Hypertension) eating plan. The DASH diet aims to lower high blood pressure.   Lifestyle  Set weight loss goals with help from your health care team. It is recommended that most people with prediabetes lose 7% of their body weight.  Exercise for at least 30 minutes 5 or more days a week.  Attend a support group or seek support from a mental health counselor.  Take over-the-counter and prescription medicines only as told by your health care provider. What foods are recommended? Fruits Berries. Bananas. Apples. Oranges. Grapes. Papaya. Mango. Pomegranate. Kiwi. Grapefruit. Cherries. Vegetables Lettuce. Spinach. Peas. Beets. Cauliflower. Cabbage. Broccoli. Carrots. Tomatoes. Squash. Eggplant. Herbs. Peppers. Onions. Cucumbers. Brussels sprouts. Grains Whole grains, such as whole-wheat or whole-grain breads, crackers, cereals, and pasta. Unsweetened oatmeal. Bulgur. Barley. Quinoa. Brown rice. Corn or whole-wheat flour tortillas or taco shells. Meats and other proteins Seafood. Poultry without skin. Lean cuts of pork and beef. Tofu. Eggs. Nuts. Beans. Dairy Low-fat or fat-free dairy products, such as yogurt, cottage cheese, and cheese. Beverages Water. Tea. Coffee. Sugar-free or diet soda. Seltzer water. Low-fat or nonfat milk. Milk alternatives, such as soy or almond milk. Fats and oils Olive oil. Canola oil. Sunflower oil. Grapeseed oil. Avocado. Walnuts. Sweets and desserts Sugar-free or low-fat pudding. Sugar-free or low-fat ice cream and other frozen treats. Seasonings and condiments Herbs. Sodium-free spices. Mustard. Relish. Low-salt, low-sugar ketchup. Low-salt, low-sugar barbecue sauce. Low-fat or fat-free mayonnaise. The items listed above may not be a complete list of recommended foods and beverages. Contact a dietitian for more   information. What foods are not recommended? Fruits Fruits canned with syrup. Vegetables Canned vegetables. Frozen  vegetables with butter or cream sauce. Grains Refined white flour and flour products, such as bread, pasta, snack foods, and cereals. Meats and other proteins Fatty cuts of meat. Poultry with skin. Breaded or fried meat. Processed meats. Dairy Full-fat yogurt, cheese, or milk. Beverages Sweetened drinks, such as iced tea and soda. Fats and oils Butter. Lard. Ghee. Sweets and desserts Baked goods, such as cake, cupcakes, pastries, cookies, and cheesecake. Seasonings and condiments Spice mixes with added salt. Ketchup. Barbecue sauce. Mayonnaise. The items listed above may not be a complete list of foods and beverages that are not recommended. Contact a dietitian for more information. Where to find more information  American Diabetes Association: www.diabetes.org Summary  You may need to make diet and lifestyle changes to help prevent the onset of diabetes. These changes can help you control blood sugar, improve cholesterol levels, and manage blood pressure.  Set weight loss goals with help from your health care team. It is recommended that most people with prediabetes lose 7% of their body weight.  Consider following a Mediterranean diet. This includes eating plenty of fresh fruits and vegetables, whole grains, beans, nuts, seeds, fish, and low-fat dairy, and using olive oil instead of other fats. This information is not intended to replace advice given to you by your health care provider. Make sure you discuss any questions you have with your health care provider. Document Revised: 03/09/2020 Document Reviewed: 03/09/2020 Elsevier Patient Education  2021 [employer]  

## 2021-01-25 NOTE — Assessment & Plan Note (Signed)
Chronic, ongoing with BP at goal today.  Recommend she monitor BP at least a few mornings a week at home and document.  DASH diet at home.  Continue current medication regimen and adjust as needed.  Labs today: CMP, CBC, TSH.  Refills sent.  Return in 6 months.

## 2021-01-25 NOTE — Assessment & Plan Note (Signed)
Chronic, stable, continue 100% use of CPAP at home.

## 2021-01-25 NOTE — Assessment & Plan Note (Signed)
BMI 42.42.  Recommended eating smaller high protein, low fat meals more frequently and exercising 30 mins a day 5 times a week with a goal of 10-15lb weight loss in the next 3 months. Patient voiced their understanding and motivation to adhere to these recommendations.  She is going to assess whether Plenity would be covered by insurance, could consider trial of this if so.

## 2021-01-25 NOTE — Assessment & Plan Note (Signed)
Chronic, stable.  Continue current PPI dose and consider GDR in upcoming months.  Mag level today.

## 2021-01-25 NOTE — Assessment & Plan Note (Addendum)
Noted on recent labs. September A1c 5.8%. Recommend heavy focus on diet and exercise regimen to prevent from entering Type 2 Diabetes levels of 6.5% or greater.  A1c check today.

## 2021-01-26 LAB — COMPREHENSIVE METABOLIC PANEL
ALT: 36 IU/L — ABNORMAL HIGH (ref 0–32)
AST: 25 IU/L (ref 0–40)
Albumin/Globulin Ratio: 1.9 (ref 1.2–2.2)
Albumin: 4.3 g/dL (ref 3.8–4.8)
Alkaline Phosphatase: 105 IU/L (ref 44–121)
BUN/Creatinine Ratio: 12 (ref 12–28)
BUN: 11 mg/dL (ref 8–27)
Bilirubin Total: 0.5 mg/dL (ref 0.0–1.2)
CO2: 24 mmol/L (ref 20–29)
Calcium: 10.6 mg/dL — ABNORMAL HIGH (ref 8.7–10.3)
Chloride: 102 mmol/L (ref 96–106)
Creatinine, Ser: 0.89 mg/dL (ref 0.57–1.00)
GFR calc Af Amer: 77 mL/min/{1.73_m2} (ref >59)
GFR calc non Af Amer: 67 mL/min/{1.73_m2} (ref >59)
Globulin, Total: 2.3 g/dL (ref 1.5–4.5)
Glucose: 128 mg/dL — ABNORMAL HIGH (ref 65–99)
Potassium: 4.1 mmol/L (ref 3.5–5.2)
Sodium: 140 mmol/L (ref 134–144)
Total Protein: 6.6 g/dL (ref 6.0–8.5)

## 2021-01-26 LAB — LIPID PANEL W/O CHOL/HDL RATIO
Cholesterol, Total: 172 mg/dL (ref 100–199)
HDL: 41 mg/dL (ref >39)
LDL Chol Calc (NIH): 94 mg/dL (ref 0–99)
Triglycerides: 218 mg/dL — ABNORMAL HIGH (ref 0–149)
VLDL Cholesterol Cal: 37 mg/dL (ref 5–40)

## 2021-01-26 LAB — CBC WITH DIFFERENTIAL/PLATELET
Basophils Absolute: 0 10*3/uL (ref 0.0–0.2)
Basos: 0 %
EOS (ABSOLUTE): 0.1 10*3/uL (ref 0.0–0.4)
Eos: 1 %
Hematocrit: 45.9 % (ref 34.0–46.6)
Hemoglobin: 14.9 g/dL (ref 11.1–15.9)
Immature Grans (Abs): 0 10*3/uL (ref 0.0–0.1)
Immature Granulocytes: 0 %
Lymphocytes Absolute: 2.4 10*3/uL (ref 0.7–3.1)
Lymphs: 30 %
MCH: 29.3 pg (ref 26.6–33.0)
MCHC: 32.5 g/dL (ref 31.5–35.7)
MCV: 90 fL (ref 79–97)
Monocytes Absolute: 0.5 10*3/uL (ref 0.1–0.9)
Monocytes: 6 %
Neutrophils Absolute: 4.9 10*3/uL (ref 1.4–7.0)
Neutrophils: 63 %
Platelets: 269 10*3/uL (ref 150–450)
RBC: 5.09 x10E6/uL (ref 3.77–5.28)
RDW: 12.3 % (ref 11.7–15.4)
WBC: 7.9 10*3/uL (ref 3.4–10.8)

## 2021-01-26 LAB — TSH: TSH: 2.92 u[IU]/mL (ref 0.450–4.500)

## 2021-01-26 LAB — VITAMIN D 25 HYDROXY (VIT D DEFICIENCY, FRACTURES): Vit D, 25-Hydroxy: 26.5 ng/mL — ABNORMAL LOW (ref 30.0–100.0)

## 2021-01-26 LAB — MAGNESIUM: Magnesium: 2 mg/dL (ref 1.6–2.3)

## 2021-01-26 LAB — HEMOGLOBIN A1C
Est. average glucose Bld gHb Est-mCnc: 131 mg/dL
Hgb A1c MFr Bld: 6.2 % — ABNORMAL HIGH (ref 4.8–5.6)

## 2021-01-26 NOTE — Progress Notes (Signed)
Contacted via MyChart  Good evening Barbara Fitzpatrick, your labs have returned: - CBC shows no anemia. Thyroid lab normal. - Kidney function (creatinine and GFR) is normal.  Calcium mildly elevated, we will continue to monitor this.  Liver function shows mild elevation in ALT, but normal AST.   - Cholesterol levels continue to show LDL above goal for stroke prevention, at 94.  I would recommend adding on Zetia to regimen, which can work alongside your high dose Atorvastatin and hopefully help lower levels further. - Vitamin D is mildly low at 26.5, I would recommend increasing your daily supplement to 4000 units. - The A1C is the diabetes testing we talked about, this looks at your blood sugars over the past 3 months and turns the average into a number.  Your number is 6.2%, meaning you are prediabetic.  Any number 5.7 to 6.4 is considered prediabetes and any number 6.5 or greater is considered diabetes.   I would recommend heavy focus on decreasing foods high in sugar and your intake of things like bread products, pasta, and rice.  The American Diabetes Association online has a large amount of information on diet changes to make.  We will recheck this number in 3 months to ensure you are not continuing to trend upwards and move into diabetes.  Any questions for me? Keep being awesome!!  Thank you for allowing me to participate in your care. Kindest regards, Cierah Crader

## 2021-01-31 ENCOUNTER — Other Ambulatory Visit: Payer: Self-pay | Admitting: Nurse Practitioner

## 2021-01-31 MED ORDER — EZETIMIBE 10 MG PO TABS: 10.0000 mg | ORAL_TABLET | Freq: Every day | ORAL | 3 refills | Status: DC

## 2021-02-07 ENCOUNTER — Telehealth: Payer: Self-pay

## 2021-02-07 NOTE — Telephone Encounter (Signed)
Copied from [place] [id number]. Topic: General - Other >> Feb 07, 2021 10:16 AM Oneta Rack wrote: Caller name: Erin  Relation to pt: from [facility] [facility] Call back number: [phone]    Reason for call:  In need of clarity regarding aspirin 81 mg, patient is having 2 teeth abstracted and guidelines regarding aspirin is unclear, please advise. Should the patient stay on the aspirin or d/c , please advise

## 2021-02-07 NOTE — Telephone Encounter (Signed)
Called back, office is aware of verbal orders. States the first fax back they received did not have clear instructions. They need to have it in writing so they will refax another form.

## 2021-02-07 NOTE — Telephone Encounter (Signed)
Please advise 

## 2021-02-07 NOTE — Telephone Encounter (Signed)
She is to stop ASA 3 days prior to procedure and restart 3 days after.

## 2021-02-16 ENCOUNTER — Other Ambulatory Visit: Payer: Self-pay

## 2021-02-16 ENCOUNTER — Ambulatory Visit
Admission: RE | Admit: 2021-02-16 | Discharge: 2021-02-16 | Disposition: A | Discharge status: Home / Self Care | Payer: Medicare PPO | Source: Ambulatory Visit | Attending: Nurse Practitioner | Admitting: Nurse Practitioner

## 2021-02-16 DIAGNOSIS — Z1231 Encounter for screening mammogram for malignant neoplasm of breast: Secondary | ICD-10-CM | POA: Diagnosis present

## 2021-04-16 ENCOUNTER — Other Ambulatory Visit: Payer: Self-pay | Admitting: Nurse Practitioner

## 2021-04-16 ENCOUNTER — Telehealth: Payer: Self-pay

## 2021-04-16 DIAGNOSIS — R7303 Prediabetes: Secondary | ICD-10-CM

## 2021-04-16 DIAGNOSIS — E559 Vitamin D deficiency, unspecified: Secondary | ICD-10-CM

## 2021-04-16 MED ORDER — QUINAPRIL HCL 40 MG PO TABS: ORAL_TABLET | ORAL | 4 refills | Status: DC

## 2021-04-16 NOTE — Telephone Encounter (Signed)
Appt scheduled

## 2021-04-16 NOTE — Telephone Encounter (Signed)
Copied from [place] [id number]. Topic: General - Other >> Apr 16, 2021 11:49 AM Keene Breath wrote: Reason for CRM: Patient called to ask if the doctor would put in the lab work they discussed at her earlier appt.  She stated that the doctor gave her a new script and said she would need labs after 3 months of taking the medication.  Please advise and call to discuss at [phone]  Can lab orders be placed?

## 2021-04-16 NOTE — Telephone Encounter (Signed)
Lab order in, needs lab only visit scheduled

## 2021-04-16 NOTE — Telephone Encounter (Signed)
Please see message below

## 2021-04-16 NOTE — Telephone Encounter (Signed)
Medication Refill - Medication: quinapril (ACCUPRIL) 40 MG tablet     Preferred Pharmacy (with phone number or street name):  [id number] [address], [place] Phone:  [phone]  Fax:  [phone]       Agent: Please be advised that RX refills may take up to 3 business days. We ask that you follow-up with your pharmacy.

## 2021-04-23 ENCOUNTER — Other Ambulatory Visit: Payer: Medicare PPO

## 2021-04-23 ENCOUNTER — Other Ambulatory Visit: Payer: Self-pay

## 2021-04-23 DIAGNOSIS — E782 Mixed hyperlipidemia: Secondary | ICD-10-CM

## 2021-04-23 DIAGNOSIS — E559 Vitamin D deficiency, unspecified: Secondary | ICD-10-CM

## 2021-04-23 DIAGNOSIS — R7303 Prediabetes: Secondary | ICD-10-CM

## 2021-04-23 LAB — BAYER DCA HB A1C WAIVED: HB A1C (BAYER DCA - WAIVED): 6.1 % (ref <7.0)

## 2021-04-23 NOTE — Addendum Note (Signed)
Addended by: Rudie Meyer on: 04/23/2021 10:48 AM   Modules accepted: Orders

## 2021-04-24 LAB — LIPID PANEL W/O CHOL/HDL RATIO
Cholesterol, Total: 141 mg/dL (ref 100–199)
HDL: 41 mg/dL (ref >39)
LDL Chol Calc (NIH): 72 mg/dL (ref 0–99)
Triglycerides: 165 mg/dL — ABNORMAL HIGH (ref 0–149)
VLDL Cholesterol Cal: 28 mg/dL (ref 5–40)

## 2021-04-24 LAB — VITAMIN D 25 HYDROXY (VIT D DEFICIENCY, FRACTURES): Vit D, 25-Hydroxy: 40.1 ng/mL (ref 30.0–100.0)

## 2021-04-24 NOTE — Progress Notes (Signed)
Contacted via [place] afternoon Barbara Fitzpatrick, your Vitamin D level has improved and cholesterol levels are better.  Continue all current medications and we will continue to monitor at visits.  Great job!!   Keep being awesome!!  Thank you for allowing me to participate in your care. Kindest regards, Brazil Voytko

## 2021-04-26 ENCOUNTER — Other Ambulatory Visit: Payer: Self-pay | Admitting: Nurse Practitioner

## 2021-04-26 MED ORDER — ATORVASTATIN CALCIUM 80 MG PO TABS: 80.0000 mg | ORAL_TABLET | Freq: Every day | ORAL | 4 refills | Status: DC

## 2021-06-18 ENCOUNTER — Encounter: Payer: Self-pay | Admitting: Nurse Practitioner

## 2021-06-28 DIAGNOSIS — J3089 Other allergic rhinitis: Secondary | ICD-10-CM | POA: Diagnosis not present

## 2021-07-05 DIAGNOSIS — J3089 Other allergic rhinitis: Secondary | ICD-10-CM | POA: Diagnosis not present

## 2021-07-12 DIAGNOSIS — J3089 Other allergic rhinitis: Secondary | ICD-10-CM | POA: Diagnosis not present

## 2021-07-24 DIAGNOSIS — J3089 Other allergic rhinitis: Secondary | ICD-10-CM | POA: Diagnosis not present

## 2021-07-27 ENCOUNTER — Ambulatory Visit: Payer: Medicare PPO | Admitting: Nurse Practitioner

## 2021-07-28 ENCOUNTER — Other Ambulatory Visit: Payer: Self-pay | Admitting: Nurse Practitioner

## 2021-08-07 ENCOUNTER — Other Ambulatory Visit: Payer: Self-pay

## 2021-08-07 ENCOUNTER — Ambulatory Visit: Payer: Medicare PPO | Admitting: Nurse Practitioner

## 2021-08-07 ENCOUNTER — Encounter: Payer: Self-pay | Admitting: Nurse Practitioner

## 2021-08-07 VITALS — BP 128/78 | HR 80 | Temp 98.2°F | Wt 243.0 lb

## 2021-08-07 DIAGNOSIS — I10 Essential (primary) hypertension: Secondary | ICD-10-CM

## 2021-08-07 DIAGNOSIS — F322 Major depressive disorder, single episode, severe without psychotic features: Secondary | ICD-10-CM

## 2021-08-07 DIAGNOSIS — G4733 Obstructive sleep apnea (adult) (pediatric): Secondary | ICD-10-CM

## 2021-08-07 DIAGNOSIS — Z6841 Body Mass Index (BMI) 40.0 and over, adult: Secondary | ICD-10-CM | POA: Diagnosis not present

## 2021-08-07 DIAGNOSIS — R7303 Prediabetes: Secondary | ICD-10-CM | POA: Diagnosis not present

## 2021-08-07 DIAGNOSIS — E782 Mixed hyperlipidemia: Secondary | ICD-10-CM | POA: Diagnosis not present

## 2021-08-07 NOTE — Assessment & Plan Note (Signed)
Chronic, stable.  Continue current medication regimen and adjust as needed.  Lipid panel today. 

## 2021-08-07 NOTE — Assessment & Plan Note (Signed)
BMI 42.10.  Recommended eating smaller high protein, low fat meals more frequently and exercising 30 mins a day 5 times a week with a goal of 10-15lb weight loss in the next 3 months. Patient voiced their understanding and motivation to adhere to these recommendations.  Consider Wegovy or Ozempic if A1c remains prediabetic.

## 2021-08-07 NOTE — Assessment & Plan Note (Signed)
Chronic, ongoing with BP at goal today.  Recommend she monitor BP at least a few mornings a week at home and document.  DASH diet at home.  Continue current medication regimen and adjust as needed.  Labs today: CMP.  Return in 6 months.

## 2021-08-07 NOTE — Patient Instructions (Signed)
Suicidal Feelings: How to Help Yourself Suicide is when you end your own life. There are many things you can do to help yourself feel better when struggling with these feelings. Many services and people are available to support you and others who struggle with similar feelings.  If you ever feel like you may hurt yourself or others, or have thoughts about taking your own life, get help right away. To get help: Call your local emergency services (911 in the [place] The [employer]'s health and human services helpline (211 in the [place] Go to your nearest emergency department. Call a suicide hotline to speak with a trained counselor. The following suicide hotlines are available in the [place]: 1-800-273-TALK (1-800-273-8255). 1-800-SUICIDE (1-800-784-2433). 1-888-628-9454. This is a hotline for Spanish speakers. 1-800-799-4889. This is a hotline for TTY users. 1-866-4-U-TREVOR (1-866-488-7386). This is a hotline for lesbian, gay, bisexual, transgender, or questioning youth. For a list of hotlines in [place], visit www.suicide.org/hotlines/international/[place]-suicide-hotlines.html Contact a crisis center or a local suicide prevention center. To find a crisis center or suicide prevention center: Call your local hospital, clinic, community service organization, mental health center, social service provider, or health department. Ask for help with connecting to a crisis center. For a list of crisis centers in the [place], visit: suicidepreventionlifeline.org For a list of crisis centers in [place], visit: suicideprevention.ca How to help yourself feel better  Promise yourself that you will not do anything extreme when you have suicidal feelings. Remember the times you have felt hopeful. Many people have gotten through suicidal thoughts and feelings, and you can too. If you have had these feelings before, remind yourself that you can get through them again. Let family, friends, teachers, or  counselors know how you are feeling. Try not to separate yourself from those who care about you and want to help you. Talk with someone every day, even if you do not feel sociable. Face-to-face conversation is best to help them understand your feelings. Contact a mental health care provider and work with this person regularly. Make a safety plan that you can follow during a crisis. Include phone numbers of suicide prevention hotlines, mental health professionals, and trusted friends and family members you can call during an emergency. Save these numbers on your phone. If you are thinking of taking a lot of medicine, give your medicine to someone who can give it to you as prescribed. If you are on antidepressants and are concerned you will overdose, tell your health care provider so that he or she can give you safer medicines. Try to stick to your routines and follow a schedule every day. Make self-care a priority. Make a list of realistic goals, and cross them off when you achieve them. Accomplishments can give you a sense of worth. Wait until you are feeling better before doing things that you find difficult or unpleasant. Do things that you have always enjoyed to take your mind off your feelings. Try reading a book, or listening to or playing music. Spending time outside, in nature, may help you feel better. Follow these instructions at home:  Visit your primary health care provider every year for a checkup. Work with a mental health care provider as needed. Eat a well-balanced diet, and eat regular meals. Get plenty of rest. Exercise if you are able. Just 30 minutes of exercise each day can help you feel better. Take over-the-counter and prescription medicines only as told by your health care provider. Ask your mental health care provider about   the possible side effects of any medicines you are taking. Do not use alcohol or drugs, and remove these substances from your home. Remove weapons,  poisons, knives, and other deadly items from your home. General recommendations Keep your living space well lit. When you are feeling well, write yourself a letter with tips and support that you can read when you are not feeling well. Remember that life's difficulties can be sorted out with help. Conditions can be treated, and you can learn behaviors and ways of thinking that will help you. Where to find more information National Suicide Prevention Lifeline: www.suicidepreventionlifeline.org Hopeline: www.hopeline.com American Foundation for Suicide Prevention: www.afsp.org The Trevor Project (for lesbian, gay, bisexual, transgender, or questioning youth): www.thetrevorproject.org National Institute of Mental Health: https://www.nimh.nih.gov/health/topics/suicide-prevention Contact a health care provider if: You feel as though you are a burden to others. You feel agitated, angry, vengeful, or have extreme mood swings. You have withdrawn from family and friends. Get help right away if: You are talking about suicide or wishing to die. You start making plans for how to commit suicide. You feel that you have no reason to live. You start making plans for putting your affairs in order, saying goodbye, or giving your possessions away. You feel guilt, shame, or unbearable pain, and it seems like there is no way out. You are frequently using drugs or alcohol. You are engaging in risky behaviors that could lead to death. If you have any of these symptoms, get help right away. Call emergency services, go to your nearest emergency department or crisis center, or call a suicide crisis helpline. Summary Suicide is when you take your own life. Promise yourself that you will not do anything extreme when you have suicidal feelings. Let family, friends, teachers, or counselors know how you are feeling. Get help right away if you start making plans for how to commit suicide. This information is not  intended to replace advice given to you by your health care provider. Make sure you discuss any questions you have with your health care provider. Document Revised: 08/23/2020 Document Reviewed: 08/25/2020 Elsevier Patient Education  2022 Elsevier Inc.  

## 2021-08-07 NOTE — Assessment & Plan Note (Signed)
Chronic, stable.  Recommend continue daily CPAP use.

## 2021-08-07 NOTE — Assessment & Plan Note (Signed)
Noted on recent labs. A1c 6.1% recent labs, recheck today. Recommend heavy focus on diet and exercise regimen to prevent from entering Type 2 Diabetes levels of 6.5% or greater.  Consider Wegovy or Ozempic if ongoing elevations to aide in weight loss.

## 2021-08-07 NOTE — Progress Notes (Signed)
BP 128/78 (BP Location: Left Arm)   Pulse 80   Temp 98.2 F (36.8 C) (Oral)   Wt 243 lb (110.2 kg)   LMP  (LMP Unknown)   SpO2 96%   BMI 42.10 kg/m    Subjective:    Patient ID: Barbara Fitzpatrick, female    DOB: Jun 24, 1951, 70 y.o.   MRN: [id number]  HPI: Barbara Fitzpatrick is a 70 y.o. female  Chief Complaint  Patient presents with   Hyperlipidemia   Hypertension   Mood    Prediabetic   HYPERTENSION/HLD Continues on Accupril 40 MG + HCTZ 25 MG & Lipitor 80 MG + Zetia.  Does have CPAP and uses 100% of the time.  Hypertension status: controlled  Satisfied with current treatment? yes Duration of hypertension: chronic BP monitoring frequency:  not checking BP range:  BP medication side effects:  no Medication compliance: good compliance Previous BP meds: none Aspirin: yes Recurrent headaches: none Visual changes: no Palpitations: no Dyspnea: no Chest pain: no Lower extremity edema: no Dizzy/lightheaded: no   IFG: Last A1C in May 6.1%.  Reports her diet is improving, less sweets. Polydipsia/polyuria: yes Visual disturbance: no Chest pain: no Paresthesias: no   DEPRESSION Reports good control with Wellbutrin. Mood status: controlled Satisfied with current treatment?: is satisfied with medicaiton Symptom severity: mild  Duration of current treatment : chronic Side effects: no Medication compliance: excellent compliance Psychotherapy/counseling: no never gone Previous psychiatric medications:none Depressed mood: no Anxious mood: no Anhedonia: no Significant weight loss or gain: yes Insomnia: no issues Fatigue: no Feelings of worthlessness or guilt: no Impaired concentration/indecisiveness: no Suicidal ideations: occasional, but no plans and no SI today Hopelessness: no Crying spells: none Depression screen [facility] 2/9 08/07/2021 01/25/2021 09/14/2020 08/11/2020 01/11/2020  Decreased Interest 0 0 0 0 3  Down, Depressed, Hopeless 1 0 0 0 2  PHQ - 2 Score 1 0 0 0 5   Altered sleeping 0 0 0 0 0  Tired, decreased energy 3 0 1 0 3  Change in appetite 3 0 0 0 3  Feeling bad or failure about yourself  1 0 0 0 0  Trouble concentrating 0 0 0 0 0  Moving slowly or fidgety/restless 0 0 1 0 2  Suicidal thoughts 1 0 0 0 0  PHQ-9 Score 9 0 2 0 13  Difficult doing work/chores Not difficult at all - Not difficult at all Not difficult at all -  Some recent data might be hidden     Relevant past medical, surgical, family and social history reviewed and updated as indicated. Interim medical history since our last visit reviewed. Allergies and medications reviewed and updated.  Review of Systems  Constitutional:  Negative for activity change, appetite change, diaphoresis, fatigue and fever.  Respiratory:  Negative for cough, chest tightness and shortness of breath.   Cardiovascular:  Negative for chest pain, palpitations and leg swelling.  Gastrointestinal: Negative.   Endocrine: Negative for polydipsia, polyphagia and polyuria.  Neurological: Negative.   Psychiatric/Behavioral: Negative.     Per HPI unless specifically indicated above     Objective:    BP 128/78 (BP Location: Left Arm)   Pulse 80   Temp 98.2 F (36.8 C) (Oral)   Wt 243 lb (110.2 kg)   LMP  (LMP Unknown)   SpO2 96%   BMI 42.10 kg/m   Wt Readings from Last 3 Encounters:  08/07/21 243 lb (110.2 kg)  01/25/21 244 lb 12.8 oz (111 kg)  09/14/20 217 lb (98.4 kg)    Physical Exam Vitals and nursing note reviewed.  Constitutional:      General: She is awake. She is not in acute distress.    Appearance: She is well-developed and well-groomed. She is obese. She is not ill-appearing or toxic-appearing.  HENT:     Head: Normocephalic.     Right Ear: Hearing, tympanic membrane, ear canal and external ear normal.     Left Ear: Hearing, tympanic membrane, ear canal and external ear normal.  Eyes:     General: Lids are normal.        Right eye: No discharge.        Left eye: No discharge.      Conjunctiva/sclera: Conjunctivae normal.     Pupils: Pupils are equal, round, and reactive to light.  Neck:     Thyroid: No thyromegaly.     Vascular: No carotid bruit.  Cardiovascular:     Rate and Rhythm: Normal rate and regular rhythm.     Heart sounds: Normal heart sounds. No murmur heard.   No gallop.  Pulmonary:     Effort: Pulmonary effort is normal. No accessory muscle usage or respiratory distress.     Breath sounds: Normal breath sounds.  Abdominal:     General: Bowel sounds are normal.     Palpations: Abdomen is soft. There is no hepatomegaly or splenomegaly.  Musculoskeletal:     Cervical back: Normal range of motion and neck supple.     Right lower leg: No edema.     Left lower leg: No edema.  Lymphadenopathy:     Cervical: No cervical adenopathy.  Skin:    General: Skin is warm and dry.  Neurological:     Mental Status: She is alert and oriented to person, place, and time.  Psychiatric:        Attention and Perception: Attention normal.        Mood and Affect: Mood normal.        Speech: Speech normal.        Behavior: Behavior normal. Behavior is cooperative.        Thought Content: Thought content normal.    Results for orders placed or performed in visit on 04/23/21  VITAMIN D 25 Hydroxy (Vit-D Deficiency, Fractures)  Result Value Ref Range   Vit D, 25-Hydroxy 40.1 30.0 - 100.0 ng/mL  Bayer DCA Hb A1c Waived  Result Value Ref Range   HB A1C (BAYER DCA - WAIVED) 6.1 <7.0 %  Lipid Panel w/o Chol/HDL Ratio  Result Value Ref Range   Cholesterol, Total 141 100 - 199 mg/dL   Triglycerides 165 (H) 0 - 149 mg/dL   HDL 41 >39 mg/dL   VLDL Cholesterol Cal 28 5 - 40 mg/dL   LDL Chol Calc (NIH) 72 0 - 99 mg/dL      Assessment & Plan:   Problem List Items Addressed This Visit       Cardiovascular and Mediastinum   Benign hypertension    Chronic, ongoing with BP at goal today.  Recommend she monitor BP at least a few mornings a week at home and  document.  DASH diet at home.  Continue current medication regimen and adjust as needed.  Labs today: CMP.  Return in 6 months.       Relevant Orders   Comprehensive metabolic panel     Respiratory   Sleep apnea    Chronic, stable.  Recommend continue daily CPAP use.  Other   Prediabetes    Noted on recent labs. A1c 6.1% recent labs, recheck today. Recommend heavy focus on diet and exercise regimen to prevent from entering Type 2 Diabetes levels of 6.5% or greater.  Consider Wegovy or Ozempic if ongoing elevations to aide in weight loss.      Relevant Orders   HgB A1c   Hyperlipemia    Chronic, stable.  Continue current medication regimen and adjust as needed.  Lipid panel today.      Relevant Orders   Lipid Panel w/o Chol/HDL Ratio   Obesity    BMI 42.10.  Recommended eating smaller high protein, low fat meals more frequently and exercising 30 mins a day 5 times a week with a goal of 10-15lb weight loss in the next 3 months. Patient voiced their understanding and motivation to adhere to these recommendations.  Consider Wegovy or Ozempic if A1c remains prediabetic.        Depression, major, single episode, severe ([place]) - Primary    Chronic, ongoing.  Continue current medication regimen, Wellbutrin 300 MG daily.  PHQ9 today = 9.  Denies SI/HI -- has occasional fleeting thoughts, is aware to notify provider immediately if SI presents or plan.  She does not wish to adjust anything at this time.  Return in 6 months.  Refills sent in.        Follow up plan: Return in about 6 months (around 02/07/2022) for Annual physical.

## 2021-08-07 NOTE — Assessment & Plan Note (Signed)
Chronic, ongoing.  Continue current medication regimen, Wellbutrin 300 MG daily.  PHQ9 today = 9.  Denies SI/HI -- has occasional fleeting thoughts, is aware to notify provider immediately if SI presents or plan.  She does not wish to adjust anything at this time.  Return in 6 months.  Refills sent in.

## 2021-08-10 NOTE — Progress Notes (Signed)
[facility] [place], [place] [address]  Pulmonary Sleep Medicine   Office Visit Note  Patient Name: Barbara Fitzpatrick DOB: August 08, 1951 MRN [id number]    Chief Complaint: Obstructive Sleep Apnea visit  Brief History:  Barbara Fitzpatrick is seen today for follow up consult The patient has a 5 year history of sleep apnea. Patient is using PAP nightly.  The patient feels refreshed, most of the time, after sleeping with PAP.  The patient reports benefits but not as much as she used to from PAP therapy, less energy. from PAP use.  Epworth Sleepiness Score is 1 out of 24. The patient does not take naps. The patient complains of  feeling like she has no energy and sleep the rest of the day.  The compliance download shows  compliance with an average use time of 8:46 hours @ 99%. The AHI is 0.6  The patient does not complain  of limb movements disrupting sleep.  ROS  General: (-) fever, (-) chills, (-) night sweat Nose and Sinuses: (-) nasal stuffiness or itchiness, (-) postnasal drip, (-) nosebleeds, (-) sinus trouble. Mouth and Throat: (-) sore throat, (-) hoarseness. Neck: (-) swollen glands, (-) enlarged thyroid, (-) neck pain. Respiratory: - cough, - shortness of breath, - wheezing. Neurologic: - numbness, - tingling. Psychiatric: + anxiety, + depression   Current Medication: Outpatient Encounter Medications as of 08/13/2021  Medication Sig   acyclovir ointment (ZOVIRAX) 5 % Apply 1 application topically 3 (three) times daily as needed.   aluminum chloride (DRYSOL) 20 % external solution Apply topically at bedtime.   aspirin 81 MG tablet Take 81 mg by mouth daily.   atorvastatin (LIPITOR) 80 MG tablet Take 1 tablet (80 mg total) by mouth daily.   Blood Glucose Monitoring Suppl (ONETOUCH VERIO) w/Device KIT Use to check blood sugar 3 times daily and document for visits.  Bring log to visits.   buPROPion (WELLBUTRIN XL) 300 MG 24 hr tablet TAKE 1 TABLET(300 MG) BY MOUTH DAILY    cetirizine (ZYRTEC) 10 MG tablet Take 10 mg by mouth daily.   Cholecalciferol (VITAMIN D-3 PO) Take 3,000 Units by mouth daily.    Coenzyme Q10 (CO Q-10 PO) Take by mouth daily.   EPIPEN 2-PAK 0.3 MG/0.3ML SOAJ injection as needed.    ezetimibe (ZETIA) 10 MG tablet Take 1 tablet (10 mg total) by mouth daily.   fexofenadine (ALLEGRA) 180 MG tablet 1 tablet   glucosamine-chondroitin 500-400 MG tablet Take 1 tablet by mouth 2 (two) times daily.   glucose blood test strip Use to check blood sugar 3 times daily and document for visits.  Bring log to visits.   hydrochlorothiazide (HYDRODIURIL) 25 MG tablet TAKE 1 TABLET(25 MG) BY MOUTH DAILY   Magnesium 250 MG TABS Take by mouth daily.    metFORMIN (GLUCOPHAGE) 500 MG tablet Start out taking 500 MG (1 tablet) by mouth every morning for one week, and then increase to 500 MG (1 tablet) by mouth two times daily for one week, and then increase to 1000 MG (2 tablets) by mouth in morning and 500 MG (1 tablet) by mouth in evening for one week, and then increase to 1000 MG (2 tablets) by mouth twice a day.   Methylcellulose, Laxative, (FIBER THERAPY PO) Take 1 tablet by mouth daily.   Multiple Vitamins-Minerals (MULTIVITAMIN PO) Take by mouth daily.   nystatin (NYSTATIN) powder Apply topically 3 (three) times daily as needed.   nystatin cream (MYCOSTATIN) Apply 1 application topically  2 (two) times daily.   Olopatadine HCl 0.6 % SOLN U 2 SPRAYS IEN BID   omeprazole (PRILOSEC) 20 MG capsule Take 20 mg by mouth daily.   OneTouch Delica Lancets 53G MISC Use to check blood sugar 3 times daily and document for visits.  Bring log to visits.   Probiotic Product (PROBIOTIC COLON SUPPORT PO) Take by mouth. Take one pill every other day   quinapril (ACCUPRIL) 40 MG tablet TAKE 1 TABLET(40 MG) BY MOUTH AT BEDTIME   valACYclovir (VALTREX) 1000 MG tablet Take one tablet twice daily at the first sign of a flare for about 3-5 days   vitamin B-12 (CYANOCOBALAMIN) 1000 MCG  tablet Take 1,000 mcg by mouth daily.   vitamin E 1000 UNIT capsule Take 1,000 Units by mouth daily.   No facility-administered encounter medications on file as of 08/13/2021.    Surgical History: Past Surgical History:  Procedure Laterality Date   ABDOMINAL HYSTERECTOMY     COLONOSCOPY     hysterectomy     with bladder tack   TUBAL LIGATION      Medical History: Past Medical History:  Diagnosis Date   Arthritis    Depression    GERD (gastroesophageal reflux disease)    Hyperlipidemia    Hypertension    Personal history of colonic adenomas 06/16/2013   4 adenomas (max 1 cm)  04/2003  no polyps 04/2005    Sleep apnea    uses c-pap    Family History: Non contributory to the present illness  Social History: Social History   Socioeconomic History   Marital status: Single    Spouse name: Not on file   Number of children: Not on file   Years of education: Not on file   Highest education level: 12th grade  Occupational History   Occupation: retired  Tobacco Use   Smoking status: Never   Smokeless tobacco: Never  Vaping Use   Vaping Use: Never used  Substance and Sexual Activity   Alcohol use: No   Drug use: No   Sexual activity: Not Currently  Other Topics Concern   Not on file  Social History Narrative   Not on file   Social Determinants of Health   Financial Resource Strain: Not on file  Food Insecurity: Not on file  Transportation Needs: Not on file  Physical Activity: Not on file  Stress: Not on file  Social Connections: Not on file  Intimate Partner Violence: Not on file    Vital Signs: Blood pressure (!) 166/86, pulse 87, resp. rate 16, height '5\' 3"'  (1.6 m), weight 245 lb (111.1 kg), SpO2 95 %.  Examination: General Appearance: The patient is well-developed, well-nourished, and in no distress. Neck Circumference: 42 cm Skin: Gross inspection of skin unremarkable. Head: normocephalic, no gross deformities. Eyes: no gross deformities noted. ENT:  ears appear grossly normal Neurologic: Alert and oriented. No involuntary movements.    EPWORTH SLEEPINESS SCALE:  Scale:  (0)= no chance of dozing; (1)= slight chance of dozing; (2)= moderate chance of dozing; (3)= high chance of dozing  Chance  Situtation    Sitting and reading: 0    Watching TV: 1    Sitting Inactive in public: 0    As a passenger in car: 0      Lying down to rest: 0    Sitting and talking: 0    Sitting quielty after lunch: 0    In a car, stopped in traffic: 0   TOTAL SCORE:  1 out of 24    SLEEP STUDIES:  PSG 2017, AHI 10.1, SpO2 minimum 79%,   CPAP COMPLIANCE DATA:  Date Range: 08/10/20 - 08/09/21   Average Daily Use: 8:46 hours  Median Use: 8:50 hours  Compliance for > 4 Hours: 99%  AHI: 0.6 respiratory events per hour  Days Used: 363/365  Mask Leak: 10.2  95th Percentile Pressure: 7cmH2O         LABS: Recent Results (from the past 2160 hour(s))  Comprehensive metabolic panel     Status: Abnormal   Collection Time: 08/07/21 11:09 AM  Result Value Ref Range   Glucose 190 (H) 65 - 99 mg/dL   BUN 12 8 - 27 mg/dL   Creatinine, Ser 0.92 0.57 - 1.00 mg/dL   eGFR 67 >59 mL/min/1.73   BUN/Creatinine Ratio 13 12 - 28   Sodium 141 134 - 144 mmol/L   Potassium 4.0 3.5 - 5.2 mmol/L   Chloride 100 96 - 106 mmol/L   CO2 25 20 - 29 mmol/L   Calcium 10.5 (H) 8.7 - 10.3 mg/dL   Total Protein 6.8 6.0 - 8.5 g/dL   Albumin 4.3 3.8 - 4.8 g/dL   Globulin, Total 2.5 1.5 - 4.5 g/dL   Albumin/Globulin Ratio 1.7 1.2 - 2.2   Bilirubin Total 0.5 0.0 - 1.2 mg/dL   Alkaline Phosphatase 102 44 - 121 IU/L   AST 36 0 - 40 IU/L   ALT 51 (H) 0 - 32 IU/L  Lipid Panel w/o Chol/HDL Ratio     Status: Abnormal   Collection Time: 08/07/21 11:09 AM  Result Value Ref Range   Cholesterol, Total 147 100 - 199 mg/dL   Triglycerides 172 (H) 0 - 149 mg/dL   HDL 42 >39 mg/dL   VLDL Cholesterol Cal 29 5 - 40 mg/dL   LDL Chol Calc (NIH) 76 0 - 99 mg/dL   HgB A1c     Status: Abnormal   Collection Time: 08/07/21 11:09 AM  Result Value Ref Range   Hgb A1c MFr Bld 7.7 (H) 4.8 - 5.6 %    Comment:          Prediabetes: 5.7 - 6.4          Diabetes: >6.4          Glycemic control for adults with diabetes: <7.0    Est. average glucose Bld gHb Est-mCnc 174 mg/dL    Radiology: MM 3D SCREEN BREAST BILATERAL  Result Date: 02/20/2021 CLINICAL DATA:  Screening. EXAM: DIGITAL SCREENING BILATERAL MAMMOGRAM WITH TOMOSYNTHESIS AND CAD TECHNIQUE: Bilateral screening digital craniocaudal and mediolateral oblique mammograms were obtained. Bilateral screening digital breast tomosynthesis was performed. The images were evaluated with computer-aided detection. COMPARISON:  Previous exam(s). ACR Breast Density Category b: There are scattered areas of fibroglandular density. FINDINGS: There are no findings suspicious for malignancy. The images were evaluated with computer-aided detection. IMPRESSION: No mammographic evidence of malignancy. A result letter of this screening mammogram will be mailed directly to the patient. RECOMMENDATION: Screening mammogram in one year. (Code:SM-B-01Y) BI-RADS CATEGORY  1: Negative. Electronically Signed   By: Lillia Mountain M.D.   On: 02/20/2021 09:13    No results found.  No results found.    Assessment and Plan: Patient Active Problem List   Diagnosis Date Noted   OSA on CPAP 08/13/2021   CPAP use counseling 08/13/2021   Memory changes 01/25/2021   Bilateral carotid bruits 01/11/2020   Osteopenia 07/23/2018   Depression, major, single episode, severe (  [place]) 06/17/2018   GERD (gastroesophageal reflux disease) 11/03/2017   Advanced care planning/counseling discussion 11/03/2017   Sleep apnea 10/15/2016   Morbid obesity ([place]) 03/13/2016   Type 2 diabetes mellitus with morbid obesity ([place]) 09/15/2015   Hypertension associated with type 2 diabetes mellitus ([place]) 09/15/2015   Hyperlipidemia associated with type 2 diabetes  mellitus ([place]) 09/15/2015    1. OSA on CPAP The patient does  tolerate PAP and reports definite benefit from PAP use. The patient was reminded how to clean equipment and advised to replace supplies routinely. The patient was also counselled on weight loss. The compliance is excellent. The AHI is 0.6.   OSA- continue excellent compliance.    2. CPAP use counseling CPAP Counseling: had a lengthy discussion with the patient regarding the importance of PAP therapy in management of the sleep apnea. Patient appears to understand the risk factor reduction and also understands the risks associated with untreated sleep apnea. Patient will try to make a good faith effort to remain compliant with therapy. Also instructed the patient on proper cleaning of the device including the water must be changed daily if possible and use of distilled water is preferred. Patient understands that the machine should be regularly cleaned with appropriate recommended cleaning solutions that do not damage the PAP machine for example given white vinegar and water rinses. Other methods such as ozone treatment may not be as good as these simple methods to achieve cleaning.   3. Morbid obesity ([place]) Obesity Counseling: Had a lengthy discussion regarding patients BMI and weight issues. Patient was instructed on portion control as well as increased activity. Also discussed caloric restrictions with trying to maintain intake less than 2000 Kcal. Discussions were made in accordance with the 5As of weight management. Simple actions such as not eating late and if able to, taking a walk is suggested.   4. Hypertension associated with type 2 diabetes mellitus ([place]) Hypertension Counseling:   The following hypertensive lifestyle modification were recommended and discussed:  1. Limiting alcohol intake to less than 1 oz/day of ethanol:(24 oz of beer or 8 oz of wine or 2 oz of 100-proof whiskey). 2. Take baby ASA 81 mg daily. 3.  Importance of regular aerobic exercise and losing weight. 4. Reduce dietary saturated fat and cholesterol intake for overall cardiovascular health. 5. Maintaining adequate dietary potassium, calcium, and magnesium intake. 6. Regular monitoring of the blood pressure. 7. Reduce sodium intake to less than 100 mmol/day (less than 2.3 gm of sodium or less than 6 gm of sodium choride)     General Counseling: I have discussed the findings of the evaluation and examination with Barbara Fitzpatrick.  I have also discussed any further diagnostic evaluation thatmay be needed or ordered today. Barbara Fitzpatrick verbalizes understanding of the findings of todays visit. We also reviewed her medications today and discussed drug interactions and side effects including but not limited excessive drowsiness and altered mental states. We also discussed that there is always a risk not just to her but also people around her. she has been encouraged to call the office with any questions or concerns that should arise related to todays visit.  No orders of the defined types were placed in this encounter.       I have personally obtained a history, examined the patient, evaluated laboratory and imaging results, formulated the assessment and plan and placed orders.   This patient was seen today by Tressie Ellis, PA-C in collaboration with Dr. Devona Konig.  Barbara Branscum A  Humphrey Rolls, MD [facility] Diplomate ABMS Pulmonary and Critical Care Medicine Sleep medicine

## 2021-08-11 ENCOUNTER — Encounter: Payer: Self-pay | Admitting: Nurse Practitioner

## 2021-08-11 LAB — COMPREHENSIVE METABOLIC PANEL
ALT: 51 IU/L — ABNORMAL HIGH (ref 0–32)
AST: 36 IU/L (ref 0–40)
Albumin/Globulin Ratio: 1.7 (ref 1.2–2.2)
Albumin: 4.3 g/dL (ref 3.8–4.8)
Alkaline Phosphatase: 102 IU/L (ref 44–121)
BUN/Creatinine Ratio: 13 (ref 12–28)
BUN: 12 mg/dL (ref 8–27)
Bilirubin Total: 0.5 mg/dL (ref 0.0–1.2)
CO2: 25 mmol/L (ref 20–29)
Calcium: 10.5 mg/dL — ABNORMAL HIGH (ref 8.7–10.3)
Chloride: 100 mmol/L (ref 96–106)
Creatinine, Ser: 0.92 mg/dL (ref 0.57–1.00)
Globulin, Total: 2.5 g/dL (ref 1.5–4.5)
Glucose: 190 mg/dL — ABNORMAL HIGH (ref 65–99)
Potassium: 4 mmol/L (ref 3.5–5.2)
Sodium: 141 mmol/L (ref 134–144)
Total Protein: 6.8 g/dL (ref 6.0–8.5)
eGFR: 67 mL/min/{1.73_m2} (ref >59)

## 2021-08-11 LAB — LIPID PANEL W/O CHOL/HDL RATIO
Cholesterol, Total: 147 mg/dL (ref 100–199)
HDL: 42 mg/dL (ref >39)
LDL Chol Calc (NIH): 76 mg/dL (ref 0–99)
Triglycerides: 172 mg/dL — ABNORMAL HIGH (ref 0–149)
VLDL Cholesterol Cal: 29 mg/dL (ref 5–40)

## 2021-08-11 LAB — HEMOGLOBIN A1C
Est. average glucose Bld gHb Est-mCnc: 174 mg/dL
Hgb A1c MFr Bld: 7.7 % — ABNORMAL HIGH (ref 4.8–5.6)

## 2021-08-11 MED ORDER — METFORMIN HCL 500 MG PO TABS: ORAL_TABLET | ORAL | 4 refills | Status: DC

## 2021-08-11 MED ORDER — GLUCOSE BLOOD VI STRP: ORAL_STRIP | 4 refills | Status: DC

## 2021-08-11 MED ORDER — ONETOUCH VERIO W/DEVICE KIT: PACK | 1 refills | Status: DC

## 2021-08-11 MED ORDER — ONETOUCH DELICA LANCETS 33G MISC: 4 refills | Status: DC

## 2021-08-11 NOTE — Progress Notes (Signed)
Please reach out to patient Monday and schedule for 4 week follow-up, I have sent her a MyChart message in regard to her labs showing new diagnosis of diabetes.  Thanks.

## 2021-08-11 NOTE — Addendum Note (Signed)
Addended by: Marnee Guarneri T on: 08/11/2021 10:17 AM   Modules accepted: Orders

## 2021-08-11 NOTE — Progress Notes (Signed)
Contacted via [place] morning [place], your labs havre returned: - Cholesterol levels, show LDL close to goal of < 70, continue medication. - CMP shows elevation in glucose, which goes along with your A1c.  This has unfortunately elevated to the diabetic level as it is now 7.7%, this is above 6.5% meaning it is diabetic level.  I have sent in Metformin, which I would like you to start taking as instructed on prescription label.  I have also sent in a glucometer and supplies -- recommend you check blood sugar 3 times a day with goal less the 130 fasting in morning and <180 2 hours after eating.  Focus heavily on diet changes and I will have staff call Monday to schedule you to return in 4 weeks to see how you are tolerating medication.  We will recheck A1c in 3 months.  - Calcium mildly elevated, which we will continue to monitor and ALT, a liver test, also with mild elevation -- we will continue to monitor this.  Any questions? Keep being stellar!!  Thank you for allowing me to participate in your care.  I appreciate you. Kindest regards, [place] Hijazi

## 2021-08-13 ENCOUNTER — Ambulatory Visit (INDEPENDENT_AMBULATORY_CARE_PROVIDER_SITE_OTHER): Payer: Medicare PPO | Admitting: Internal Medicine

## 2021-08-13 ENCOUNTER — Other Ambulatory Visit: Payer: Self-pay

## 2021-08-13 VITALS — BP 166/86 | HR 87 | Resp 16 | Ht 63.0 in | Wt 245.0 lb

## 2021-08-13 DIAGNOSIS — G4733 Obstructive sleep apnea (adult) (pediatric): Secondary | ICD-10-CM

## 2021-08-13 DIAGNOSIS — I152 Hypertension secondary to endocrine disorders: Secondary | ICD-10-CM

## 2021-08-13 DIAGNOSIS — Z7189 Other specified counseling: Secondary | ICD-10-CM | POA: Insufficient documentation

## 2021-08-13 DIAGNOSIS — Z9989 Dependence on other enabling machines and devices: Secondary | ICD-10-CM | POA: Diagnosis not present

## 2021-08-13 DIAGNOSIS — E1159 Type 2 diabetes mellitus with other circulatory complications: Secondary | ICD-10-CM

## 2021-08-13 NOTE — Patient Instructions (Signed)

## 2021-08-14 DIAGNOSIS — J3089 Other allergic rhinitis: Secondary | ICD-10-CM | POA: Diagnosis not present

## 2021-08-21 DIAGNOSIS — J3089 Other allergic rhinitis: Secondary | ICD-10-CM | POA: Diagnosis not present

## 2021-08-30 ENCOUNTER — Ambulatory Visit: Payer: Medicare PPO | Admitting: Nurse Practitioner

## 2021-08-30 DIAGNOSIS — J3089 Other allergic rhinitis: Secondary | ICD-10-CM | POA: Diagnosis not present

## 2021-09-04 DIAGNOSIS — J3089 Other allergic rhinitis: Secondary | ICD-10-CM | POA: Diagnosis not present

## 2021-09-06 ENCOUNTER — Encounter: Payer: Self-pay | Admitting: Nurse Practitioner

## 2021-09-06 ENCOUNTER — Ambulatory Visit: Payer: Medicare PPO | Admitting: Nurse Practitioner

## 2021-09-06 ENCOUNTER — Other Ambulatory Visit: Payer: Self-pay

## 2021-09-06 VITALS — BP 134/81 | HR 72 | Temp 98.6°F | Wt 233.0 lb

## 2021-09-06 DIAGNOSIS — E1169 Type 2 diabetes mellitus with other specified complication: Secondary | ICD-10-CM | POA: Diagnosis not present

## 2021-09-06 DIAGNOSIS — Z23 Encounter for immunization: Secondary | ICD-10-CM

## 2021-09-06 NOTE — Progress Notes (Signed)
BP 134/81   Pulse 72   Temp 98.6 F (37 C) (Oral)   Wt 233 lb (105.7 kg)   LMP  (LMP Unknown)   SpO2 95%   BMI 41.27 kg/m    Subjective:    Patient ID: Barbara Fitzpatrick, female    DOB: 11-16-1951, 70 y.o.   MRN: [id number]  HPI: Barbara Fitzpatrick is a 70 y.o. female  Chief Complaint  Patient presents with   Follow-up   Medication Management    Patient states she has some issues with Diarrhea occasionally since the 3rd dosing of her new medication. Patient states she has kept a log of her sugar reading since the start of her new medication.    DIABETES Follow-up for diabetes today.  A1c in August was 7.7% (new diagnosis) -- started on Metformin -- taking 1000 MG in morning at this time and 500 MG at night.  Family history of diabetes -- mother, father, brother, sister. Hypoglycemic episodes:no Polydipsia/polyuria: no Visual disturbance: no Chest pain: no Paresthesias: no Glucose Monitoring: yes  Accucheck frequency: TID  Fasting glucose: in beginning 160-200 range and now 110 range  Post prandial:  Evening: 110 to 130  Before meals: Taking Insulin?: no  Long acting insulin:  Short acting insulin: Blood Pressure Monitoring: not checking Retinal Examination:  at end of this month -- [employer] Foot Exam: Up to Date Pneumovax: Up to Date Influenza: Up to Date Aspirin: yes   Relevant past medical, surgical, family and social history reviewed and updated as indicated. Interim medical history since our last visit reviewed. Allergies and medications reviewed and updated.  Review of Systems  Constitutional:  Negative for activity change, appetite change, diaphoresis, fatigue and fever.  Respiratory:  Negative for cough, chest tightness and shortness of breath.   Cardiovascular:  Negative for chest pain, palpitations and leg swelling.  Gastrointestinal: Negative.   Endocrine: Negative for polydipsia, polyphagia and polyuria.  Neurological: Negative.    Psychiatric/Behavioral: Negative.     Per HPI unless specifically indicated above     Objective:    BP 134/81   Pulse 72   Temp 98.6 F (37 C) (Oral)   Wt 233 lb (105.7 kg)   LMP  (LMP Unknown)   SpO2 95%   BMI 41.27 kg/m   Wt Readings from Last 3 Encounters:  09/06/21 233 lb (105.7 kg)  08/13/21 245 lb (111.1 kg)  08/07/21 243 lb (110.2 kg)    Physical Exam Constitutional:      Appearance: She is well-developed.  HENT:     Head: Normocephalic and atraumatic.  Eyes:     General: No scleral icterus.       Right eye: No discharge.        Left eye: No discharge.     Pupils: Pupils are equal, round, and reactive to light.  Neck:     Thyroid: No thyromegaly.     Vascular: No carotid bruit.  Cardiovascular:     Rate and Rhythm: Normal rate and regular rhythm.     Heart sounds: Normal heart sounds. No murmur heard.   No friction rub. No gallop.  Pulmonary:     Effort: Pulmonary effort is normal. No respiratory distress.     Breath sounds: Normal breath sounds. No wheezing or rales.  Abdominal:     General: Bowel sounds are normal.     Palpations: Abdomen is soft.     Tenderness: There is no abdominal tenderness. There is no rebound.  Musculoskeletal:  General: Normal range of motion.     Cervical back: Normal range of motion and neck supple.  Lymphadenopathy:     Cervical: No cervical adenopathy.  Skin:    General: Skin is warm and dry.     Findings: No rash.  Neurological:     Mental Status: She is alert and oriented to person, place, and time.  Psychiatric:        Speech: Speech normal.        Behavior: Behavior normal.        Thought Content: Thought content normal.        Judgment: Judgment normal.   Diabetic Foot Exam - Simple   Simple Foot Form Visual Inspection No deformities, no ulcerations, no other skin breakdown bilaterally: Yes Sensation Testing Intact to touch and monofilament testing bilaterally: Yes Pulse Check Posterior Tibialis and  Dorsalis pulse intact bilaterally: Yes Comments    Results for orders placed or performed in visit on 08/07/21  Comprehensive metabolic panel  Result Value Ref Range   Glucose 190 (H) 65 - 99 mg/dL   BUN 12 8 - 27 mg/dL   Creatinine, Ser 0.92 0.57 - 1.00 mg/dL   eGFR 67 >59 mL/min/1.73   BUN/Creatinine Ratio 13 12 - 28   Sodium 141 134 - 144 mmol/L   Potassium 4.0 3.5 - 5.2 mmol/L   Chloride 100 96 - 106 mmol/L   CO2 25 20 - 29 mmol/L   Calcium 10.5 (H) 8.7 - 10.3 mg/dL   Total Protein 6.8 6.0 - 8.5 g/dL   Albumin 4.3 3.8 - 4.8 g/dL   Globulin, Total 2.5 1.5 - 4.5 g/dL   Albumin/Globulin Ratio 1.7 1.2 - 2.2   Bilirubin Total 0.5 0.0 - 1.2 mg/dL   Alkaline Phosphatase 102 44 - 121 IU/L   AST 36 0 - 40 IU/L   ALT 51 (H) 0 - 32 IU/L  Lipid Panel w/o Chol/HDL Ratio  Result Value Ref Range   Cholesterol, Total 147 100 - 199 mg/dL   Triglycerides 172 (H) 0 - 149 mg/dL   HDL 42 >39 mg/dL   VLDL Cholesterol Cal 29 5 - 40 mg/dL   LDL Chol Calc (NIH) 76 0 - 99 mg/dL  HgB A1c  Result Value Ref Range   Hgb A1c MFr Bld 7.7 (H) 4.8 - 5.6 %   Est. average glucose Bld gHb Est-mCnc 174 mg/dL      Assessment & Plan:   Problem List Items Addressed This Visit       Endocrine   Type 2 diabetes mellitus with morbid obesity ([place])    Diagnosed 08/10/21 with A1c 7.7%.  At this time her blood sugars at home are trending down.  Will maintain her Metformin at 1000 MG in morning and 500 MG in evening to prevent any further issues with diarrhea -- in future if ongoing GI issues could consider transition to GLP1 which would also offer weight loss benefit.  At this time continue monitoring blood sugar twice a day and focus heavily on diet.  Has eye appointment upcoming.  Return in 2 months for A1c check.      Other Visit Diagnoses     Flu vaccine need    -  Primary   Relevant Orders   Flu Vaccine QUAD High Dose(Fluad)        Follow up plan: Return in about 2 months (around 11/06/2021) for  T2DM, HTN/HLD, MOOD.

## 2021-09-06 NOTE — Patient Instructions (Signed)
Diabetes Mellitus and Nutrition, Adult When you have diabetes, or diabetes mellitus, it is very important to have healthy eating habits because your blood sugar (glucose) levels are greatly affected by what you eat and drink. Eating healthy foods in the right amounts, at about the same times every day, can help you:  Control your blood glucose.  Lower your risk of heart disease.  Improve your blood pressure.  Reach or maintain a healthy weight. What can affect my meal plan? Every person with diabetes is different, and each person has different needs for a meal plan. Your health care provider may recommend that you work with a dietitian to make a meal plan that is best for you. Your meal plan may vary depending on factors such as:  The calories you need.  The medicines you take.  Your weight.  Your blood glucose, blood pressure, and cholesterol levels.  Your activity level.  Other health conditions you have, such as heart or kidney disease. How do carbohydrates affect me? Carbohydrates, also called carbs, affect your blood glucose level more than any other type of food. Eating carbs naturally raises the amount of glucose in your blood. Carb counting is a method for keeping track of how many carbs you eat. Counting carbs is important to keep your blood glucose at a healthy level, especially if you use insulin or take certain oral diabetes medicines. It is important to know how many carbs you can safely have in each meal. This is different for every person. Your dietitian can help you calculate how many carbs you should have at each meal and for each snack. How does alcohol affect me? Alcohol can cause a sudden decrease in blood glucose (hypoglycemia), especially if you use insulin or take certain oral diabetes medicines. Hypoglycemia can be a life-threatening condition. Symptoms of hypoglycemia, such as sleepiness, dizziness, and confusion, are similar to symptoms of having too much  alcohol.  Do not drink alcohol if: ? Your health care provider tells you not to drink. ? You are pregnant, may be pregnant, or are planning to become pregnant.  If you drink alcohol: ? Do not drink on an empty stomach. ? Limit how much you use to:  0-1 drink a day for women.  0-2 drinks a day for men. ? Be aware of how much alcohol is in your drink. In the [place] one drink equals one 12 oz bottle of beer (355 mL), one 5 oz glass of wine (148 mL), or one 1 oz glass of hard liquor (44 mL). ? Keep yourself hydrated with water, diet soda, or unsweetened iced tea.  Keep in mind that regular soda, juice, and other mixers may contain a lot of sugar and must be counted as carbs. What are tips for following this plan? Reading food labels  Start by checking the serving size on the "Nutrition Facts" label of packaged foods and drinks. The amount of calories, carbs, fats, and other nutrients listed on the label is based on one serving of the item. Many items contain more than one serving per package.  Check the total grams (g) of carbs in one serving. You can calculate the number of servings of carbs in one serving by dividing the total carbs by 15. For example, if a food has 30 g of total carbs per serving, it would be equal to 2 servings of carbs.  Check the number of grams (g) of saturated fats and trans fats in one serving. Choose foods that have   a low amount or none of these fats.  Check the number of milligrams (mg) of salt (sodium) in one serving. Most people should limit total sodium intake to less than 2,300 mg per day.  Always check the nutrition information of foods labeled as "low-fat" or "nonfat." These foods may be higher in added sugar or refined carbs and should be avoided.  Talk to your dietitian to identify your daily goals for nutrients listed on the label. Shopping  Avoid buying canned, pre-made, or processed foods. These foods tend to be high in fat, sodium, and added  sugar.  Shop around the outside edge of the grocery store. This is where you will most often find fresh fruits and vegetables, bulk grains, fresh meats, and fresh dairy. Cooking  Use low-heat cooking methods, such as baking, instead of high-heat cooking methods like deep frying.  Cook using healthy oils, such as olive, canola, or sunflower oil.  Avoid cooking with butter, cream, or high-fat meats. Meal planning  Eat meals and snacks regularly, preferably at the same times every day. Avoid going long periods of time without eating.  Eat foods that are high in fiber, such as fresh fruits, vegetables, beans, and whole grains. Talk with your dietitian about how many servings of carbs you can eat at each meal.  Eat 4-6 oz (112-168 g) of lean protein each day, such as lean meat, chicken, fish, eggs, or tofu. One ounce (oz) of lean protein is equal to: ? 1 oz (28 g) of meat, chicken, or fish. ? 1 egg. ?  cup (62 g) of tofu.  Eat some foods each day that contain healthy fats, such as avocado, nuts, seeds, and fish.   What foods should I eat? Fruits Berries. Apples. Oranges. Peaches. Apricots. Plums. Grapes. Mango. Papaya. Pomegranate. Kiwi. Cherries. Vegetables Lettuce. Spinach. Leafy greens, including kale, chard, collard greens, and mustard greens. Beets. Cauliflower. Cabbage. Broccoli. Carrots. Green beans. Tomatoes. Peppers. Onions. Cucumbers. Brussels sprouts. Grains Whole grains, such as whole-wheat or whole-grain bread, crackers, tortillas, cereal, and pasta. Unsweetened oatmeal. Quinoa. Brown or wild rice. Meats and other proteins Seafood. Poultry without skin. Lean cuts of poultry and beef. Tofu. Nuts. Seeds. Dairy Low-fat or fat-free dairy products such as milk, yogurt, and cheese. The items listed above may not be a complete list of foods and beverages you can eat. Contact a dietitian for more information. What foods should I avoid? Fruits Fruits canned with  syrup. Vegetables Canned vegetables. Frozen vegetables with butter or cream sauce. Grains Refined white flour and flour products such as bread, pasta, snack foods, and cereals. Avoid all processed foods. Meats and other proteins Fatty cuts of meat. Poultry with skin. Breaded or fried meats. Processed meat. Avoid saturated fats. Dairy Full-fat yogurt, cheese, or milk. Beverages Sweetened drinks, such as soda or iced tea. The items listed above may not be a complete list of foods and beverages you should avoid. Contact a dietitian for more information. Questions to ask a health care provider  Do I need to meet with a diabetes educator?  Do I need to meet with a dietitian?  What number can I call if I have questions?  When are the best times to check my blood glucose? Where to find more information:  American Diabetes Association: diabetes.org  Academy of Nutrition and Dietetics: www.eatright.org  National Institute of Diabetes and Digestive and Kidney Diseases: www.niddk.nih.gov  Association of Diabetes Care and Education Specialists: www.diabeteseducator.org Summary  It is important to have healthy eating   habits because your blood sugar (glucose) levels are greatly affected by what you eat and drink.  A healthy meal plan will help you control your blood glucose and maintain a healthy lifestyle.  Your health care provider may recommend that you work with a dietitian to make a meal plan that is best for you.  Keep in mind that carbohydrates (carbs) and alcohol have immediate effects on your blood glucose levels. It is important to count carbs and to use alcohol carefully. This information is not intended to replace advice given to you by your health care provider. Make sure you discuss any questions you have with your health care provider. Document Revised: 11/16/2019 Document Reviewed: 11/16/2019 Elsevier Patient Education  2021 [employer]  

## 2021-09-06 NOTE — Assessment & Plan Note (Signed)
Diagnosed 08/10/21 with A1c 7.7%.  At this time her blood sugars at home are trending down.  Will maintain her Metformin at 1000 MG in morning and 500 MG in evening to prevent any further issues with diarrhea -- in future if ongoing GI issues could consider transition to GLP1 which would also offer weight loss benefit.  At this time continue monitoring blood sugar twice a day and focus heavily on diet.  Has eye appointment upcoming.  Return in 2 months for A1c check.

## 2021-09-07 ENCOUNTER — Ambulatory Visit (INDEPENDENT_AMBULATORY_CARE_PROVIDER_SITE_OTHER): Payer: Medicare PPO

## 2021-09-07 DIAGNOSIS — Z0000 Encounter for general adult medical examination without abnormal findings: Secondary | ICD-10-CM | POA: Diagnosis not present

## 2021-09-07 NOTE — Patient Instructions (Signed)
Health Maintenance, Female Adopting a healthy lifestyle and getting preventive care are important in promoting health and wellness. Ask your health care provider about: The right schedule for you to have regular tests and exams. Things you can do on your own to prevent diseases and keep yourself healthy. What should I know about diet, weight, and exercise? Eat a healthy diet  Eat a diet that includes plenty of vegetables, fruits, low-fat dairy products, and lean protein. Do not eat a lot of foods that are high in solid fats, added sugars, or sodium. Maintain a healthy weight Body mass index (BMI) is used to identify weight problems. It estimates body fat based on height and weight. Your health care provider can help determine your BMI and help you achieve or maintain a healthy weight. Get regular exercise Get regular exercise. This is one of the most important things you can do for your health. Most adults should: Exercise for at least 150 minutes each week. The exercise should increase your heart rate and make you sweat (moderate-intensity exercise). Do strengthening exercises at least twice a week. This is in addition to the moderate-intensity exercise. Spend less time sitting. Even light physical activity can be beneficial. Watch cholesterol and blood lipids Have your blood tested for lipids and cholesterol at 69 years of age, then have this test every 5 years. Have your cholesterol levels checked more often if: Your lipid or cholesterol levels are high. You are older than 69 years of age. You are at high risk for heart disease. What should I know about cancer screening? Depending on your health history and family history, you may need to have cancer screening at various ages. This may include screening for: Breast cancer. Cervical cancer. Colorectal cancer. Skin cancer. Lung cancer. What should I know about heart disease, diabetes, and high blood pressure? Blood pressure and heart  disease High blood pressure causes heart disease and increases the risk of stroke. This is more likely to develop in people who have high blood pressure readings, are of African descent, or are overweight. Have your blood pressure checked: Every 3-5 years if you are 18-39 years of age. Every year if you are 40 years old or older. Diabetes Have regular diabetes screenings. This checks your fasting blood sugar level. Have the screening done: Once every three years after age 40 if you are at a normal weight and have a low risk for diabetes. More often and at a younger age if you are overweight or have a high risk for diabetes. What should I know about preventing infection? Hepatitis B If you have a higher risk for hepatitis B, you should be screened for this virus. Talk with your health care provider to find out if you are at risk for hepatitis B infection. Hepatitis C Testing is recommended for: Everyone born from 1945 through 1965. Anyone with known risk factors for hepatitis C. Sexually transmitted infections (STIs) Get screened for STIs, including gonorrhea and chlamydia, if: You are sexually active and are younger than 69 years of age. You are older than 69 years of age and your health care provider tells you that you are at risk for this type of infection. Your sexual activity has changed since you were last screened, and you are at increased risk for chlamydia or gonorrhea. Ask your health care provider if you are at risk. Ask your health care provider about whether you are at high risk for HIV. Your health care provider may recommend a prescription medicine   to help prevent HIV infection. If you choose to take medicine to prevent HIV, you should first get tested for HIV. You should then be tested every 3 months for as long as you are taking the medicine. Pregnancy If you are about to stop having your period (premenopausal) and you may become pregnant, seek counseling before you get  pregnant. Take 400 to 800 micrograms (mcg) of folic acid every day if you become pregnant. Ask for birth control (contraception) if you want to prevent pregnancy. Osteoporosis and menopause Osteoporosis is a disease in which the bones lose minerals and strength with aging. This can result in bone fractures. If you are 65 years old or older, or if you are at risk for osteoporosis and fractures, ask your health care provider if you should: Be screened for bone loss. Take a calcium or vitamin D supplement to lower your risk of fractures. Be given hormone replacement therapy (HRT) to treat symptoms of menopause. Follow these instructions at home: Lifestyle Do not use any products that contain nicotine or tobacco, such as cigarettes, e-cigarettes, and chewing tobacco. If you need help quitting, ask your health care provider. Do not use street drugs. Do not share needles. Ask your health care provider for help if you need support or information about quitting drugs. Alcohol use Do not drink alcohol if: Your health care provider tells you not to drink. You are pregnant, may be pregnant, or are planning to become pregnant. If you drink alcohol: Limit how much you use to 0-1 drink a day. Limit intake if you are breastfeeding. Be aware of how much alcohol is in your drink. In the [place] one drink equals one 12 oz bottle of beer (355 mL), one 5 oz glass of wine (148 mL), or one 1 oz glass of hard liquor (44 mL). General instructions Schedule regular health, dental, and eye exams. Stay current with your vaccines. Tell your health care provider if: You often feel depressed. You have ever been abused or do not feel safe at home. Summary Adopting a healthy lifestyle and getting preventive care are important in promoting health and wellness. Follow your health care provider's instructions about healthy diet, exercising, and getting tested or screened for diseases. Follow your health care provider's  instructions on monitoring your cholesterol and blood pressure. This information is not intended to replace advice given to you by your health care provider. Make sure you discuss any questions you have with your health care provider. Document Revised: 02/16/2021 Document Reviewed: 12/02/2018 Elsevier Patient Education  2022 [employer]  

## 2021-09-07 NOTE — Progress Notes (Signed)
Subjective:   Barbara Fitzpatrick is a 70 y.o. female who presents for Medicare Annual (Subsequent) preventive examination.  I connected with  Barbara Fitzpatrick on 09/07/21 by an audio only telemedicine application and verified that I am speaking with the correct person using two identifiers.   I discussed the limitations, risks, security and privacy concerns of performing an evaluation and management service by telephone and the availability of in person appointments. I also discussed with the patient that there may be a patient responsible charge related to this service. The patient expressed understanding and verbally consented to this telephonic visit.  Location of Patient: Home Location of Provider: [place] Persons Participating in Visit: Barbara Fitzpatrick (Patient), Irena Reichmann ([place])  List any persons and their role that are participating in the visit with the patient.     Review of Systems     Defer to Provider  Cardiac Risk Factors include: none     Objective:    Today's Vitals   09/07/21 1712  PainSc: 0-No pain   There is no height or weight on file to calculate BMI.  Advanced Directives 09/07/2021 08/11/2020 08/02/2019 06/15/2018 04/10/2017  Does Patient Have a Medical Advance Directive? _0   Type of Paramedic of [place];Living will [place];Living will Living will;Healthcare Power of [place];Living will Living will;Healthcare Power of Attorney  Does patient want to make changes to medical advance directive? No - Patient declined - - - -  Copy of [place] in Chart? - No - copy requested No - copy requested No - copy requested No - copy requested    Current Medications (verified) Outpatient Encounter Medications as of 09/07/2021  Medication Sig   acyclovir ointment (ZOVIRAX) 5 % Apply 1 application topically 3 (three) times daily as needed.    aluminum chloride (DRYSOL) 20 % external solution Apply topically at bedtime.   aspirin 81 MG tablet Take 81 mg by mouth daily.   atorvastatin (LIPITOR) 80 MG tablet Take 1 tablet (80 mg total) by mouth daily.   Blood Glucose Monitoring Suppl (ONETOUCH VERIO) w/Device KIT Use to check blood sugar 3 times daily and document for visits.  Bring log to visits.   buPROPion (WELLBUTRIN XL) 300 MG 24 hr tablet TAKE 1 TABLET(300 MG) BY MOUTH DAILY   cetirizine (ZYRTEC) 10 MG tablet Take 10 mg by mouth daily.   Cholecalciferol (VITAMIN D-3 PO) Take 3,000 Units by mouth daily.    Coenzyme Q10 (CO Q-10 PO) Take by mouth daily.   EPIPEN 2-PAK 0.3 MG/0.3ML SOAJ injection as needed.    ezetimibe (ZETIA) 10 MG tablet Take 1 tablet (10 mg total) by mouth daily.   glucosamine-chondroitin 500-400 MG tablet Take 1 tablet by mouth 2 (two) times daily.   glucose blood test strip Use to check blood sugar 3 times daily and document for visits.  Bring log to visits.   hydrochlorothiazide (HYDRODIURIL) 25 MG tablet TAKE 1 TABLET(25 MG) BY MOUTH DAILY   Magnesium 250 MG TABS Take by mouth daily.    metFORMIN (GLUCOPHAGE) 500 MG tablet Start out taking 500 MG (1 tablet) by mouth every morning for one week, and then increase to 500 MG (1 tablet) by mouth two times daily for one week, and then increase to 1000 MG (2 tablets) by mouth in morning and 500 MG (1 tablet) by mouth in evening for one week, and then increase to 1000  MG (2 tablets) by mouth twice a day.   Methylcellulose, Laxative, (FIBER THERAPY PO) Take 1 tablet by mouth daily.   Multiple Vitamins-Minerals (MULTIVITAMIN PO) Take by mouth daily.   nystatin (NYSTATIN) powder Apply topically 3 (three) times daily as needed.   nystatin cream (MYCOSTATIN) Apply 1 application topically 2 (two) times daily.   Olopatadine HCl 0.6 % SOLN U 2 SPRAYS IEN BID   omeprazole (PRILOSEC) 20 MG capsule Take 20 mg by mouth daily.   OneTouch Delica Lancets 46F MISC Use to check blood  sugar 3 times daily and document for visits.  Bring log to visits.   Probiotic Product (PROBIOTIC COLON SUPPORT PO) Take by mouth. Take one pill every other day   quinapril (ACCUPRIL) 40 MG tablet TAKE 1 TABLET(40 MG) BY MOUTH AT BEDTIME   valACYclovir (VALTREX) 1000 MG tablet Take one tablet twice daily at the first sign of a flare for about 3-5 days   vitamin B-12 (CYANOCOBALAMIN) 1000 MCG tablet Take 1,000 mcg by mouth daily.   vitamin E 1000 UNIT capsule Take 1,000 Units by mouth daily.   fexofenadine (ALLEGRA) 180 MG tablet 1 tablet (Patient not taking: Reported on 09/07/2021)   No facility-administered encounter medications on file as of 09/07/2021.    Allergies (verified) Patient has no known allergies.   History: Past Medical History:  Diagnosis Date   Arthritis    Depression    Diabetes ([place])    GERD (gastroesophageal reflux disease)    Hyperlipidemia    Hypertension    Personal history of colonic adenomas 06/16/2013   4 adenomas (max 1 cm)  04/2003  no polyps 04/2005    Sleep apnea    uses c-pap   Past Surgical History:  Procedure Laterality Date   ABDOMINAL HYSTERECTOMY     COLONOSCOPY     hysterectomy     with bladder tack   TUBAL LIGATION     Family History  Problem Relation Age of Onset   Hypertension Mother    Hyperlipidemia Mother    Diabetes Mother    Cancer Mother        colon (benign)   Glaucoma Mother    Heart disease Father    Hypertension Father    Hyperlipidemia Father    Diabetes Father    Diabetes Sister    Lung disease Sister        polyps   Cancer Brother        kidney   Glaucoma Brother    Hyperlipidemia Brother    Hypertension Brother    Stroke Maternal Grandfather    Diabetes Paternal Grandmother    Heart disease Paternal Grandfather    Cancer Maternal Aunt 5       ovarian   Colon cancer Neg Hx    Esophageal cancer Neg Hx    Rectal cancer Neg Hx    Stomach cancer Neg Hx    Social History   Socioeconomic History   Marital  status: Single    Spouse name: Not on file   Number of children: Not on file   Years of education: Not on file   Highest education level: 12th grade  Occupational History   Occupation: retired  Tobacco Use   Smoking status: Never   Smokeless tobacco: Never  Vaping Use   Vaping Use: Never used  Substance and Sexual Activity   Alcohol use: No   Drug use: No   Sexual activity: Not Currently  Other Topics Concern   Not  on file  Social History Narrative   Not on file   Social Determinants of Health   Financial Resource Strain: Low Risk    Difficulty of Paying Living Expenses: Not very hard  Food Insecurity: Food Insecurity Present   Worried About Charity fundraiser in the Last Year: Never true   Ran Out of Food in the Last Year: Sometimes true  Transportation Needs: No Transportation Needs   Lack of Transportation (Medical): No   Lack of Transportation (Non-Medical): No  Physical Activity: Insufficiently Active   Days of Exercise per Week: 2 days   Minutes of Exercise per Session: 30 min  Stress: No Stress Concern Present   Feeling of Stress : Only a little  Social Connections: Moderately Isolated   Frequency of Communication with Friends and Family: More than three times a week   Frequency of Social Gatherings with Friends and Family: More than three times a week   Attends Religious Services: More than 4 times per year   Active Member of [employer] or Organizations: No   Attends Archivist Meetings: Never   Marital Status: Widowed    Tobacco Counseling Counseling given: Not Answered   Clinical Intake:  Pre-visit preparation completed: Yes  Pain : No/denies pain Pain Score: 0-No pain     Nutritional Risks: None Diabetes: Yes CBG done?: No Did pt. bring in CBG monitor from home?: No  How often do you need to have someone help you when you read instructions, pamphlets, or other written materials from your doctor or pharmacy?: 1 - Never What is the last  grade level you completed in school?: 12th  Diabetic? No  Interpreter Needed?: No      Activities of Daily Living In your present state of health, do you have any difficulty performing the following activities: 09/07/2021 09/06/2021  Hearing? N N  Vision? N N  Difficulty concentrating or making decisions? N N  Walking or climbing stairs? N N  Dressing or bathing? N N  Doing errands, shopping? N N  Preparing Food and eating ? N -  Using the Toilet? N -  In the past six months, have you accidently leaked urine? Y -  Do you have problems with loss of bowel control? N -  Managing your Medications? N -  Managing your Finances? N -  Housekeeping or managing your Housekeeping? N -  Some recent data might be hidden    Patient Care Team: Venita Lick, NP as PCP - General (Nurse Practitioner)  Indicate any recent Medical Services you may have received from other than [facility] providers in the past year (date may be approximate).     Assessment:   This is a routine wellness examination for Barbara Fitzpatrick.  Hearing/Vision screen No results found.  Dietary issues and exercise activities discussed: Current Exercise Habits: Home exercise routine, Exercise limited by: None identified   Goals Addressed   None   Depression Screen PHQ 2/9 Scores 09/07/2021 08/07/2021 01/25/2021 09/14/2020 08/11/2020 01/11/2020 08/02/2019  PHQ - 2 Score 1 1 0 0 0 5 2  PHQ- 9 Score - 9 0 2 0 13 2    Fall Risk Fall Risk  09/07/2021 01/25/2021 09/14/2020 09/14/2020 08/11/2020  Falls in the past year? 0 0 0 0 0  Number falls in past yr: 0 - 0 0 -  Injury with Fall? 0 - 0 0 -  Risk for fall due to : No Fall Risks - - - Medication side effect  Follow  up Falls evaluation completed - Falls evaluation completed Falls evaluation completed Falls evaluation completed;Education provided;Falls prevention discussed    FALL RISK PREVENTION PERTAINING TO THE HOME:  Any stairs in or around the home? No  If so, are there any without  handrails? No  Home free of loose throw rugs in walkways, pet beds, electrical cords, etc? Yes  Adequate lighting in your home to reduce risk of falls? Yes   ASSISTIVE DEVICES UTILIZED TO PREVENT FALLS:  Life alert? No  Use of a cane, walker or w/c? No  Grab bars in the bathroom? Yes  Shower chair or bench in shower? Yes  Elevated toilet seat or a handicapped toilet? No   TIMED UP AND GO:  Was the test performed? N/A.  Length of time to ambulate 10 feet: N/A sec.     Cognitive Function:     6CIT Screen 09/07/2021 01/25/2021 08/11/2020 06/15/2018  What Year? 0 points 0 points 0 points 0 points  What month? 0 points 0 points 0 points 0 points  What time? 0 points 0 points 0 points 0 points  Count back from 20 0 points 0 points 0 points 0 points  Months in reverse 0 points 2 points 0 points 0 points  Repeat phrase 0 points 0 points 2 points 0 points  Total Score 0 2 2 0    Immunizations Immunization History  Administered Date(s) Administered   Fluad Quad(high Dose 65+) 09/22/2019, 09/14/2020, 09/06/2021   Influenza, High Dose Seasonal PF 12/31/2016, 11/03/2017, 12/30/2017, 09/14/2018, 01/01/2019, 12/28/2019, 12/26/2020   Influenza,inj,Quad PF,6+ Mos 09/15/2015, 10/15/2016   Moderna Sars-Covid-2 Vaccination 02/08/2020, 03/07/2020, 12/26/2020, 07/17/2021   Pneumococcal Conjugate-13 11/03/2017   Pneumococcal Polysaccharide-23 01/01/2019, 09/22/2019, 12/28/2019, 12/26/2020   Tdap 04/23/2012   Zoster Recombinat (Shingrix) 10/16/2020, 02/06/2021   Zoster, Live 03/16/2014    TDAP status: Up to date  Flu Vaccine status: Up to date  Pneumococcal vaccine status: Up to date  Covid-19 vaccine status: Completed vaccines  Qualifies for Shingles Vaccine? Yes   Zostavax completed Yes   Shingrix Completed?: Yes  Screening Tests Health Maintenance  Topic Date Due   OPHTHALMOLOGY EXAM  08/24/2020   FOOT EXAM  01/25/2022   HEMOGLOBIN A1C  02/07/2022   TETANUS/TDAP  04/23/2022    COLONOSCOPY (Pts 45-46yr Insurance coverage will need to be confirmed)  07/01/2022   MAMMOGRAM  02/16/2023   INFLUENZA VACCINE  Completed   DEXA SCAN  Completed   COVID-19 Vaccine  Completed   Hepatitis C Screening  Completed   Zoster Vaccines- Shingrix  Completed   HPV VACCINES  Aged Out    Health Maintenance  Health Maintenance Due  Topic Date Due   OPHTHALMOLOGY EXAM  08/24/2020    Colorectal cancer screening: Type of screening: Colonoscopy. Completed 07/01/2017. Repeat every 5 years  Mammogram status: Completed 02/16/2021. Repeat every year  Bone Density status: Completed 07/22/2018. Results reflect: Bone density results: NORMAL. Repeat every 10 years.  Lung Cancer Screening: (Low Dose CT Chest recommended if Age 69-80years, 30 pack-year currently smoking OR have quit w/in 15years.) does not qualify.   Lung Cancer Screening Referral: No  Additional Screening:  Hepatitis C Screening: does qualify; Completed 09/15/2015  Vision Screening: Recommended annual ophthalmology exams for early detection of glaucoma and other disorders of the eye. Is the patient up to date with their annual eye exam?  No Who is the provider or what is the name of the office in which the patient attends annual eye exams?  If  pt is not established with a provider, would they like to be referred to a provider to establish care? No .   Dental Screening: Recommended annual dental exams for proper oral hygiene  Community Resource Referral / Chronic Care Management: CRR required this visit?  No   CCM required this visit?  No      Plan:     I have personally reviewed and noted the following in the patient's chart:   Medical and social history Use of alcohol, tobacco or illicit drugs  Current medications and supplements including opioid prescriptions.  Functional ability and status Nutritional status Physical activity Advanced directives List of other physicians Hospitalizations,  surgeries, and ER visits in previous 12 months Vitals Screenings to include cognitive, depression, and falls Referrals and appointments  In addition, I have reviewed and discussed with patient certain preventive protocols, quality metrics, and best practice recommendations. A written personalized care plan for preventive services as well as general preventive health recommendations were provided to patient.  I connected with  Barbara Fitzpatrick on 09/07/21 by a video enabled telemedicine application and verified that I am speaking with the correct person using two identifiers.   I discussed the limitations of evaluation and management by telemedicine. The patient expressed understanding and agreed to proceed.      Irena Reichmann, [facility]   09/07/2021   Nurse Notes: Non Face to Face 60 minutes

## 2021-09-11 DIAGNOSIS — J3089 Other allergic rhinitis: Secondary | ICD-10-CM | POA: Diagnosis not present

## 2021-09-18 DIAGNOSIS — J3089 Other allergic rhinitis: Secondary | ICD-10-CM | POA: Diagnosis not present

## 2021-09-25 DIAGNOSIS — J3089 Other allergic rhinitis: Secondary | ICD-10-CM | POA: Diagnosis not present

## 2021-10-02 DIAGNOSIS — J3089 Other allergic rhinitis: Secondary | ICD-10-CM | POA: Diagnosis not present

## 2021-10-04 DIAGNOSIS — J3089 Other allergic rhinitis: Secondary | ICD-10-CM | POA: Diagnosis not present

## 2021-10-09 DIAGNOSIS — J3089 Other allergic rhinitis: Secondary | ICD-10-CM | POA: Diagnosis not present

## 2021-10-16 DIAGNOSIS — J3089 Other allergic rhinitis: Secondary | ICD-10-CM | POA: Diagnosis not present

## 2021-10-26 ENCOUNTER — Other Ambulatory Visit: Payer: Self-pay | Admitting: Nurse Practitioner

## 2021-10-26 MED ORDER — VALACYCLOVIR HCL 1 G PO TABS: ORAL_TABLET | ORAL | 6 refills | Status: DC

## 2021-10-30 DIAGNOSIS — J3089 Other allergic rhinitis: Secondary | ICD-10-CM | POA: Diagnosis not present

## 2021-11-08 ENCOUNTER — Other Ambulatory Visit: Payer: Self-pay

## 2021-11-08 ENCOUNTER — Ambulatory Visit: Payer: Medicare PPO | Admitting: Nurse Practitioner

## 2021-11-08 ENCOUNTER — Encounter: Payer: Self-pay | Admitting: Nurse Practitioner

## 2021-11-08 DIAGNOSIS — E1159 Type 2 diabetes mellitus with other circulatory complications: Secondary | ICD-10-CM | POA: Diagnosis not present

## 2021-11-08 DIAGNOSIS — E785 Hyperlipidemia, unspecified: Secondary | ICD-10-CM | POA: Diagnosis not present

## 2021-11-08 DIAGNOSIS — I152 Hypertension secondary to endocrine disorders: Secondary | ICD-10-CM | POA: Diagnosis not present

## 2021-11-08 DIAGNOSIS — E1169 Type 2 diabetes mellitus with other specified complication: Secondary | ICD-10-CM | POA: Diagnosis not present

## 2021-11-08 DIAGNOSIS — F322 Major depressive disorder, single episode, severe without psychotic features: Secondary | ICD-10-CM | POA: Diagnosis not present

## 2021-11-08 DIAGNOSIS — G4733 Obstructive sleep apnea (adult) (pediatric): Secondary | ICD-10-CM

## 2021-11-08 DIAGNOSIS — E538 Deficiency of other specified B group vitamins: Secondary | ICD-10-CM | POA: Diagnosis not present

## 2021-11-08 DIAGNOSIS — Z9989 Dependence on other enabling machines and devices: Secondary | ICD-10-CM

## 2021-11-08 DIAGNOSIS — J3089 Other allergic rhinitis: Secondary | ICD-10-CM | POA: Diagnosis not present

## 2021-11-08 LAB — MICROALBUMIN, URINE WAIVED
Creatinine, Urine Waived: 10 mg/dL (ref 10–300)
Microalb, Ur Waived: 10 mg/L (ref 0–19)
Microalb/Creat Ratio: <30 mg/g (ref <30)

## 2021-11-08 LAB — BAYER DCA HB A1C WAIVED: HB A1C (BAYER DCA - WAIVED): 6 % — ABNORMAL HIGH (ref 4.8–5.6)

## 2021-11-08 MED ORDER — METFORMIN HCL 500 MG PO TABS: ORAL_TABLET | ORAL | 4 refills | Status: DC

## 2021-11-08 MED ORDER — HYDROCHLOROTHIAZIDE 25 MG PO TABS: ORAL_TABLET | ORAL | 4 refills | Status: DC

## 2021-11-08 NOTE — Patient Instructions (Signed)

## 2021-11-08 NOTE — Assessment & Plan Note (Signed)
BMI 38.48, praised for almost 20 pounds loss.  Recommended eating smaller high protein, low fat meals more frequently and exercising 30 mins a day 5 times a week with a goal of 10-15lb weight loss in the next 3 months. Patient voiced their understanding and motivation to adhere to these recommendations.

## 2021-11-08 NOTE — Assessment & Plan Note (Signed)
Chronic, ongoing.  Continue current medication regimen, Wellbutrin 300 MG daily.  PHQ9 today = 3.  Denies SI/HI.  She does not wish to adjust anything at this time.  Refills up to date.

## 2021-11-08 NOTE — Assessment & Plan Note (Signed)
Chronic, stable.  Continue current medication regimen and adjust as needed.  Lipid panel today. 

## 2021-11-08 NOTE — Assessment & Plan Note (Signed)
Chronic, ongoing with improved A1c today, 6.0%, trend down from 7.7% with weight loss and Metformin -- praised for her success. Recommend continued heavy focus on diet and exercise regimen.  Continue Metformin 1000 MG in morning and 500 MG at night, if continues to have stable A1c may reduce next visit.  Check BS three mornings a week and document.  Return in 3 months.

## 2021-11-08 NOTE — Assessment & Plan Note (Signed)
Chronic, continue to use CPAP 100% of the time. 

## 2021-11-08 NOTE — Assessment & Plan Note (Signed)
Chronic, ongoing with BP at goal today.  Recommend she monitor BP at least a few mornings a week at home and document.  DASH diet at home.  Continue current medication regimen and adjust as needed.  Labs today: CMP and urine ALB = 10.  Return in 3 months.

## 2021-11-08 NOTE — Progress Notes (Signed)
BP 115/75   Pulse 75   Temp 98 F (36.7 C) (Oral)   Wt 217 lb 3.2 oz (98.5 kg)   LMP  (LMP Unknown)   SpO2 98%   BMI 38.48 kg/m    Subjective:    Patient ID: Barbara Fitzpatrick, female    DOB: [birth year], 70 y.o.   MRN: [id number]  HPI: Barbara Fitzpatrick is a 70 y.o. female  Chief Complaint  Patient presents with   Diabetes    Patient states she has been keeping a log of her sugar readings at home. Patient states she and Reeve Mallo discussed if her readings were good they would discuss patient maybe coming off one of her current prescriptions. Patient state she is doing well.   Hyperlipidemia   Hypertension   Mood   DIABETES Diagnosed in August 2022 with A1c 7.7%.  Taking Metformin 1000 MG in morning and 500 MG at night. Has almost lost 20 pounds.  Has been working on diet changes.  She is taking daily B12. Hypoglycemic episodes:no Polydipsia/polyuria: no Visual disturbance: no Chest pain: no Paresthesias: no Glucose Monitoring: yes  Accucheck frequency: BID  Fasting glucose: 110 to 120 on her log  Post prandial: 80 to 130  Evening: 90 to 155  Before meals: Taking Insulin?: no  Long acting insulin:  Short acting insulin: Blood Pressure Monitoring: not checking Retinal Examination: Up to Date -- Dr. Delton Prairie at [facility] Exam: Up to Date Diabetic Education: Completed Pneumovax: Up to Date Influenza: Up to Date Aspirin: no   HYPERTENSION/HLD Continues on Accupril 40 MG + HCTZ 25 MG & Lipitor 80 MG + Zetia.  Does have CPAP and uses 100% of the time.   Calcium on recent labs slightly elevated, discussed with her.  She does take supplement daily. Hypertension status: controlled  Satisfied with current treatment? yes Duration of hypertension: chronic BP monitoring frequency:  not checking BP range:  BP medication side effects:  no Medication compliance: good compliance Previous BP meds: none Aspirin: yes Recurrent headaches: none Visual changes:  no Palpitations: no Dyspnea: no Chest pain: no Lower extremity edema: no Dizzy/lightheaded: no   DEPRESSION Reports good control with Wellbutrin. Mood status: controlled Satisfied with current treatment?: is satisfied with medicaiton Symptom severity: mild  Duration of current treatment : chronic Side effects: no Medication compliance: excellent compliance Psychotherapy/counseling: no never gone Previous psychiatric medications:none Depressed mood: no Anxious mood: no Anhedonia: no Significant weight loss or gain: yes Insomnia: no issues Fatigue: no Feelings of worthlessness or guilt: no Impaired concentration/indecisiveness: no Suicidal ideations: occasional, but no plans and no SI today Hopelessness: no Crying spells: none Depression screen [facility] 2/9 11/08/2021 09/07/2021 08/07/2021 01/25/2021 09/14/2020  Decreased Interest 0 0 0 0 0  Down, Depressed, Hopeless 1 1 1  0 0  PHQ - 2 Score 1 1 1  0 0  Altered sleeping 0 - 0 0 0  Tired, decreased energy 1 - 3 0 1  Change in appetite 0 - 3 0 0  Feeling bad or failure about yourself  0 - 1 0 0  Trouble concentrating 1 - 0 0 0  Moving slowly or fidgety/restless 0 - 0 0 1  Suicidal thoughts 0 - 1 0 0  PHQ-9 Score 3 - 9 0 2  Difficult doing work/chores - - Not difficult at all - Not difficult at all  Some recent data might be hidden    Relevant past medical, surgical, family and social history reviewed and updated as indicated.  Interim medical history since our last visit reviewed. Allergies and medications reviewed and updated.  Review of Systems  Constitutional:  Negative for activity change, appetite change, diaphoresis, fatigue and fever.  Respiratory:  Negative for cough, chest tightness and shortness of breath.   Cardiovascular:  Negative for chest pain, palpitations and leg swelling.  Gastrointestinal: Negative.   Endocrine: Negative for polydipsia, polyphagia and polyuria.  Neurological: Negative.    Psychiatric/Behavioral: Negative.     Per HPI unless specifically indicated above     Objective:    BP 115/75   Pulse 75   Temp 98 F (36.7 C) (Oral)   Wt 217 lb 3.2 oz (98.5 kg)   LMP  (LMP Unknown)   SpO2 98%   BMI 38.48 kg/m   Wt Readings from Last 3 Encounters:  11/08/21 217 lb 3.2 oz (98.5 kg)  09/06/21 233 lb (105.7 kg)  08/13/21 245 lb (111.1 kg)    Physical Exam Vitals and nursing note reviewed.  Constitutional:      General: She is awake. She is not in acute distress.    Appearance: She is well-developed and well-groomed. She is obese. She is not ill-appearing or toxic-appearing.  HENT:     Head: Normocephalic.     Right Ear: Hearing, tympanic membrane, ear canal and external ear normal.     Left Ear: Hearing, tympanic membrane, ear canal and external ear normal.  Eyes:     General: Lids are normal.        Right eye: No discharge.        Left eye: No discharge.     Conjunctiva/sclera: Conjunctivae normal.     Pupils: Pupils are equal, round, and reactive to light.  Neck:     Thyroid: No thyromegaly.     Vascular: No carotid bruit.  Cardiovascular:     Rate and Rhythm: Normal rate and regular rhythm.     Heart sounds: Normal heart sounds. No murmur heard.   No gallop.  Pulmonary:     Effort: Pulmonary effort is normal. No accessory muscle usage or respiratory distress.     Breath sounds: Normal breath sounds.  Abdominal:     General: Bowel sounds are normal.     Palpations: Abdomen is soft. There is no hepatomegaly or splenomegaly.  Musculoskeletal:     Cervical back: Normal range of motion and neck supple.     Right lower leg: No edema.     Left lower leg: No edema.  Lymphadenopathy:     Cervical: No cervical adenopathy.  Skin:    General: Skin is warm and dry.  Neurological:     Mental Status: She is alert and oriented to person, place, and time.  Psychiatric:        Attention and Perception: Attention normal.        Mood and Affect: Mood  normal.        Speech: Speech normal.        Behavior: Behavior normal. Behavior is cooperative.        Thought Content: Thought content normal.    Results for orders placed or performed in visit on 11/08/21  Bayer DCA Hb A1c Waived  Result Value Ref Range   HB A1C (BAYER DCA - WAIVED) 6.0 (H) 4.8 - 5.6 %  Microalbumin, Urine Waived  Result Value Ref Range   Microalb, Ur Waived 10 0 - 19 mg/L   Creatinine, Urine Waived 10 10 - 300 mg/dL   Microalb/Creat Ratio <30 <30  mg/g      Assessment & Plan:   Problem List Items Addressed This Visit       Cardiovascular and Mediastinum   Hypertension associated with type 2 diabetes mellitus ([place])    Chronic, ongoing with BP at goal today.  Recommend she monitor BP at least a few mornings a week at home and document.  DASH diet at home.  Continue current medication regimen and adjust as needed.  Labs today: CMP and urine ALB = 10.  Return in 3 months.       Relevant Medications   hydrochlorothiazide (HYDRODIURIL) 25 MG tablet   metFORMIN (GLUCOPHAGE) 500 MG tablet   Other Relevant Orders   Bayer DCA Hb A1c Waived (Completed)   Microalbumin, Urine Waived (Completed)   Comprehensive metabolic panel     Respiratory   OSA on CPAP    Chronic, continue to use CPAP 100% of the time.        Endocrine   Hyperlipidemia associated with type 2 diabetes mellitus (HCC)    Chronic, stable.  Continue current medication regimen and adjust as needed.  Lipid panel today.      Relevant Medications   hydrochlorothiazide (HYDRODIURIL) 25 MG tablet   metFORMIN (GLUCOPHAGE) 500 MG tablet   Other Relevant Orders   Bayer DCA Hb A1c Waived (Completed)   Lipid Panel w/o Chol/HDL Ratio   Type 2 diabetes mellitus with morbid obesity ([place]) - Primary    Chronic, ongoing with improved A1c today, 6.0%, trend down from 7.7% with weight loss and Metformin -- praised for her success. Recommend continued heavy focus on diet and exercise regimen.  Continue  Metformin 1000 MG in morning and 500 MG at night, if continues to have stable A1c may reduce next visit.  Check BS three mornings a week and document.  Return in 3 months.      Relevant Medications   metFORMIN (GLUCOPHAGE) 500 MG tablet   Other Relevant Orders   Bayer DCA Hb A1c Waived (Completed)   Microalbumin, Urine Waived (Completed)     Other   Depression, major, single episode, severe (HCC)    Chronic, ongoing.  Continue current medication regimen, Wellbutrin 300 MG daily.  PHQ9 today = 3.  Denies SI/HI.  She does not wish to adjust anything at this time.  Refills up to date.      Morbid obesity (HCC)    BMI 38.48, praised for almost 20 pounds loss.  Recommended eating smaller high protein, low fat meals more frequently and exercising 30 mins a day 5 times a week with a goal of 10-15lb weight loss in the next 3 months. Patient voiced their understanding and motivation to adhere to these recommendations.       Relevant Medications   metFORMIN (GLUCOPHAGE) 500 MG tablet   Other Visit Diagnoses     Serum calcium elevated       Check CMP and PTH today, she may need to reduce calcium to every other day.   Relevant Orders   PTH, Intact and Calcium   B12 deficiency       Check B12 level today and continue supplement.   Relevant Orders   B12        Follow up plan: Return in about 3 months (around 02/08/2022) for T2DM, HTN/HLD, MOOD.

## 2021-11-09 LAB — LIPID PANEL W/O CHOL/HDL RATIO
Cholesterol, Total: 92 mg/dL — ABNORMAL LOW (ref 100–199)
HDL: 32 mg/dL — ABNORMAL LOW (ref >39)
LDL Chol Calc (NIH): 39 mg/dL (ref 0–99)
Triglycerides: 113 mg/dL (ref 0–149)
VLDL Cholesterol Cal: 21 mg/dL (ref 5–40)

## 2021-11-09 LAB — PTH, INTACT AND CALCIUM: PTH: 24 pg/mL (ref 15–65)

## 2021-11-09 LAB — COMPREHENSIVE METABOLIC PANEL
ALT: 30 IU/L (ref 0–32)
AST: 23 IU/L (ref 0–40)
Albumin/Globulin Ratio: 1.8 (ref 1.2–2.2)
Albumin: 4.1 g/dL (ref 3.8–4.8)
Alkaline Phosphatase: 91 IU/L (ref 44–121)
BUN/Creatinine Ratio: 10 — ABNORMAL LOW (ref 12–28)
BUN: 8 mg/dL (ref 8–27)
Bilirubin Total: 0.5 mg/dL (ref 0.0–1.2)
CO2: 27 mmol/L (ref 20–29)
Calcium: 10.1 mg/dL (ref 8.7–10.3)
Chloride: 102 mmol/L (ref 96–106)
Creatinine, Ser: 0.8 mg/dL (ref 0.57–1.00)
Globulin, Total: 2.3 g/dL (ref 1.5–4.5)
Glucose: 115 mg/dL — ABNORMAL HIGH (ref 70–99)
Potassium: 4 mmol/L (ref 3.5–5.2)
Sodium: 143 mmol/L (ref 134–144)
Total Protein: 6.4 g/dL (ref 6.0–8.5)
eGFR: 80 mL/min/{1.73_m2} (ref >59)

## 2021-11-09 LAB — VITAMIN B12: Vitamin B-12: 1725 pg/mL — ABNORMAL HIGH (ref 232–1245)

## 2021-11-09 NOTE — Progress Notes (Signed)
Contacted via MyChart   Good evening Barbara Fitzpatrick, your labs have returned: - Kidney function, creatinine and eGFR, is normal.  Liver function, AST and ALT, is also normal. - Cholesterol levels are fantastic -- continue Atorvastatin. - Parathyroid level is normal - B12 is above normal, if taking supplement then change to every other day.  Any questions? Keep being amazing!!  Thank you for allowing me to participate in your care.  I appreciate you. Kindest regards, Jolene 

## 2021-11-13 DIAGNOSIS — J3089 Other allergic rhinitis: Secondary | ICD-10-CM | POA: Diagnosis not present

## 2021-11-20 DIAGNOSIS — J3089 Other allergic rhinitis: Secondary | ICD-10-CM | POA: Diagnosis not present

## 2021-11-27 DIAGNOSIS — J3089 Other allergic rhinitis: Secondary | ICD-10-CM | POA: Diagnosis not present

## 2021-12-04 DIAGNOSIS — J3089 Other allergic rhinitis: Secondary | ICD-10-CM | POA: Diagnosis not present

## 2021-12-11 DIAGNOSIS — J3089 Other allergic rhinitis: Secondary | ICD-10-CM | POA: Diagnosis not present

## 2021-12-18 DIAGNOSIS — J3089 Other allergic rhinitis: Secondary | ICD-10-CM | POA: Diagnosis not present

## 2021-12-25 DIAGNOSIS — J3089 Other allergic rhinitis: Secondary | ICD-10-CM | POA: Diagnosis not present

## 2021-12-28 DIAGNOSIS — J3089 Other allergic rhinitis: Secondary | ICD-10-CM | POA: Diagnosis not present

## 2021-12-28 DIAGNOSIS — H1045 Other chronic allergic conjunctivitis: Secondary | ICD-10-CM | POA: Diagnosis not present

## 2022-01-01 DIAGNOSIS — J3089 Other allergic rhinitis: Secondary | ICD-10-CM | POA: Diagnosis not present

## 2022-01-08 DIAGNOSIS — J3089 Other allergic rhinitis: Secondary | ICD-10-CM | POA: Diagnosis not present

## 2022-01-15 DIAGNOSIS — J301 Allergic rhinitis due to pollen: Secondary | ICD-10-CM | POA: Diagnosis not present

## 2022-01-15 DIAGNOSIS — J3089 Other allergic rhinitis: Secondary | ICD-10-CM | POA: Diagnosis not present

## 2022-01-20 ENCOUNTER — Other Ambulatory Visit: Payer: Self-pay | Admitting: Nurse Practitioner

## 2022-01-20 NOTE — Telephone Encounter (Signed)
Appointment 02/05/22 Requested Prescriptions  Pending Prescriptions Disp Refills   ezetimibe (ZETIA) 10 MG tablet [Pharmacy Med Name: EZETIMIBE 10MG  TABLETS] 90 tablet 0    Sig: TAKE 1 TABLET(10 MG) BY MOUTH DAILY     Cardiovascular:  Antilipid - Sterol Transport Inhibitors Failed - 01/20/2022 11:03 AM      Failed - Total Cholesterol in normal range and within 360 days    Cholesterol, Total  Date Value Ref Range Status  11/08/2021 92 (L) 100 - 199 mg/dL Final   Cholesterol Piccolo, Waived  Date Value Ref Range Status  03/16/2020 172 <200 mg/dL Final    Comment:                            Desirable                <200                         Borderline High      200- 239                         High                     >239          Failed - HDL in normal range and within 360 days    HDL  Date Value Ref Range Status  11/08/2021 32 (L) >39 mg/dL Final         Passed - LDL in normal range and within 360 days    LDL Chol Calc (NIH)  Date Value Ref Range Status  11/08/2021 39 0 - 99 mg/dL Final         Passed - Triglycerides in normal range and within 360 days    Triglycerides  Date Value Ref Range Status  11/08/2021 113 0 - 149 mg/dL Final   Triglycerides Piccolo,Waived  Date Value Ref Range Status  03/16/2020 222 (H) <150 mg/dL Final    Comment:                            Normal                   <150                         Borderline High     150 - 199                         High                200 - 499                         Very High                >499          Passed - Valid encounter within last 12 months    Recent Outpatient Visits          2 months ago Type 2 diabetes mellitus with morbid obesity ([place])   [place], Henrine Screws T, NP   4 months ago Flu vaccine need   [place], Barbaraann Faster, NP  5 months ago Depression, major, single episode, severe ([place])   [place] Cannady, Jolene T, NP   12  months ago Depression, major, single episode, severe ([place])   [place], Barbaraann Faster, NP   1 year ago Needs flu shot   [facility] Kathrine Haddock, NP      Future Appointments            In 2 weeks Cannady, Barbaraann Faster, NP [facility] [place]   In 2 weeks [place], Barbaraann Faster, NP [facility]

## 2022-01-22 DIAGNOSIS — J3089 Other allergic rhinitis: Secondary | ICD-10-CM | POA: Diagnosis not present

## 2022-01-29 DIAGNOSIS — J3089 Other allergic rhinitis: Secondary | ICD-10-CM | POA: Diagnosis not present

## 2022-02-03 NOTE — Patient Instructions (Signed)

## 2022-02-05 ENCOUNTER — Ambulatory Visit (INDEPENDENT_AMBULATORY_CARE_PROVIDER_SITE_OTHER): Payer: Medicare PPO | Admitting: Nurse Practitioner

## 2022-02-05 ENCOUNTER — Other Ambulatory Visit: Payer: Self-pay

## 2022-02-05 ENCOUNTER — Encounter: Payer: Self-pay | Admitting: Nurse Practitioner

## 2022-02-05 DIAGNOSIS — K219 Gastro-esophageal reflux disease without esophagitis: Secondary | ICD-10-CM | POA: Diagnosis not present

## 2022-02-05 DIAGNOSIS — E1159 Type 2 diabetes mellitus with other circulatory complications: Secondary | ICD-10-CM | POA: Diagnosis not present

## 2022-02-05 DIAGNOSIS — R0989 Other specified symptoms and signs involving the circulatory and respiratory systems: Secondary | ICD-10-CM

## 2022-02-05 DIAGNOSIS — E1169 Type 2 diabetes mellitus with other specified complication: Secondary | ICD-10-CM

## 2022-02-05 DIAGNOSIS — F322 Major depressive disorder, single episode, severe without psychotic features: Secondary | ICD-10-CM

## 2022-02-05 DIAGNOSIS — M8588 Other specified disorders of bone density and structure, other site: Secondary | ICD-10-CM | POA: Diagnosis not present

## 2022-02-05 DIAGNOSIS — E785 Hyperlipidemia, unspecified: Secondary | ICD-10-CM

## 2022-02-05 DIAGNOSIS — G4733 Obstructive sleep apnea (adult) (pediatric): Secondary | ICD-10-CM | POA: Diagnosis not present

## 2022-02-05 DIAGNOSIS — Z0000 Encounter for general adult medical examination without abnormal findings: Secondary | ICD-10-CM

## 2022-02-05 DIAGNOSIS — J3089 Other allergic rhinitis: Secondary | ICD-10-CM | POA: Diagnosis not present

## 2022-02-05 DIAGNOSIS — I152 Hypertension secondary to endocrine disorders: Secondary | ICD-10-CM

## 2022-02-05 DIAGNOSIS — F5101 Primary insomnia: Secondary | ICD-10-CM

## 2022-02-05 DIAGNOSIS — Z1211 Encounter for screening for malignant neoplasm of colon: Secondary | ICD-10-CM

## 2022-02-05 DIAGNOSIS — Z9989 Dependence on other enabling machines and devices: Secondary | ICD-10-CM

## 2022-02-05 LAB — MICROALBUMIN, URINE WAIVED
Creatinine, Urine Waived: 50 mg/dL (ref 10–300)
Microalb, Ur Waived: 10 mg/L (ref 0–19)

## 2022-02-05 LAB — BAYER DCA HB A1C WAIVED: HB A1C (BAYER DCA - WAIVED): 5.5 % (ref 4.8–5.6)

## 2022-02-05 NOTE — Assessment & Plan Note (Signed)
Chronic, continue to use CPAP 100% of the time. 

## 2022-02-05 NOTE — Progress Notes (Signed)
BP 134/78 (BP Location: Left Arm)    Pulse 77    Temp 98.2 F (36.8 C) (Oral)    Ht 5' 3.75" (1.619 m)    Wt 212 lb 3.2 oz (96.3 kg)    LMP  (LMP Unknown)    SpO2 97%    BMI 36.71 kg/m    Subjective:    Patient ID: Barbara Fitzpatrick, female    DOB: 1951-01-01, 71 y.o.   MRN: [id number]  NOTE WRITTEN BY [employer] DNP STUDENT.  ASSESSMENT AND PLAN OF CARE REVIEWED WITH STUDENT, AGREE WITH ABOVE FINDINGS AND PLAN.   HPI: Barbara Fitzpatrick is a 71 y.o. female presenting on 02/05/2022 for comprehensive medical examination. She does not have any current medical complaints today.  She currently lives with: husband Menopausal Symptoms: no    DIABETES Diagnosed in August 2022 with A1c 7.7% .  Taking Metformin 1000 MG in morning and 500 MG at night. Has almost lost 30 pounds. Has been working on diet changes.  She is taking daily B12.  A1c today 5.5%.  Hypoglycemic episodes:no Polydipsia/polyuria: no Visual disturbance: no Chest pain: no Paresthesias: no Glucose Monitoring: yes             Accucheck frequency: Once daily             Fasting glucose: 108-132 on her log              Post prandial:              Evening:              Before meals: Taking Insulin?: no             Long acting insulin:             Short acting insulin: Blood Pressure Monitoring: not checking Retinal Examination: Up to Date -- Dr. Delton Prairie at [facility] Exam: Up to Date Diabetic Education: Completed Pneumovax: Up to Date Influenza: Up to Date Aspirin: no    HYPERTENSION/HLD Continues on Accupril 40 MG + HCTZ 25 MG & Lipitor 80 MG + Zetia.  Does have CPAP and uses 100% of the time.  Hypertension status: controlled  Satisfied with current treatment? yes Duration of hypertension: chronic BP monitoring frequency:  not checking BP range:  BP medication side effects:  no Medication compliance: good compliance Previous BP meds: none Aspirin: yes Recurrent headaches: none Visual changes: no Palpitations:  no Dyspnea: no Chest pain: no Lower extremity edema: no Dizzy/lightheaded: no    DEPRESSION Reports good control with Wellbutrin. Mood status: controlled Satisfied with current treatment?: is satisfied with medicaiton Symptom severity: mild  Duration of current treatment : chronic Side effects: no Medication compliance: excellent compliance Psychotherapy/counseling: no never gone Previous psychiatric medications:none Depressed mood: no Anxious mood: no Anhedonia: no Significant weight loss or gain: yes Insomnia: does endorse issues with sleeping, no supplements taken Fatigue: no Feelings of worthlessness or guilt: no Impaired concentration/indecisiveness: no Suicidal ideations: none Hopelessness: no Crying spells: none Depression screen [facility] 2/9 02/05/2022 11/08/2021 09/07/2021 08/07/2021 01/25/2021  Decreased Interest 0 0 0 0 0  Down, Depressed, Hopeless '1 1 1 1 ' 0  PHQ - 2 Score '1 1 1 1 ' 0  Altered sleeping 1 0 - 0 0  Tired, decreased energy 1 1 - 3 0  Change in appetite 1 0 - 3 0  Feeling bad or failure about yourself  0 0 - 1 0  Trouble concentrating 0 1 - 0  0  Moving slowly or fidgety/restless 0 0 - 0 0  Suicidal thoughts 0 0 - 1 0  PHQ-9 Score 4 3 - 9 0  Difficult doing work/chores Not difficult at all - - Not difficult at all -  Some recent data might be hidden    The patient does not have a history of falls. I did not complete a risk assessment for falls.    Past Medical History:  Past Medical History:  Diagnosis Date   Arthritis    Depression    Diabetes ([place])    GERD (gastroesophageal reflux disease)    Hyperlipidemia    Hypertension    Personal history of colonic adenomas 06/16/2013   4 adenomas (max 1 cm)  04/2003  no polyps 04/2005    Sleep apnea    uses c-pap    Surgical History:  Past Surgical History:  Procedure Laterality Date   ABDOMINAL HYSTERECTOMY     COLONOSCOPY     hysterectomy     with bladder tack   TUBAL LIGATION      Medications:   Current Outpatient Medications on File Prior to Visit  Medication Sig   acyclovir ointment (ZOVIRAX) 5 % Apply 1 application topically 3 (three) times daily as needed.   aluminum chloride (DRYSOL) 20 % external solution Apply topically at bedtime.   aspirin 81 MG tablet Take 81 mg by mouth daily.   atorvastatin (LIPITOR) 80 MG tablet Take 1 tablet (80 mg total) by mouth daily.   Blood Glucose Monitoring Suppl (ONETOUCH VERIO) w/Device KIT Use to check blood sugar 3 times daily and document for visits.  Bring log to visits.   buPROPion (WELLBUTRIN XL) 300 MG 24 hr tablet TAKE 1 TABLET(300 MG) BY MOUTH DAILY   cetirizine (ZYRTEC) 10 MG tablet Take 10 mg by mouth daily.   Cholecalciferol (VITAMIN D-3 PO) Take 3,000 Units by mouth daily.    Coenzyme Q10 (CO Q-10 PO) Take by mouth daily.   EPIPEN 2-PAK 0.3 MG/0.3ML SOAJ injection as needed.    ezetimibe (ZETIA) 10 MG tablet TAKE 1 TABLET(10 MG) BY MOUTH DAILY   glucosamine-chondroitin 500-400 MG tablet Take 1 tablet by mouth 2 (two) times daily.   glucose blood test strip Use to check blood sugar 3 times daily and document for visits.  Bring log to visits.   hydrochlorothiazide (HYDRODIURIL) 25 MG tablet TAKE 1 TABLET(25 MG) BY MOUTH DAILY   ipratropium (ATROVENT) 0.06 % nasal spray Place into both nostrils.   Magnesium 250 MG TABS Take by mouth daily.    metFORMIN (GLUCOPHAGE) 500 MG tablet Taking 1000 MG (two tablets) in the morning by mouth and 500 MG (one tablet) by mouth in the evening.   Methylcellulose, Laxative, (FIBER THERAPY PO) Take 1 tablet by mouth daily.   Multiple Vitamins-Minerals (MULTIVITAMIN PO) Take by mouth daily.   nystatin (NYSTATIN) powder Apply topically 3 (three) times daily as needed.   nystatin cream (MYCOSTATIN) Apply 1 application topically 2 (two) times daily.   Olopatadine HCl 0.6 % SOLN U 2 SPRAYS IEN BID   omeprazole (PRILOSEC) 20 MG capsule Take 20 mg by mouth daily.   OneTouch Delica Lancets 01B MISC Use to  check blood sugar 3 times daily and document for visits.  Bring log to visits.   Probiotic Product (PROBIOTIC COLON SUPPORT PO) Take by mouth. Take one pill every other day   quinapril (ACCUPRIL) 40 MG tablet TAKE 1 TABLET(40 MG) BY MOUTH AT BEDTIME   valACYclovir (VALTREX)  1000 MG tablet Take one tablet twice daily at the first sign of a flare for about 3-5 days   vitamin B-12 (CYANOCOBALAMIN) 1000 MCG tablet Take 1,000 mcg by mouth daily.   vitamin E 1000 UNIT capsule Take 1,000 Units by mouth daily.   fexofenadine (ALLEGRA) 180 MG tablet 1 tablet (Patient not taking: Reported on 11/08/2021)   No current facility-administered medications on file prior to visit.    Allergies:  No Known Allergies  Social History:  Social History   Socioeconomic History   Marital status: Single    Spouse name: Not on file   Number of children: Not on file   Years of education: Not on file   Highest education level: 12th grade  Occupational History   Occupation: retired  Tobacco Use   Smoking status: Never   Smokeless tobacco: Never  Vaping Use   Vaping Use: Never used  Substance and Sexual Activity   Alcohol use: No   Drug use: No   Sexual activity: Not Currently  Other Topics Concern   Not on file  Social History Narrative   Not on file   Social Determinants of Health   Financial Resource Strain: Low Risk    Difficulty of Paying Living Expenses: Not very hard  Food Insecurity: Food Insecurity Present   Worried About Running Out of Food in the Last Year: Never true   Ran Out of Food in the Last Year: Sometimes true  Transportation Needs: No Transportation Needs   Lack of Transportation (Medical): No   Lack of Transportation (Non-Medical): No  Physical Activity: Insufficiently Active   Days of Exercise per Week: 2 days   Minutes of Exercise per Session: 30 min  Stress: No Stress Concern Present   Feeling of Stress : Only a little  Social Connections: Moderately Isolated    Frequency of Communication with Friends and Family: More than three times a week   Frequency of Social Gatherings with Friends and Family: More than three times a week   Attends Religious Services: More than 4 times per year   Active Member of [employer] or Organizations: No   Attends Archivist Meetings: Never   Marital Status: Widowed  Human resources officer Violence: Not At Risk   Fear of Current or Ex-Partner: No   Emotionally Abused: No   Physically Abused: No   Sexually Abused: No   Social History   Tobacco Use  Smoking Status Never  Smokeless Tobacco Never   Social History   Substance and Sexual Activity  Alcohol Use No    Family History:  Family History  Problem Relation Age of Onset   Hypertension Mother    Hyperlipidemia Mother    Diabetes Mother    Cancer Mother        colon (benign)   Glaucoma Mother    Heart disease Father    Hypertension Father    Hyperlipidemia Father    Diabetes Father    Diabetes Sister    Lung disease Sister        polyps   Cancer Brother        kidney   Glaucoma Brother    Hyperlipidemia Brother    Hypertension Brother    Stroke Maternal Grandfather    Diabetes Paternal Grandmother    Heart disease Paternal Grandfather    Cancer Maternal Aunt 50       ovarian   Colon cancer Neg Hx    Esophageal cancer Neg Hx    Rectal  cancer Neg Hx    Stomach cancer Neg Hx     Past medical history, surgical history, medications, allergies, family history and social history reviewed with patient today and changes made to appropriate areas of the chart.   Review of Systems  Constitutional:  Positive for weight loss. Negative for chills, diaphoresis, fever and malaise/fatigue.  HENT:  Negative for congestion, ear discharge, ear pain, hearing loss and sinus pain.   Eyes:  Negative for blurred vision, double vision, photophobia and pain.  Respiratory:  Negative for cough, shortness of breath and wheezing.   Cardiovascular:  Negative for  chest pain, palpitations, orthopnea and leg swelling.  Gastrointestinal:  Positive for heartburn. Negative for abdominal pain, constipation, diarrhea, nausea and vomiting.  Genitourinary:  Negative for dysuria, frequency and urgency.  Musculoskeletal:  Negative for falls and myalgias.  Skin:  Negative for itching and rash.  Neurological:  Negative for dizziness, tingling, speech change, focal weakness and headaches.  Endo/Heme/Allergies:  Negative for polydipsia.  Psychiatric/Behavioral:  Negative for depression and suicidal ideas. The patient is not nervous/anxious.    All other ROS negative except what is listed above and in the HPI.      Objective:    BP 134/78 (BP Location: Left Arm)    Pulse 77    Temp 98.2 F (36.8 C) (Oral)    Ht 5' 3.75" (1.619 m)    Wt 212 lb 3.2 oz (96.3 kg)    LMP  (LMP Unknown)    SpO2 97%    BMI 36.71 kg/m   Wt Readings from Last 3 Encounters:  02/05/22 212 lb 3.2 oz (96.3 kg)  11/08/21 217 lb 3.2 oz (98.5 kg)  09/06/21 233 lb (105.7 kg)    Physical Exam Constitutional:      General: She is not in acute distress.    Appearance: Normal appearance. She is well-groomed and normal weight. She is not ill-appearing.  HENT:     Head: Normocephalic.     Salivary Glands: Right salivary gland is not diffusely enlarged or tender. Left salivary gland is not diffusely enlarged or tender.     Right Ear: Tympanic membrane normal.     Left Ear: Tympanic membrane normal.     Nose: No congestion.     Mouth/Throat:     Mouth: Mucous membranes are moist.     Pharynx: No oropharyngeal exudate or posterior oropharyngeal erythema.  Eyes:     General: Lids are normal. No scleral icterus.       Right eye: No discharge.        Left eye: No discharge.     Conjunctiva/sclera: Conjunctivae normal.     Right eye: Right conjunctiva is not injected. No exudate.    Left eye: Left conjunctiva is not injected. No exudate.    Pupils: Pupils are equal, round, and reactive to light.   Neck:     Thyroid: No thyromegaly or thyroid tenderness.     Vascular: Carotid bruit present.  Cardiovascular:     Rate and Rhythm: Normal rate and regular rhythm.     Pulses: Normal pulses.          Dorsalis pedis pulses are 2+ on the right side and 2+ on the left side.       Posterior tibial pulses are 2+ on the right side and 2+ on the left side.     Heart sounds: Normal heart sounds. No murmur heard. Pulmonary:     Effort: Pulmonary effort is normal. No  accessory muscle usage or respiratory distress.     Breath sounds: Normal breath sounds. No wheezing, rhonchi or rales.  Chest:     Chest wall: No swelling or tenderness.  Breasts:    Right: No swelling, mass, nipple discharge, skin change or tenderness.     Left: Normal. No swelling, mass, nipple discharge, skin change or tenderness.  Abdominal:     General: Abdomen is protuberant. Bowel sounds are normal. There is no distension.     Palpations: Abdomen is soft.     Tenderness: There is no abdominal tenderness. There is no right CVA tenderness or left CVA tenderness.  Musculoskeletal:        General: No swelling or tenderness.     Cervical back: Normal range of motion. No edema, erythema or tenderness. No pain with movement or muscular tenderness.     Right lower leg: No edema.     Left lower leg: No edema.     Right foot: Normal range of motion.     Left foot: Normal range of motion.  Feet:     Right foot:     Protective Sensation: 8 sites tested.  8 sites sensed.     Skin integrity: Dry skin present. No ulcer, blister, skin breakdown, erythema or warmth.     Toenail Condition: Right toenails are normal.     Left foot:     Protective Sensation: 8 sites tested.  8 sites sensed.     Skin integrity: Dry skin present. No ulcer, blister, skin breakdown, erythema or warmth.     Toenail Condition: Left toenails are normal.  Lymphadenopathy:     Head:     Right side of head: No submental, submandibular, tonsillar, preauricular,  posterior auricular or occipital adenopathy.     Left side of head: No submental, submandibular, tonsillar, preauricular, posterior auricular or occipital adenopathy.     Cervical: No cervical adenopathy.  Skin:    General: Skin is warm and dry.     Findings: No erythema, rash or wound.  Neurological:     General: No focal deficit present.     Mental Status: She is alert and oriented to person, place, and time.     Motor: No weakness.     Deep Tendon Reflexes: Reflexes normal.  Psychiatric:        Attention and Perception: Attention normal.        Mood and Affect: Mood and affect normal. Mood is not anxious or depressed.        Speech: Speech normal.        Behavior: Behavior normal. Behavior is cooperative.        Thought Content: Thought content normal.        Judgment: Judgment normal.    Results for orders placed or performed in visit on 11/08/21  Bayer DCA Hb A1c Waived  Result Value Ref Range   HB A1C (BAYER DCA - WAIVED) 6.0 (H) 4.8 - 5.6 %  Microalbumin, Urine Waived  Result Value Ref Range   Microalb, Ur Waived 10 0 - 19 mg/L   Creatinine, Urine Waived 10 10 - 300 mg/dL   Microalb/Creat Ratio <30 <30 mg/g  Comprehensive metabolic panel  Result Value Ref Range   Glucose 115 (H) 70 - 99 mg/dL   BUN 8 8 - 27 mg/dL   Creatinine, Ser 0.80 0.57 - 1.00 mg/dL   eGFR 80 >59 mL/min/1.73   BUN/Creatinine Ratio 10 (L) 12 - 28   Sodium 143 134 -  144 mmol/L   Potassium 4.0 3.5 - 5.2 mmol/L   Chloride 102 96 - 106 mmol/L   CO2 27 20 - 29 mmol/L   Calcium 10.1 8.7 - 10.3 mg/dL   Total Protein 6.4 6.0 - 8.5 g/dL   Albumin 4.1 3.8 - 4.8 g/dL   Globulin, Total 2.3 1.5 - 4.5 g/dL   Albumin/Globulin Ratio 1.8 1.2 - 2.2   Bilirubin Total 0.5 0.0 - 1.2 mg/dL   Alkaline Phosphatase 91 44 - 121 IU/L   AST 23 0 - 40 IU/L   ALT 30 0 - 32 IU/L  Lipid Panel w/o Chol/HDL Ratio  Result Value Ref Range   Cholesterol, Total 92 (L) 100 - 199 mg/dL   Triglycerides 113 0 - 149 mg/dL   HDL  32 (L) >39 mg/dL   VLDL Cholesterol Cal 21 5 - 40 mg/dL   LDL Chol Calc (NIH) 39 0 - 99 mg/dL  PTH, Intact and Calcium  Result Value Ref Range   PTH 24 15 - 65 pg/mL   PTH Interp Comment   B12  Result Value Ref Range   Vitamin B-12 1,725 (H) 232 - 1,245 pg/mL      Assessment & Plan:   Problem List Items Addressed This Visit       Cardiovascular and Mediastinum   Hypertension associated with type 2 diabetes mellitus (HCC)    Chronic, ongoing with BP at goal today.  Recommend she monitor BP at least a few mornings a week at home and document.  DASH diet at home.  Continue current medication regimen.  Labs today: CMP and urine ALB = 10.  Return in 6 months.       Relevant Orders   Bayer DCA Hb A1c Waived   Microalbumin, Urine Waived   Comprehensive metabolic panel   CBC with Differential/Platelet     Respiratory   OSA on CPAP    Chronic, continue to use CPAP 100% of the time.        Digestive   GERD (gastroesophageal reflux disease)    Chronic, stable. Continue current PPI dose.  Mag level today.      Relevant Orders   Magnesium     Endocrine   Hyperlipidemia associated with type 2 diabetes mellitus (HCC)    Chronic, stable. Continue current medication regimen and adjust as needed.  Lipid panel ordered today.      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 Recommend continued heavy focus on diet and exercise regimen.  Continue Metformin 1000 MG in morning and 500 MG at night, if continues to have stable A1c may reduce next visit.  Check BS three mornings a week and document.  Return in 6 months.      Relevant Orders   Bayer DCA Hb A1c Waived   Microalbumin, Urine Waived   Comprehensive metabolic panel      Musculoskeletal and Integument   Osteopenia    Chronic, continue daily supplements. DEXA repeat in 2024.  Vitamin D level ordered today.      Relevant Orders   VITAMIN D 25 Hydroxy (Vit-D Deficiency, Fractures)     Other   Bilateral carotid bruits    Was referred to vascular  for further evaluation and recommendations. No evidence of stenosis in the right or left ICA reported on carotid US performed on 01/21/2020.      Depression, major, single episode, severe (HCC)    Chronic, ongoing.  Continue current medication regimen, Wellbutrin 300 MG daily.  PHQ9 today = 4. States mood has improved. Denies SI/HI -- is aware to notify provider immediately if SI presents or plan.  No medication adjustment at this time.  Return in 6 months.       Morbid obesity ([place])    Today BMI is 36.71 last BMI 42. Trending down. Patient has lost an additional 2.2 kg since last  Visit. Continue with current diet and exercise regimen.       Primary insomnia    Patient states she occasionally has difficulty sleeping. Recommend she try otc Melatonin to help falling asleep. Follow instruction on label. Notify PCP is insomnia worsens.        Follow up plan: Return in about 6 months (around 08/05/2022) for T2DM, HTN/HLD.   LABORATORY TESTING:  - Pap smear: not applicable  IMMUNIZATIONS:   - Tdap: Tetanus vaccination status reviewed: last tetanus booster within 10 years. - Influenza: Up to date - Pneumovax: Up to date - Prevnar: Up to date - COVID: Up to date - HPV: Not applicable - Shingrix vaccine: Up to date  SCREENING: -Mammogram: Up to date  - Colonoscopy: Ordered today  - Bone Density:  up to date == osteopenia, due next 07/23/2023   -Hearing Test:  no applicable   -Spirometry: Not applicable   PATIENT COUNSELING:    Diet: Encouraged to adjust caloric intake to maintain or achieve ideal body weight, to reduce intake of dietary saturated fat and total fat, to limit sodium intake by avoiding high  sodium foods and not adding table salt, and to maintain adequate dietary potassium and calcium preferably from fresh fruits, vegetables, and low-fat dairy products.    Stressed the importance of regular exercise  Injury prevention: Discussed safety belts, safety helmets, smoke detector, smoking near bedding or upholstery.   Dental health: Discussed importance of regular tooth brushing, flossing, and dental visits.    NEXT PREVENTATIVE PHYSICAL DUE IN 1 YEAR. Return in about 6 months (around 08/05/2022) for T2DM, HTN/HLD.

## 2022-02-05 NOTE — Assessment & Plan Note (Signed)
Chronic, ongoing.  Continue current medication regimen, Wellbutrin 300 MG daily.  PHQ9 today = 4. States mood has improved. Denies SI/HI -- is aware to notify provider immediately if SI presents or plan.  No medication adjustment at this time.  Return in 6 months.

## 2022-02-05 NOTE — Assessment & Plan Note (Signed)
Chronic, stable. Continue current PPI dose.  Mag level today.

## 2022-02-05 NOTE — Assessment & Plan Note (Signed)
Chronic, ongoing with improved A1c today, 5.5%, trend down from 7.7% with weight loss and Metformin -- praised for her success. Recommend continued heavy focus on diet and exercise regimen.  Continue Metformin 1000 MG in morning and 500 MG at night, if continues to have stable A1c may reduce next visit.  Check BS three mornings a week and document.  Return in 6 months.

## 2022-02-05 NOTE — Assessment & Plan Note (Signed)
Today BMI is 36.71 last BMI 42. Trending down. Patient has lost an additional 2.2 kg since last  Visit. Continue with current diet and exercise regimen.

## 2022-02-05 NOTE — Assessment & Plan Note (Signed)
Chronic, continue daily supplements. DEXA repeat in 2024.  Vitamin D level ordered today.

## 2022-02-05 NOTE — Assessment & Plan Note (Addendum)
Was referred to vascular for further evaluation and recommendations. No evidence of stenosis in the right or left ICA reported on carotid US performed on 01/21/2020.

## 2022-02-05 NOTE — Addendum Note (Signed)
Addended by: Marnee Guarneri T on: 02/05/2022 01:23 PM   Modules accepted: Orders

## 2022-02-05 NOTE — Assessment & Plan Note (Signed)
Chronic, ongoing with BP at goal today.  Recommend she monitor BP at least a few mornings a week at home and document.  DASH diet at home.  Continue current medication regimen.  Labs today: CMP and urine ALB = 10.  Return in 6 months.

## 2022-02-05 NOTE — Assessment & Plan Note (Signed)
Chronic, stable. Continue current medication regimen and adjust as needed.  Lipid panel ordered today.

## 2022-02-05 NOTE — Assessment & Plan Note (Signed)
Patient states she occasionally has difficulty sleeping. Recommend she try otc Melatonin to help falling asleep. Follow instruction on label. Notify PCP is insomnia worsens.

## 2022-02-06 ENCOUNTER — Telehealth: Payer: Self-pay

## 2022-02-06 LAB — LIPID PANEL W/O CHOL/HDL RATIO
Cholesterol, Total: 103 mg/dL (ref 100–199)
HDL: 35 mg/dL — ABNORMAL LOW (ref >39)
LDL Chol Calc (NIH): 45 mg/dL (ref 0–99)
Triglycerides: 132 mg/dL (ref 0–149)
VLDL Cholesterol Cal: 23 mg/dL (ref 5–40)

## 2022-02-06 LAB — COMPREHENSIVE METABOLIC PANEL
ALT: 26 IU/L (ref 0–32)
AST: 21 IU/L (ref 0–40)
Albumin/Globulin Ratio: 2.2 (ref 1.2–2.2)
Albumin: 4.2 g/dL (ref 3.8–4.8)
Alkaline Phosphatase: 84 IU/L (ref 44–121)
BUN/Creatinine Ratio: 14 (ref 12–28)
BUN: 11 mg/dL (ref 8–27)
Bilirubin Total: 0.5 mg/dL (ref 0.0–1.2)
CO2: 27 mmol/L (ref 20–29)
Calcium: 10 mg/dL (ref 8.7–10.3)
Chloride: 101 mmol/L (ref 96–106)
Creatinine, Ser: 0.8 mg/dL (ref 0.57–1.00)
Globulin, Total: 1.9 g/dL (ref 1.5–4.5)
Glucose: 112 mg/dL — ABNORMAL HIGH (ref 70–99)
Potassium: 3.8 mmol/L (ref 3.5–5.2)
Sodium: 140 mmol/L (ref 134–144)
Total Protein: 6.1 g/dL (ref 6.0–8.5)
eGFR: 80 mL/min/{1.73_m2} (ref >59)

## 2022-02-06 LAB — CBC WITH DIFFERENTIAL/PLATELET
Basophils Absolute: 0 10*3/uL (ref 0.0–0.2)
Basos: 0 %
EOS (ABSOLUTE): 0.1 10*3/uL (ref 0.0–0.4)
Eos: 1 %
Hematocrit: 44.3 % (ref 34.0–46.6)
Hemoglobin: 14.8 g/dL (ref 11.1–15.9)
Immature Grans (Abs): 0 10*3/uL (ref 0.0–0.1)
Immature Granulocytes: 0 %
Lymphocytes Absolute: 2.5 10*3/uL (ref 0.7–3.1)
Lymphs: 26 %
MCH: 30.7 pg (ref 26.6–33.0)
MCHC: 33.4 g/dL (ref 31.5–35.7)
MCV: 92 fL (ref 79–97)
Monocytes Absolute: 0.5 10*3/uL (ref 0.1–0.9)
Monocytes: 5 %
Neutrophils Absolute: 6.2 10*3/uL (ref 1.4–7.0)
Neutrophils: 68 %
Platelets: 275 10*3/uL (ref 150–450)
RBC: 4.82 x10E6/uL (ref 3.77–5.28)
RDW: 12.4 % (ref 11.7–15.4)
WBC: 9.4 10*3/uL (ref 3.4–10.8)

## 2022-02-06 LAB — VITAMIN D 25 HYDROXY (VIT D DEFICIENCY, FRACTURES): Vit D, 25-Hydroxy: 53 ng/mL (ref 30.0–100.0)

## 2022-02-06 LAB — MAGNESIUM: Magnesium: 2 mg/dL (ref 1.6–2.3)

## 2022-02-06 NOTE — Telephone Encounter (Signed)
CALLED PATIENT NO ANSWER LEFT VOICEMAIL FOR A CALL BACK ? ?

## 2022-02-06 NOTE — Progress Notes (Signed)
Contacted via [facility], your labs have returned and overall look FANTASTIC!!!  Super proud of you!! Kidney function, creatinine and eGFR, remains normal, as is liver function, AST and ALT.  Cholesterol levels at goal.  Vitamin D normal.  Continue all current medications and next visit we may reduce Metformin if you A1c remains stable.  Any questions? Keep being amazing!!  Thank you for allowing me to participate in your care.  I appreciate you. Kindest regards, [place]

## 2022-02-06 NOTE — Telephone Encounter (Signed)
CALLED PATIENT NO ANSWER LEFT VOICEMAIL FOR A CALL BACK LETTER SENT 

## 2022-02-08 ENCOUNTER — Encounter: Payer: Medicare PPO | Admitting: Nurse Practitioner

## 2022-02-12 DIAGNOSIS — J3089 Other allergic rhinitis: Secondary | ICD-10-CM | POA: Diagnosis not present

## 2022-02-18 ENCOUNTER — Other Ambulatory Visit: Payer: Self-pay | Admitting: Nurse Practitioner

## 2022-02-18 DIAGNOSIS — Z1231 Encounter for screening mammogram for malignant neoplasm of breast: Secondary | ICD-10-CM

## 2022-02-19 ENCOUNTER — Other Ambulatory Visit: Payer: Self-pay | Admitting: Nurse Practitioner

## 2022-02-19 DIAGNOSIS — J3089 Other allergic rhinitis: Secondary | ICD-10-CM | POA: Diagnosis not present

## 2022-02-19 NOTE — Telephone Encounter (Signed)
Requested Prescriptions  Pending Prescriptions Disp Refills   buPROPion (WELLBUTRIN XL) 300 MG 24 hr tablet [Pharmacy Med Name: BUPROPION XL 300MG  TABLETS] 90 tablet 1    Sig: TAKE 1 TABLET(300 MG) BY MOUTH DAILY     Psychiatry: Antidepressants - bupropion Passed - 02/19/2022 10:03 AM      Passed - Cr in normal range and within 360 days    Creatinine, Ser  Date Value Ref Range Status  02/05/2022 0.80 0.57 - 1.00 mg/dL Final         Passed - AST in normal range and within 360 days    AST  Date Value Ref Range Status  02/05/2022 21 0 - 40 IU/L Final         Passed - ALT in normal range and within 360 days    ALT  Date Value Ref Range Status  02/05/2022 26 0 - 32 IU/L Final         Passed - Completed PHQ-2 or PHQ-9 in the last 360 days      Passed - Last BP in normal range    BP Readings from Last 1 Encounters:  02/05/22 134/78         Passed - Valid encounter within last 6 months    Recent Outpatient Visits          2 weeks ago Type 2 diabetes mellitus with morbid obesity ([place])   [place], Jolene T, NP   3 months ago Type 2 diabetes mellitus with morbid obesity ([place])   [place], Henrine Screws T, NP   5 months ago Flu vaccine need   [employer], [place] T, NP   6 months ago Depression, major, single episode, severe ([place])   [place], Jolene T, NP   1 year ago Depression, major, single episode, severe ([place])   [place], Barbaraann Faster, NP      Future Appointments            In 5 months Cannady, Barbaraann Faster, NP [facility]

## 2022-02-21 DIAGNOSIS — J3089 Other allergic rhinitis: Secondary | ICD-10-CM | POA: Diagnosis not present

## 2022-02-26 DIAGNOSIS — J3089 Other allergic rhinitis: Secondary | ICD-10-CM | POA: Diagnosis not present

## 2022-03-05 DIAGNOSIS — J3089 Other allergic rhinitis: Secondary | ICD-10-CM | POA: Diagnosis not present

## 2022-03-12 DIAGNOSIS — J3089 Other allergic rhinitis: Secondary | ICD-10-CM | POA: Diagnosis not present

## 2022-03-19 DIAGNOSIS — J3089 Other allergic rhinitis: Secondary | ICD-10-CM | POA: Diagnosis not present

## 2022-03-26 ENCOUNTER — Ambulatory Visit
Admission: RE | Admit: 2022-03-26 | Discharge: 2022-03-26 | Disposition: A | Discharge status: Home / Self Care | Payer: Medicare PPO | Source: Ambulatory Visit | Attending: Nurse Practitioner | Admitting: Nurse Practitioner

## 2022-03-26 DIAGNOSIS — Z1231 Encounter for screening mammogram for malignant neoplasm of breast: Secondary | ICD-10-CM | POA: Diagnosis not present

## 2022-03-26 DIAGNOSIS — J3089 Other allergic rhinitis: Secondary | ICD-10-CM | POA: Diagnosis not present

## 2022-03-26 DIAGNOSIS — J301 Allergic rhinitis due to pollen: Secondary | ICD-10-CM | POA: Diagnosis not present

## 2022-03-26 DIAGNOSIS — J3081 Allergic rhinitis due to animal (cat) (dog) hair and dander: Secondary | ICD-10-CM | POA: Diagnosis not present

## 2022-03-27 NOTE — Progress Notes (Signed)
Contacted via MyChart   Normal mammogram, may repeat in one year:)

## 2022-04-02 DIAGNOSIS — J3081 Allergic rhinitis due to animal (cat) (dog) hair and dander: Secondary | ICD-10-CM | POA: Diagnosis not present

## 2022-04-02 DIAGNOSIS — J3089 Other allergic rhinitis: Secondary | ICD-10-CM | POA: Diagnosis not present

## 2022-04-02 DIAGNOSIS — J301 Allergic rhinitis due to pollen: Secondary | ICD-10-CM | POA: Diagnosis not present

## 2022-04-03 ENCOUNTER — Encounter: Payer: Self-pay | Admitting: Nurse Practitioner

## 2022-04-09 DIAGNOSIS — J3089 Other allergic rhinitis: Secondary | ICD-10-CM | POA: Diagnosis not present

## 2022-04-16 DIAGNOSIS — J3089 Other allergic rhinitis: Secondary | ICD-10-CM | POA: Diagnosis not present

## 2022-04-17 ENCOUNTER — Other Ambulatory Visit: Payer: Self-pay | Admitting: Nurse Practitioner

## 2022-04-18 NOTE — Telephone Encounter (Signed)
Requested Prescriptions  ?Pending Prescriptions Disp Refills  ?? atorvastatin (LIPITOR) 80 MG tablet [Pharmacy Med Name: ATORVASTATIN '80MG'$  TABLETS] 90 tablet 0  ?  Sig: TAKE 1 TABLET(80 MG) BY MOUTH DAILY  ?  ? Cardiovascular:  Antilipid - Statins Failed - 04/17/2022 10:49 AM  ?  ?  Failed - Lipid Panel in normal range within the last 12 months  ?  Cholesterol, Total  ?Date Value Ref Range Status  ?02/05/2022 103 100 - 199 mg/dL Final  ? ?Cholesterol [place], [place]  ?Date Value Ref Range Status  ?03/16/2020 172 <200 mg/dL Final  ?  Comment:  ?                          Desirable                <200 ?                        Borderline High      200- 239 ?                        High                     >239 ?  ? ?LDL Chol Calc (NIH)  ?Date Value Ref Range Status  ?02/05/2022 45 0 - 99 mg/dL Final  ? ?HDL  ?Date Value Ref Range Status  ?02/05/2022 35 (L) >39 mg/dL Final  ? ?Triglycerides  ?Date Value Ref Range Status  ?02/05/2022 132 0 - 149 mg/dL Final  ? ?Triglycerides Piccolo,Waived  ?Date Value Ref Range Status  ?03/16/2020 222 (H) <150 mg/dL Final  ?  Comment:  ?                          Normal                   <150 ?                        Borderline High     150 - 199 ?                        High                200 - 499 ?                        Very High                >499 ?  ? ?  ?  ?  Passed - Patient is not pregnant  ?  ?  Passed - Valid encounter within last 12 months  ?  Recent Outpatient Visits   ?      ? 2 months ago Type 2 diabetes mellitus with morbid obesity ([place])  ? [place], [place] T, NP  ? 5 months ago Type 2 diabetes mellitus with morbid obesity ([place])  ? [place], [place] T, NP  ? 7 months ago Flu vaccine need  ? [facility] [place], [place] T, NP  ? 8 months ago Depression, major, single episode, severe ([place])  ? [place], [place] T, NP  ? 1 year ago Depression, major, single episode, severe (  [facility])  ? [facility] [place], Barbaraann Faster, NP  ?  ?  ?Future Appointments   ?        ? In 3 months Cannady, Barbaraann Faster, NP [facility]  ?  ? ?  ?  ?  ? ?

## 2022-04-20 ENCOUNTER — Encounter: Payer: Self-pay | Admitting: Nurse Practitioner

## 2022-04-22 MED ORDER — METFORMIN HCL 500 MG PO TABS: 500.0000 mg | ORAL_TABLET | Freq: Two times a day (BID) | ORAL | 4 refills | Status: DC

## 2022-04-23 ENCOUNTER — Other Ambulatory Visit: Payer: Self-pay | Admitting: Nurse Practitioner

## 2022-04-23 DIAGNOSIS — J3089 Other allergic rhinitis: Secondary | ICD-10-CM | POA: Diagnosis not present

## 2022-04-24 ENCOUNTER — Other Ambulatory Visit: Payer: Self-pay | Admitting: Nurse Practitioner

## 2022-04-24 NOTE — Telephone Encounter (Signed)
Requested Prescriptions  ?Pending Prescriptions Disp Refills  ?? hydrochlorothiazide (HYDRODIURIL) 25 MG tablet [Pharmacy Med Name: HYDROCHLOROTHIAZIDE '25MG'$  TABLETS] 90 tablet 4  ?  Sig: TAKE 1 TABLET(25 MG) BY MOUTH DAILY  ?  ? Cardiovascular: Diuretics - Thiazide Passed - 04/24/2022  3:13 AM  ?  ?  Passed - Cr in normal range and within 180 days  ?  Creatinine, Ser  ?Date Value Ref Range Status  ?02/05/2022 0.80 0.57 - 1.00 mg/dL Final  ?   ?  ?  Passed - K in normal range and within 180 days  ?  Potassium  ?Date Value Ref Range Status  ?02/05/2022 3.8 3.5 - 5.2 mmol/L Final  ?   ?  ?  Passed - Na in normal range and within 180 days  ?  Sodium  ?Date Value Ref Range Status  ?02/05/2022 140 134 - 144 mmol/L Final  ?   ?  ?  Passed - Last BP in normal range  ?  BP Readings from Last 1 Encounters:  ?02/05/22 134/78  ?   ?  ?  Passed - Valid encounter within last 6 months  ?  Recent Outpatient Visits   ?      ? 2 months ago Type 2 diabetes mellitus with morbid obesity ([place])  ? [place], [place] T, NP  ? 5 months ago Type 2 diabetes mellitus with morbid obesity ([place])  ? [place], [place] T, NP  ? 7 months ago Flu vaccine need  ? [facility] [place], [place] T, NP  ? 8 months ago Depression, major, single episode, severe ([place])  ? [place], [place] T, NP  ? 1 year ago Depression, major, single episode, severe ([place])  ? [facility] [place], Henrine Screws T, NP  ?  ?  ?Future Appointments   ?        ? In 3 months Cannady, Barbaraann Faster, NP [facility]  ?  ? ?  ?  ?  ? ?

## 2022-04-24 NOTE — Telephone Encounter (Signed)
Requested Prescriptions  ?Pending Prescriptions Disp Refills  ?? ezetimibe (ZETIA) 10 MG tablet [Pharmacy Med Name: EZETIMIBE '10MG'$  TABLETS] 90 tablet 0  ?  Sig: TAKE 1 TABLET(10 MG) BY MOUTH DAILY  ?  ? Cardiovascular:  Antilipid - Sterol Transport Inhibitors Failed - 04/23/2022  2:09 PM  ?  ?  Failed - Lipid Panel in normal range within the last 12 months  ?  Cholesterol, Total  ?Date Value Ref Range Status  ?02/05/2022 103 100 - 199 mg/dL Final  ? ?Cholesterol [place]colo, [place]  ?Date Value Ref Range Status  ?03/16/2020 172 <200 mg/dL Final  ?  Comment:  ?                          Desirable                <200 ?                        Borderline High      200- 239 ?                        High                     >239 ?  ? ?LDL Chol Calc (NIH)  ?Date Value Ref Range Status  ?02/05/2022 45 0 - 99 mg/dL Final  ? ?HDL  ?Date Value Ref Range Status  ?02/05/2022 35 (L) >39 mg/dL Final  ? ?Triglycerides  ?Date Value Ref Range Status  ?02/05/2022 132 0 - 149 mg/dL Final  ? ?Triglycerides Piccolo,Waived  ?Date Value Ref Range Status  ?03/16/2020 222 (H) <150 mg/dL Final  ?  Comment:  ?                          Normal                   <150 ?                        Borderline High     150 - 199 ?                        High                200 - 499 ?                        Very High                >499 ?  ? ?  ?  ?  Passed - AST in normal range and within 360 days  ?  AST  ?Date Value Ref Range Status  ?02/05/2022 21 0 - 40 IU/L Final  ?   ?  ?  Passed - ALT in normal range and within 360 days  ?  ALT  ?Date Value Ref Range Status  ?02/05/2022 26 0 - 32 IU/L Final  ?   ?  ?  Passed - Patient is not pregnant  ?  ?  Passed - Valid encounter within last 12 months  ?  Recent Outpatient Visits   ?      ? 2 months ago Type 2 diabetes mellitus with morbid obesity ([place])  ? [facility], Henrine Screws T, NP  ? 5 months ago Type 2 diabetes mellitus with morbid obesity ([place])  ? [place], [place] T, NP   ? 7 months ago Flu vaccine need  ? [facility] [place], [place] T, NP  ? 8 months ago Depression, major, single episode, severe ([place])  ? [place], [place] T, NP  ? 1 year ago Depression, major, single episode, severe ([place])  ? [facility] [place], Henrine Screws T, NP  ?  ?  ?Future Appointments   ?        ? In 3 months Cannady, Barbaraann Faster, NP [facility]  ?  ? ?  ?  ?  ? ?

## 2022-04-26 ENCOUNTER — Encounter: Payer: Self-pay | Admitting: Internal Medicine

## 2022-04-30 DIAGNOSIS — J3089 Other allergic rhinitis: Secondary | ICD-10-CM | POA: Diagnosis not present

## 2022-05-07 DIAGNOSIS — J3089 Other allergic rhinitis: Secondary | ICD-10-CM | POA: Diagnosis not present

## 2022-05-09 ENCOUNTER — Encounter: Payer: Self-pay | Admitting: Nurse Practitioner

## 2022-05-09 MED ORDER — QUINAPRIL HCL 20 MG PO TABS: 40.0000 mg | ORAL_TABLET | Freq: Every day | ORAL | 3 refills | Status: DC

## 2022-05-10 MED ORDER — LISINOPRIL 40 MG PO TABS
40.0000 mg | ORAL_TABLET | Freq: Every day | ORAL | 4 refills | Status: DC
Start: 2022-05-10 — End: 2022-08-05

## 2022-05-10 NOTE — Addendum Note (Signed)
Addended by: Marnee Guarneri T on: 05/10/2022 02:52 PM   Modules accepted: Orders

## 2022-05-14 DIAGNOSIS — J3089 Other allergic rhinitis: Secondary | ICD-10-CM | POA: Diagnosis not present

## 2022-05-21 DIAGNOSIS — J3089 Other allergic rhinitis: Secondary | ICD-10-CM | POA: Diagnosis not present

## 2022-05-28 DIAGNOSIS — J3089 Other allergic rhinitis: Secondary | ICD-10-CM | POA: Diagnosis not present

## 2022-06-04 DIAGNOSIS — J3089 Other allergic rhinitis: Secondary | ICD-10-CM | POA: Diagnosis not present

## 2022-06-06 ENCOUNTER — Encounter: Payer: Self-pay | Admitting: Nurse Practitioner

## 2022-06-11 DIAGNOSIS — J3089 Other allergic rhinitis: Secondary | ICD-10-CM | POA: Diagnosis not present

## 2022-06-12 ENCOUNTER — Ambulatory Visit (AMBULATORY_SURGERY_CENTER): Payer: Self-pay | Admitting: *Deleted

## 2022-06-12 VITALS — Ht 63.75 in | Wt 202.0 lb

## 2022-06-12 DIAGNOSIS — Z86010 Personal history of colonic polyps: Secondary | ICD-10-CM

## 2022-06-12 NOTE — Progress Notes (Signed)
No egg or soy allergy known to patient  No issues known to pt with past sedation with any surgeries or procedures Patient denies ever being told they had issues or difficulty with intubation  No FH of Malignant Hyperthermia Pt is not on diet pills Pt is not on  home 02  Pt is not on blood thinners  Pt denies issues with constipation  No A fib or A flutter

## 2022-06-18 DIAGNOSIS — J3081 Allergic rhinitis due to animal (cat) (dog) hair and dander: Secondary | ICD-10-CM | POA: Diagnosis not present

## 2022-06-19 LAB — HM DIABETES EYE EXAM

## 2022-06-27 DIAGNOSIS — J3089 Other allergic rhinitis: Secondary | ICD-10-CM | POA: Diagnosis not present

## 2022-07-02 DIAGNOSIS — J3089 Other allergic rhinitis: Secondary | ICD-10-CM | POA: Diagnosis not present

## 2022-07-04 ENCOUNTER — Encounter: Payer: Self-pay | Admitting: Internal Medicine

## 2022-07-08 ENCOUNTER — Encounter: Payer: Self-pay | Admitting: Internal Medicine

## 2022-07-08 ENCOUNTER — Ambulatory Visit (AMBULATORY_SURGERY_CENTER): Payer: Medicare PPO | Admitting: Internal Medicine

## 2022-07-08 VITALS — BP 122/76 | HR 70 | Resp 13

## 2022-07-08 DIAGNOSIS — G4733 Obstructive sleep apnea (adult) (pediatric): Secondary | ICD-10-CM | POA: Diagnosis not present

## 2022-07-08 DIAGNOSIS — D123 Benign neoplasm of transverse colon: Secondary | ICD-10-CM

## 2022-07-08 DIAGNOSIS — D122 Benign neoplasm of ascending colon: Secondary | ICD-10-CM | POA: Diagnosis not present

## 2022-07-08 DIAGNOSIS — K621 Rectal polyp: Secondary | ICD-10-CM | POA: Diagnosis not present

## 2022-07-08 DIAGNOSIS — D128 Benign neoplasm of rectum: Secondary | ICD-10-CM

## 2022-07-08 DIAGNOSIS — Z86010 Personal history of colonic polyps: Secondary | ICD-10-CM | POA: Diagnosis not present

## 2022-07-08 DIAGNOSIS — I10 Essential (primary) hypertension: Secondary | ICD-10-CM | POA: Diagnosis not present

## 2022-07-08 DIAGNOSIS — Z09 Encounter for follow-up examination after completed treatment for conditions other than malignant neoplasm: Secondary | ICD-10-CM | POA: Diagnosis not present

## 2022-07-08 DIAGNOSIS — E119 Type 2 diabetes mellitus without complications: Secondary | ICD-10-CM | POA: Diagnosis not present

## 2022-07-08 MED ORDER — SODIUM CHLORIDE 0.9 % IV SOLN: 500.0000 mL | INTRAVENOUS | Status: DC

## 2022-07-08 NOTE — Patient Instructions (Addendum)
I found and removed 6 polyps today.  I will let you know pathology results and when to have another routine colonoscopy by mail and/or My Chart.  You also have a condition called diverticulosis - common and not usually a problem. Please read the handout provided.  I appreciate the opportunity to care for you. Gatha Mayer, MD, [facility]  Handout provided on polyps and diverticulosis.   YOU HAD AN ENDOSCOPIC PROCEDURE TODAY AT [facility]:   Refer to the procedure report that was given to you for any specific questions about what was found during the examination.  If the procedure report does not answer your questions, please call your gastroenterologist to clarify.  If you requested that your care partner not be given the details of your procedure findings, then the procedure report has been included in a sealed envelope for you to review at your convenience later.  YOU SHOULD EXPECT: Some feelings of bloating in the abdomen. Passage of more gas than usual.  Walking can help get rid of the air that was put into your GI tract during the procedure and reduce the bloating. If you had a lower endoscopy (such as a colonoscopy or flexible sigmoidoscopy) you may notice spotting of blood in your stool or on the toilet paper. If you underwent a bowel prep for your procedure, you may not have a normal bowel movement for a few days.  Please Note:  You might notice some irritation and congestion in your nose or some drainage.  This is from the oxygen used during your procedure.  There is no need for concern and it should clear up in a day or so.  SYMPTOMS TO REPORT IMMEDIATELY:  Following lower endoscopy (colonoscopy or flexible sigmoidoscopy):  Excessive amounts of blood in the stool  Significant tenderness or worsening of abdominal pains  Swelling of the abdomen that is new, acute  Fever of 100F or higher  For urgent or emergent issues, a gastroenterologist can be reached at any hour  by calling [phone]. Do not use MyChart messaging for urgent concerns.    DIET:  We do recommend a small meal at first, but then you may proceed to your regular diet.  Drink plenty of fluids but you should avoid alcoholic beverages for 24 hours.  ACTIVITY:  You should plan to take it easy for the rest of today and you should NOT DRIVE or use heavy machinery until tomorrow (because of the sedation medicines used during the test).    FOLLOW UP: Our staff will call the number listed on your records the next business day following your procedure.  We will call around 7:15- 8:00 am to check on you and address any questions or concerns that you may have regarding the information given to you following your procedure. If we do not reach you, we will leave a message.  If you develop any symptoms (ie: fever, flu-like symptoms, shortness of breath, cough etc.) before then, please call [phone].  If you test positive for Covid 19 in the 2 weeks post procedure, please call and report this information to [place].    If any biopsies were taken you will be contacted by phone or by letter within the next 1-3 weeks.  Please call us at [phone] if you have not heard about the biopsies in 3 weeks.    SIGNATURES/CONFIDENTIALITY: You and/or your care partner have signed paperwork which will be entered into your electronic medical record.  These signatures  attest to the fact that that the information above on your After Visit Summary has been reviewed and is understood.  Full responsibility of the confidentiality of this discharge information lies with you and/or your care-partner.

## 2022-07-08 NOTE — Progress Notes (Signed)
Pt's states no medical or surgical changes since previsit or office visit. 

## 2022-07-08 NOTE — Progress Notes (Signed)
Report to PACU, RN, vss, BBS= Clear.  

## 2022-07-08 NOTE — Op Note (Signed)
[name] Procedure Date: 07/08/2022 9:06 AM MRN: [id number] Endoscopist: Gatha Mayer , MD Age: 71 Referring MD:  Date of Birth: [birth year] Gender: Female Account #: [id number] Procedure:                Colonoscopy Indications:              Surveillance: Personal history of adenomatous                            polyps on last colonoscopy 5 years ago, Last                            colonoscopy: 2018 Medicines:                Monitored Anesthesia Care Procedure:                Pre-Anesthesia Assessment:                           - Prior to the procedure, a History and Physical                            was performed, and patient medications and                            allergies were reviewed. The patient's tolerance of                            previous anesthesia was also reviewed. The risks                            and benefits of the procedure and the sedation                            options and risks were discussed with the patient.                            All questions were answered, and informed consent                            was obtained. Prior Anticoagulants: The patient has                            taken no previous anticoagulant or antiplatelet                            agents. ASA Grade Assessment: II - A patient with                            mild systemic disease. After reviewing the risks                            and benefits, the patient was deemed in  satisfactory condition to undergo the procedure.                           After obtaining informed consent, the colonoscope                            was passed under direct vision. Throughout the                            procedure, the patient's blood pressure, pulse, and                            oxygen saturations were monitored continuously. The                            Olympus CF-HQ190L ([id number]) Colonoscope was                             introduced through the anus and advanced to the the                            cecum, identified by appendiceal orifice and                            ileocecal valve. The colonoscopy was somewhat                            difficult due to significant looping. Successful                            completion of the procedure was aided by applying                            abdominal pressure. The patient tolerated the                            procedure well. The quality of the bowel                            preparation was good. The ileocecal valve,                            appendiceal orifice, and rectum were photographed.                            The bowel preparation used was Miralax via split                            dose instruction. Scope In: 9:21:24 AM Scope Out: 9:48:39 AM Scope Withdrawal Time: 0 hours 19 minutes 42 seconds  Total Procedure Duration: 0 hours 27 minutes 15 seconds  Findings:                 The perianal and digital rectal examinations were  normal.                           Six sessile polyps were found in the sigmoid colon,                            transverse colon and ascending colon. The polyps                            were 1 to 10 mm in size. These polyps were removed                            with a cold snare. Resection and retrieval were                            complete. Verification of patient identification                            for the specimen was done. Estimated blood loss was                            minimal.                           Multiple diverticula were found in the sigmoid                            colon.                           The exam was otherwise without abnormality on                            direct and retroflexion views. Complications:            No immediate complications. Estimated Blood Loss:     Estimated blood loss was minimal. Impression:                - Six 1 to 10 mm polyps in the sigmoid colon, in                            the transverse colon and in the ascending colon,                            removed with a cold snare. Resected and retrieved.                           - Diverticulosis in the sigmoid colon.                           - The examination was otherwise normal on direct                            and retroflexion views.                           -  Personal history of colonic polyps - since 2004                            (had 10 mm adenoma then, 1 adenoma in 2018). Recommendation:           - Patient has a contact number available for                            emergencies. The signs and symptoms of potential                            delayed complications were discussed with the                            patient. Return to normal activities tomorrow.                            Written discharge instructions were provided to the                            patient.                           - Resume previous diet.                           - Continue present medications.                           - Repeat colonoscopy is recommended for                            surveillance. The colonoscopy date will be                            determined after pathology results from today's                            exam become available for review. Gatha Mayer, MD 07/08/2022 9:58:35 AM This report has been signed electronically.

## 2022-07-08 NOTE — Progress Notes (Signed)
[facility] History and Physical   Primary Care Physician:  Venita Lick, NP   Reason for Procedure:   Hx colon polyps  Plan:    colonoscopy     HPI: Barbara Fitzpatrick is a 71 y.o. female w/ hx colon polyps over the yrs. Last exam 2015 w/ 1 adenoma and 1 hyperplastic polyp - has hx 1 cm adenoma in 2004   Past Medical History:  Diagnosis Date   Allergy    Arthritis    Depression    Diabetes ([place])    GERD (gastroesophageal reflux disease)    Hyperlipidemia    Hypertension    Personal history of colonic adenomas 06/16/2013   4 adenomas (max 1 cm)  04/2003  no polyps 04/2005    Sleep apnea    uses c-pap    Past Surgical History:  Procedure Laterality Date   ABDOMINAL HYSTERECTOMY     COLONOSCOPY     hysterectomy     with bladder tack   POLYPECTOMY     TUBAL LIGATION      Prior to Admission medications   Medication Sig Start Date End Date Taking? Authorizing Provider  aspirin 81 MG tablet Take 81 mg by mouth daily.   Yes [provider]  atorvastatin (LIPITOR) 80 MG tablet TAKE 1 TABLET(80 MG) BY MOUTH DAILY 04/18/22  Yes Cannady, Jolene T, NP  Blood Glucose Monitoring Suppl (ONETOUCH VERIO) w/Device KIT Use to check blood sugar 3 times daily and document for visits.  Bring log to visits. 08/11/21  Yes Cannady, Jolene T, NP  buPROPion (WELLBUTRIN XL) 300 MG 24 hr tablet TAKE 1 TABLET(300 MG) BY MOUTH DAILY 02/19/22  Yes Cannady, Jolene T, NP  cetirizine (ZYRTEC) 10 MG tablet Take 10 mg by mouth daily.   Yes [provider]  Cholecalciferol (VITAMIN D-3 PO) Take 3,000 Units by mouth daily.    Yes [provider]  Coenzyme Q10 (CO Q-10 PO) Take by mouth daily.   Yes [provider]  ezetimibe (ZETIA) 10 MG tablet TAKE 1 TABLET(10 MG) BY MOUTH DAILY 04/24/22  Yes Cannady, Jolene T, NP  glucosamine-chondroitin 500-400 MG tablet Take 1 tablet by mouth 2 (two) times daily.   Yes [provider]  glucose blood test strip  Use to check blood sugar 3 times daily and document for visits.  Bring log to visits. 08/11/21  Yes Cannady, Jolene T, NP  hydrochlorothiazide (HYDRODIURIL) 25 MG tablet TAKE 1 TABLET(25 MG) BY MOUTH DAILY 04/24/22  Yes Cannady, Jolene T, NP  ipratropium (ATROVENT) 0.06 % nasal spray Place into both nostrils. 12/28/21  Yes [provider]  lisinopril (ZESTRIL) 40 MG tablet Take 1 tablet (40 mg total) by mouth daily. 05/10/22  Yes Cannady, Henrine Screws T, NP  Magnesium 250 MG TABS Take by mouth daily.    Yes [provider]  metFORMIN (GLUCOPHAGE) 500 MG tablet Take 1 tablet (500 mg total) by mouth 2 (two) times daily with a meal. 04/22/22  Yes Cannady, Jolene T, NP  Multiple Vitamins-Minerals (MULTIVITAMIN PO) Take by mouth daily.   Yes [provider]  omeprazole (PRILOSEC) 20 MG capsule Take 20 mg by mouth daily.   Yes [provider]  OneTouch Delica Lancets 09O MISC Use to check blood sugar 3 times daily and document for visits.  Bring log to visits. 08/11/21  Yes Cannady, Henrine Screws T, NP  Probiotic Product (PROBIOTIC COLON SUPPORT PO) Take by mouth. Take one pill every other day   Yes  [provider]  vitamin B-12 (CYANOCOBALAMIN) 1000 MCG tablet Take 1,000 mcg by mouth daily.   Yes [provider]  vitamin E 1000 UNIT capsule Take 1,000 Units by mouth daily.   Yes [provider]  acyclovir ointment (ZOVIRAX) 5 % Apply 1 application topically 3 (three) times daily as needed. 01/28/18   Volney American, PA-C  EPIPEN 2-PAK 0.3 MG/0.3ML SOAJ injection as needed.  08/24/13   [provider]  Methylcellulose, Laxative, (FIBER THERAPY PO) Take 1 tablet by mouth daily. Patient not taking: Reported on 06/12/2022    [provider]  nystatin (NYSTATIN) powder Apply topically 3 (three) times daily as needed. 07/27/20   Cannady, Henrine Screws T, NP  nystatin cream (MYCOSTATIN) Apply 1 application topically 2 (two) times daily. 09/14/18   Volney American, PA-C  Olopatadine HCl 0.6 % SOLN U 2 SPRAYS IEN BID 04/20/19   [provider]  valACYclovir (VALTREX) 1000 MG tablet Take one tablet twice daily at the first sign of a flare for about 3-5 days 10/26/21   Marnee Guarneri T, NP    Current Outpatient Medications  Medication Sig Dispense Refill   aspirin 81 MG tablet Take 81 mg by mouth daily.     atorvastatin (LIPITOR) 80 MG tablet TAKE 1 TABLET(80 MG) BY MOUTH DAILY 90 tablet 0   Blood Glucose Monitoring Suppl (ONETOUCH VERIO) w/Device KIT Use to check blood sugar 3 times daily and document for visits.  Bring log to visits. 1 kit 1   buPROPion (WELLBUTRIN XL) 300 MG 24 hr tablet TAKE 1 TABLET(300 MG) BY MOUTH DAILY 90 tablet 1   cetirizine (ZYRTEC) 10 MG tablet Take 10 mg by mouth daily.     Cholecalciferol (VITAMIN D-3 PO) Take 3,000 Units by mouth daily.      Coenzyme Q10 (CO Q-10 PO) Take by mouth daily.     ezetimibe (ZETIA) 10 MG tablet TAKE 1 TABLET(10 MG) BY MOUTH DAILY 90 tablet 0   glucosamine-chondroitin 500-400 MG tablet Take 1 tablet by mouth 2 (two) times daily.     glucose blood test strip Use to check blood sugar 3 times daily and document for visits.  Bring log to visits. 300 each 4   hydrochlorothiazide (HYDRODIURIL) 25 MG tablet TAKE 1 TABLET(25 MG) BY MOUTH DAILY 90 tablet 4   ipratropium (ATROVENT) 0.06 % nasal spray Place into both nostrils.     lisinopril (ZESTRIL) 40 MG tablet Take 1 tablet (40 mg total) by mouth daily. 90 tablet 4   Magnesium 250 MG TABS Take by mouth daily.      metFORMIN (GLUCOPHAGE) 500 MG tablet Take 1 tablet (500 mg total) by mouth 2 (two) times daily with a meal. 180 tablet 4   Multiple Vitamins-Minerals (MULTIVITAMIN PO) Take by mouth daily.     omeprazole (PRILOSEC) 20 MG capsule Take 20 mg by mouth daily.     OneTouch Delica Lancets 08M MISC Use to check blood sugar 3 times daily and document for visits.  Bring log to visits. 300 each 4   Probiotic Product (PROBIOTIC COLON  SUPPORT PO) Take by mouth. Take one pill every other day     vitamin B-12 (CYANOCOBALAMIN) 1000 MCG tablet Take 1,000 mcg by mouth daily.     vitamin E 1000 UNIT capsule Take 1,000 Units by mouth daily.     acyclovir ointment (ZOVIRAX) 5 % Apply 1 application topically 3 (three) times daily as needed. 15 g 1   EPIPEN 2-PAK  0.3 MG/0.3ML SOAJ injection as needed.      Methylcellulose, Laxative, (FIBER THERAPY PO) Take 1 tablet by mouth daily. (Patient not taking: Reported on 06/12/2022)     nystatin (NYSTATIN) powder Apply topically 3 (three) times daily as needed. 60 g 1   nystatin cream (MYCOSTATIN) Apply 1 application topically 2 (two) times daily. 60 g 1   Olopatadine HCl 0.6 % SOLN U 2 SPRAYS IEN BID     valACYclovir (VALTREX) 1000 MG tablet Take one tablet twice daily at the first sign of a flare for about 3-5 days 20 tablet 6   Current Facility-Administered Medications  Medication Dose Route Frequency Provider Last Rate Last Admin   0.9 %  sodium chloride infusion  500 mL Intravenous Continuous Gatha Mayer, MD        Allergies as of 07/08/2022   (No Known Allergies)    Family History  Problem Relation Age of Onset   Hypertension Mother    Hyperlipidemia Mother    Diabetes Mother    Cancer Mother        uterine or ovarian cancer   Glaucoma Mother    Heart disease Father    Hypertension Father    Hyperlipidemia Father    Diabetes Father    Diabetes Sister    Lung disease Sister        polyps   Cancer Brother        kidney- on dialysis   Glaucoma Brother    Hyperlipidemia Brother    Hypertension Brother    Cancer Maternal Aunt 48       ovarian   Stroke Maternal Grandfather    Diabetes Paternal Grandmother    Heart disease Paternal Grandfather    Esophageal cancer Neg Hx    Rectal cancer Neg Hx    Stomach cancer Neg Hx    Breast cancer Neg Hx    Colon polyps Neg Hx     Social History   Socioeconomic History   Marital status: Single    Spouse name: Not on  file   Number of children: Not on file   Years of education: Not on file   Highest education level: 12th grade  Occupational History   Occupation: retired  Tobacco Use   Smoking status: Never   Smokeless tobacco: Never  Vaping Use   Vaping Use: Never used  Substance and Sexual Activity   Alcohol use: No   Drug use: No   Sexual activity: Not Currently  Other Topics Concern   Not on file  Social History Narrative   Not on file   Social Determinants of Health   Financial Resource Strain: Low Risk  (09/07/2021)   Overall Financial Resource Strain (CARDIA)    Difficulty of Paying Living Expenses: Not very hard  Food Insecurity: Food Insecurity Present (09/07/2021)   Hunger Vital Sign    Worried About Running Out of Food in the Last Year: Never true    Ran Out of Food in the Last Year: Sometimes true  Transportation Needs: No Transportation Needs (09/07/2021)   PRAPARE - Hydrologist (Medical): No    Lack of Transportation (Non-Medical): No  Physical Activity: Insufficiently Active (09/07/2021)   Exercise Vital Sign    Days of Exercise per Week: 2 days    Minutes of Exercise per Session: 30 min  Stress: No Stress Concern Present (09/07/2021)   [place]    Feeling of  Stress : Only a little  Social Connections: Moderately Isolated (09/07/2021)   Social Connection and Isolation Panel [NHANES]    Frequency of Communication with Friends and Family: More than three times a week    Frequency of Social Gatherings with Friends and Family: More than three times a week    Attends Religious Services: More than 4 times per year    Active Member of [employer] or Organizations: No    Attends Archivist Meetings: Never    Marital Status: Widowed  Intimate Partner Violence: Not At Risk (09/07/2021)   Humiliation, Afraid, Rape, and Kick questionnaire    Fear of Current or Ex-Partner: No     Emotionally Abused: No    Physically Abused: No    Sexually Abused: No    Review of Systems:  All other review of systems negative except as mentioned in the HPI.  Physical Exam: Vital signs LMP  (LMP Unknown)   General:   Alert,  Well-developed, well-nourished, pleasant and cooperative in NAD Lungs:  Clear throughout to auscultation.   Heart:  Regular rate and rhythm; no murmurs, clicks, rubs,  or gallops. Abdomen:  Soft, nontender and nondistended. Normal bowel sounds.   Neuro/Psych:  Alert and cooperative. Normal mood and affect. A and O x 3   '@Kaylie Ritter'  Simonne Maffucci, MD, [facility] [phone] (pager) 07/08/2022 9:11 AM@

## 2022-07-08 NOTE — Progress Notes (Signed)
Called to room to assist during endoscopic procedure.  Patient ID and intended procedure confirmed with present staff. Received instructions for my participation in the procedure from the performing physician.  

## 2022-07-09 ENCOUNTER — Telehealth: Payer: Self-pay

## 2022-07-09 DIAGNOSIS — J3089 Other allergic rhinitis: Secondary | ICD-10-CM | POA: Diagnosis not present

## 2022-07-09 NOTE — Telephone Encounter (Signed)
  Follow up Call-     07/08/2022    9:01 AM  Call back number  Post procedure Call Back phone  # [phone]  Permission to leave phone message Yes     Patient questions:  Do you have a fever, pain , or abdominal swelling? No. Pain Score  0 *  Have you tolerated food without any problems? Yes.    Have you been able to return to your normal activities? Yes.    Do you have any questions about your discharge instructions: Diet   No. Medications  No. Follow up visit  No.  Do you have questions or concerns about your Care? No.  Actions: * If pain score is 4 or above: No action needed, pain <4.

## 2022-07-12 ENCOUNTER — Encounter: Payer: Self-pay | Admitting: Internal Medicine

## 2022-07-18 DIAGNOSIS — J3089 Other allergic rhinitis: Secondary | ICD-10-CM | POA: Diagnosis not present

## 2022-07-19 ENCOUNTER — Other Ambulatory Visit: Payer: Self-pay | Admitting: Nurse Practitioner

## 2022-07-19 NOTE — Telephone Encounter (Signed)
Requested Prescriptions  Pending Prescriptions Disp Refills  . ezetimibe (ZETIA) 10 MG tablet [Pharmacy Med Name: EZETIMIBE '10MG'$  TABLETS] 90 tablet 0    Sig: TAKE 1 TABLET(10 MG) BY MOUTH DAILY     Cardiovascular:  Antilipid - Sterol Transport Inhibitors Failed - 07/19/2022 11:12 AM      Failed - Lipid Panel in normal range within the last 12 months    Cholesterol, Total  Date Value Ref Range Status  02/05/2022 103 100 - 199 mg/dL Final   Cholesterol Piccolo, Waived  Date Value Ref Range Status  03/16/2020 172 <200 mg/dL Final    Comment:                            Desirable                <200                         Borderline High      200- 239                         High                     >239    LDL Chol Calc (NIH)  Date Value Ref Range Status  02/05/2022 45 0 - 99 mg/dL Final   HDL  Date Value Ref Range Status  02/05/2022 35 (L) >39 mg/dL Final   Triglycerides  Date Value Ref Range Status  02/05/2022 132 0 - 149 mg/dL Final   Triglycerides Piccolo,Waived  Date Value Ref Range Status  03/16/2020 222 (H) <150 mg/dL Final    Comment:                            Normal                   <150                         Borderline High     150 - 199                         High                200 - 499                         Very High                >499          Passed - AST in normal range and within 360 days    AST  Date Value Ref Range Status  02/05/2022 21 0 - 40 IU/L Final         Passed - ALT in normal range and within 360 days    ALT  Date Value Ref Range Status  02/05/2022 26 0 - 32 IU/L Final         Passed - Patient is not pregnant      Passed - Valid encounter within last 12 months    Recent Outpatient Visits          5 months ago 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, Barbaraann Faster, NP      Future Appointments            In 2 weeks Cannady, Barbaraann Faster, NP [facility]           . atorvastatin (LIPITOR) 80 MG tablet [Pharmacy Med Name: ATORVASTATIN '80MG'$  TABLETS] 90 tablet 0    Sig: TAKE 1 TABLET(80 MG) BY MOUTH DAILY     Cardiovascular:  Antilipid - Statins Failed - 07/19/2022 11:12 AM      Failed - Lipid Panel in normal range within the last 12 months    Cholesterol, Total  Date Value Ref Range Status  02/05/2022 103 100 - 199 mg/dL Final   Cholesterol Piccolo, Waived  Date Value Ref Range Status  03/16/2020 172 <200 mg/dL Final    Comment:                            Desirable                <200                         Borderline High      200- 239                         High                     >239    LDL Chol Calc (NIH)  Date Value Ref Range Status  02/05/2022 45 0 - 99 mg/dL Final   HDL  Date Value Ref Range Status  02/05/2022 35 (L) >39 mg/dL Final   Triglycerides  Date Value Ref Range Status  02/05/2022 132 0 - 149 mg/dL Final   Triglycerides Piccolo,Waived  Date Value Ref Range Status  03/16/2020 222 (H) <150 mg/dL Final    Comment:                            Normal                   <150                         Borderline High     150 - 199                         High                200 - 499                         Very High                >499          Passed - Patient is not pregnant  Passed - Valid encounter within last 12 months    Recent Outpatient Visits          5 months ago 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

## 2022-07-30 DIAGNOSIS — J3089 Other allergic rhinitis: Secondary | ICD-10-CM | POA: Diagnosis not present

## 2022-08-03 NOTE — Patient Instructions (Incomplete)
Stop Lisinopril 40 MG and Hydrochlorothiazide 25 MG (blood pressure medications) = start combo pill Benazepril-Hydrochlorothiazide 20-12.5 MG dosing.  Depression and Anxiety You will learn about mood disorders, how these conditions can occur in people with heart disease, and why it is important to treat these conditions. To view the content, go to this web address: [email or web address].elsevier.[email or web address]  This video will expire on: 06/14/2024. If you need access to this video following this date, please reach out to the healthcare provider who assigned it to you. This information is not intended to replace advice given to you by your health care provider. Make sure you discuss any questions you have with your health care provider. Elsevier Patient Education  [place].

## 2022-08-05 ENCOUNTER — Encounter: Payer: Self-pay | Admitting: Nurse Practitioner

## 2022-08-05 ENCOUNTER — Ambulatory Visit: Payer: Medicare PPO | Admitting: Nurse Practitioner

## 2022-08-05 DIAGNOSIS — Z9989 Dependence on other enabling machines and devices: Secondary | ICD-10-CM

## 2022-08-05 DIAGNOSIS — I152 Hypertension secondary to endocrine disorders: Secondary | ICD-10-CM

## 2022-08-05 DIAGNOSIS — G4733 Obstructive sleep apnea (adult) (pediatric): Secondary | ICD-10-CM | POA: Diagnosis not present

## 2022-08-05 DIAGNOSIS — E1169 Type 2 diabetes mellitus with other specified complication: Secondary | ICD-10-CM

## 2022-08-05 DIAGNOSIS — F5101 Primary insomnia: Secondary | ICD-10-CM

## 2022-08-05 DIAGNOSIS — F322 Major depressive disorder, single episode, severe without psychotic features: Secondary | ICD-10-CM | POA: Diagnosis not present

## 2022-08-05 DIAGNOSIS — E1159 Type 2 diabetes mellitus with other circulatory complications: Secondary | ICD-10-CM | POA: Diagnosis not present

## 2022-08-05 DIAGNOSIS — Z23 Encounter for immunization: Secondary | ICD-10-CM

## 2022-08-05 DIAGNOSIS — E785 Hyperlipidemia, unspecified: Secondary | ICD-10-CM | POA: Diagnosis not present

## 2022-08-05 LAB — BAYER DCA HB A1C WAIVED: HB A1C (BAYER DCA - WAIVED): 5.6 % (ref 4.8–5.6)

## 2022-08-05 MED ORDER — ATORVASTATIN CALCIUM 80 MG PO TABS: ORAL_TABLET | ORAL | 4 refills | Status: DC

## 2022-08-05 MED ORDER — BUPROPION HCL ER (XL) 300 MG PO TB24: ORAL_TABLET | ORAL | 4 refills | Status: DC

## 2022-08-05 MED ORDER — BENAZEPRIL-HYDROCHLOROTHIAZIDE 20-12.5 MG PO TABS: 1.0000 | ORAL_TABLET | Freq: Every day | ORAL | 2 refills | Status: DC

## 2022-08-05 MED ORDER — EZETIMIBE 10 MG PO TABS: ORAL_TABLET | ORAL | 4 refills | Status: DC

## 2022-08-05 NOTE — Assessment & Plan Note (Signed)
Chronic, stable.  Continue current medication regimen and adjust as needed.  Lipid panel today. 

## 2022-08-05 NOTE — Assessment & Plan Note (Addendum)
Chronic, ongoing with A1c remaining stable at 5.6% today and 47 pounds weight loss. Recommend continued heavy focus on diet and exercise regimen.  Continue Metformin 500 MG BID.  Check BS three mornings a week and document.

## 2022-08-05 NOTE — Assessment & Plan Note (Signed)
Chronic, ongoing.  Continue current medication regimen, Wellbutrin 300 MG daily.  Denies SI/HI -- is aware to notify provider immediately if SI presents or plan.  No medication adjustment at this time.   

## 2022-08-05 NOTE — Progress Notes (Signed)
BP 117/79   Pulse 69   Temp 97.6 F (36.4 C) (Oral)   Wt 196 lb 12.8 oz (89.3 kg)   LMP  (LMP Unknown)   SpO2 99%   BMI 34.05 kg/m    Subjective:    Patient ID: Barbara Fitzpatrick, female    DOB: 02/20/1951, 71 y.o.   MRN: [id number]  HPI: Barbara Fitzpatrick is a 71 y.o. female  Chief Complaint  Patient presents with   Hypertension   Hyperlipidemia   Diabetes    6 month follow up    DIABETES Diagnosed in August 2022.  Last A1c 5.5% in February.  Taking Metformin 500 MG BID. Has lost 47 pounds total since August 2022. Has been working on diet changes. Is taking daily B12.   Hypoglycemic episodes:no Polydipsia/polyuria: no Visual disturbance: no Chest pain: no Paresthesias: no Glucose Monitoring: yes  Accucheck frequency: TID 87 to 138  Fasting glucose:   Post prandial:  Evening:  Before meals: Taking Insulin?: no  Long acting insulin:  Short acting insulin: Blood Pressure Monitoring: daily Retinal Examination: Up to Date Foot Exam: Up to Date Pneumovax: Up to Date Influenza: Up to Date Aspirin: yes   HYPERTENSION / HYPERLIPIDEMIA Continues on Lisinopril 40 MG (causing dry mouth) + HCTZ 25 MG & Lipitor 80 MG + Zetia. Has CPAP and uses 100% of the time. Satisfied with current treatment? yes Duration of hypertension: chronic BP monitoring frequency: daily BP range: 89/67 to 121/81 BP medication side effects: no Duration of hyperlipidemia: chronic Cholesterol medication side effects: no Cholesterol supplements: none Medication compliance: good compliance Aspirin: yes Recent stressors: no Recurrent headaches: no Visual changes: no Palpitations: no Dyspnea: no Chest pain: no Lower extremity edema: no Dizzy/lightheaded: no   DEPRESSION Continues to have good control on Wellbutrin. Mood status: stable Satisfied with current treatment?: yes Symptom severity: mild  Duration of current treatment : chronic Side effects: no Medication compliance: good  compliance Psychotherapy/counseling: none Depressed mood: no Anxious mood: no Anhedonia: no Significant weight loss or gain: no Insomnia: occasional Fatigue: no Feelings of worthlessness or guilt: no Impaired concentration/indecisiveness: no Suicidal ideations: no Hopelessness: no Crying spells: no    08/05/2022   10:52 AM 02/05/2022   10:43 AM 11/08/2021   10:22 AM 09/07/2021    5:17 PM 08/07/2021   10:33 AM  Depression screen PHQ 2/9  Decreased Interest 1 0 0 0 0  Down, Depressed, Hopeless '1 1 1 1 1  '$ PHQ - 2 Score '2 1 1 1 1  '$ Altered sleeping 1 1 0  0  Tired, decreased energy 0 '1 1  3  '$ Change in appetite 0 1 0  3  Feeling bad or failure about yourself  0 0 0  1  Trouble concentrating 1 0 1  0  Moving slowly or fidgety/restless 0 0 0  0  Suicidal thoughts 0 0 0  1  PHQ-9 Score '4 4 3  9  '$ Difficult doing work/chores Not difficult at all Not difficult at all   Not difficult at all       08/05/2022   10:53 AM 02/05/2022   10:44 AM 09/14/2020   10:57 AM 01/11/2020   10:29 AM  GAD 7 : Generalized Anxiety Score  Nervous, Anxious, on Edge '1 1 1 '$ 0  Control/stop worrying 1 0 1 0  Worry too much - different things '1 1 1 '$ 0  Trouble relaxing 1 0 1 0  Restless 0 0 2 0  Easily annoyed  or irritable 0 0 1 0  Afraid - awful might happen 1 0 1 0  Total GAD 7 Score '5 2 8 '$ 0  Anxiety Difficulty Not difficult at all  Not difficult at all Not difficult at all     Relevant past medical, surgical, family and social history reviewed and updated as indicated. Interim medical history since our last visit reviewed. Allergies and medications reviewed and updated.  Review of Systems  Constitutional:  Negative for activity change, appetite change, diaphoresis, fatigue and fever.  Respiratory:  Negative for cough, chest tightness and shortness of breath.   Cardiovascular:  Negative for chest pain, palpitations and leg swelling.  Gastrointestinal: Negative.   Endocrine: Negative for polydipsia,  polyphagia and polyuria.  Neurological: Negative.   Psychiatric/Behavioral: Negative.      Per HPI unless specifically indicated above     Objective:    BP 117/79   Pulse 69   Temp 97.6 F (36.4 C) (Oral)   Wt 196 lb 12.8 oz (89.3 kg)   LMP  (LMP Unknown)   SpO2 99%   BMI 34.05 kg/m   Wt Readings from Last 3 Encounters:  08/05/22 196 lb 12.8 oz (89.3 kg)  06/12/22 202 lb (91.6 kg)  02/05/22 212 lb 3.2 oz (96.3 kg)    Physical Exam Vitals and nursing note reviewed.  Constitutional:      General: She is awake. She is not in acute distress.    Appearance: She is well-developed and well-groomed. She is obese. She is not ill-appearing or toxic-appearing.  HENT:     Head: Normocephalic.     Right Ear: Hearing, tympanic membrane, ear canal and external ear normal.     Left Ear: Hearing, tympanic membrane, ear canal and external ear normal.  Eyes:     General: Lids are normal.        Right eye: No discharge.        Left eye: No discharge.     Conjunctiva/sclera: Conjunctivae normal.     Pupils: Pupils are equal, round, and reactive to light.  Neck:     Thyroid: No thyromegaly.     Vascular: No carotid bruit.  Cardiovascular:     Rate and Rhythm: Normal rate and regular rhythm.     Heart sounds: Normal heart sounds. No murmur heard.    No gallop.  Pulmonary:     Effort: Pulmonary effort is normal. No accessory muscle usage or respiratory distress.     Breath sounds: Normal breath sounds.  Abdominal:     General: Bowel sounds are normal.     Palpations: Abdomen is soft. There is no hepatomegaly or splenomegaly.  Musculoskeletal:     Cervical back: Normal range of motion and neck supple.     Right lower leg: No edema.     Left lower leg: No edema.  Lymphadenopathy:     Cervical: No cervical adenopathy.  Skin:    General: Skin is warm and dry.  Neurological:     Mental Status: She is alert and oriented to person, place, and time.  Psychiatric:        Attention and  Perception: Attention normal.        Mood and Affect: Mood normal.        Speech: Speech normal.        Behavior: Behavior normal. Behavior is cooperative.        Thought Content: Thought content normal.     Results for orders placed or performed in  visit on 08/05/22  Bayer DCA Hb A1c Waived  Result Value Ref Range   HB A1C (BAYER DCA - WAIVED) 5.6 4.8 - 5.6 %      Assessment & Plan:   Problem List Items Addressed This Visit       Cardiovascular and Mediastinum   Hypertension associated with type 2 diabetes mellitus ([place])    Chronic, ongoing with BP well below goal on home checks and at goal in office.  Recommend she monitor BP at least a few mornings a week at home and document.  DASH diet at home.  Will work on medication reduction, change to Benazepril-HCTZ 20-12.5 MG dosing, discussed with patient.  Labs today: BMP (urine ALB 01 January 2022).  Return in 4 weeks.       Relevant Medications   benazepril-hydrochlorthiazide (LOTENSIN HCT) 20-12.5 MG tablet   ezetimibe (ZETIA) 10 MG tablet   atorvastatin (LIPITOR) 80 MG tablet   Other Relevant Orders   Bayer DCA Hb A1c Waived (Completed)   Basic metabolic panel     Respiratory   OSA on CPAP    Chronic, continue to use CPAP 100% of the time.        Endocrine   Hyperlipidemia associated with type 2 diabetes mellitus (HCC)    Chronic, stable.  Continue current medication regimen and adjust as needed.  Lipid panel today.      Relevant Medications   benazepril-hydrochlorthiazide (LOTENSIN HCT) 20-12.5 MG tablet   ezetimibe (ZETIA) 10 MG tablet   atorvastatin (LIPITOR) 80 MG tablet   Other Relevant Orders   Bayer DCA Hb A1c Waived (Completed)   Lipid Panel w/o Chol/HDL Ratio   Type 2 diabetes mellitus with morbid obesity (HCC) - Primary    Chronic, ongoing with A1c remaining stable at 5.6% today and 47 pounds weight loss. Recommend continued heavy focus on diet and exercise regimen.  Continue Metformin 500 MG BID.  Check  BS three mornings a week and document.        Relevant Medications   benazepril-hydrochlorthiazide (LOTENSIN HCT) 20-12.5 MG tablet   atorvastatin (LIPITOR) 80 MG tablet   Other Relevant Orders   Bayer DCA Hb A1c Waived (Completed)   Basic metabolic panel     Other   Depression, major, single episode, severe (HCC)    Chronic, ongoing.  Continue current medication regimen, Wellbutrin 300 MG daily.  Denies SI/HI -- is aware to notify provider immediately if SI presents or plan.  No medication adjustment at this time.        Relevant Medications   buPROPion (WELLBUTRIN XL) 300 MG 24 hr tablet   Morbid obesity (HCC)    BMI 34.05, praised for 47 pounds loss.  Recommended eating smaller high protein, low fat meals more frequently and exercising 30 mins a day 5 times a week with a goal of 10-15lb weight loss in the next 3 months. Patient voiced their understanding and motivation to adhere to these recommendations.       Other Visit Diagnoses     Need for Td vaccine       Td vaccine today.   Relevant Orders   Td vaccine greater than or equal to 7yo preservative free IM        Follow up plan: Return in about 4 weeks (around 09/02/2022) for HTN -- changed medications and lowered.

## 2022-08-05 NOTE — Assessment & Plan Note (Signed)
Chronic, continue to use CPAP 100% of the time. 

## 2022-08-05 NOTE — Assessment & Plan Note (Signed)
BMI 34.05, praised for 47 pounds loss.  Recommended eating smaller high protein, low fat meals more frequently and exercising 30 mins a day 5 times a week with a goal of 10-15lb weight loss in the next 3 months. Patient voiced their understanding and motivation to adhere to these recommendations.

## 2022-08-05 NOTE — Assessment & Plan Note (Signed)
Chronic, ongoing with BP well below goal on home checks and at goal in office.  Recommend she monitor BP at least a few mornings a week at home and document.  DASH diet at home.  Will work on medication reduction, change to Benazepril-HCTZ 20-12.5 MG dosing, discussed with patient.  Labs today: BMP (urine ALB 01 January 2022).  Return in 4 weeks.

## 2022-08-06 DIAGNOSIS — J3089 Other allergic rhinitis: Secondary | ICD-10-CM | POA: Diagnosis not present

## 2022-08-06 LAB — BASIC METABOLIC PANEL
BUN/Creatinine Ratio: 16 (ref 12–28)
BUN: 13 mg/dL (ref 8–27)
CO2: 26 mmol/L (ref 20–29)
Calcium: 10.1 mg/dL (ref 8.7–10.3)
Chloride: 102 mmol/L (ref 96–106)
Creatinine, Ser: 0.8 mg/dL (ref 0.57–1.00)
Glucose: 104 mg/dL — ABNORMAL HIGH (ref 70–99)
Potassium: 4 mmol/L (ref 3.5–5.2)
Sodium: 142 mmol/L (ref 134–144)
eGFR: 79 mL/min/{1.73_m2} (ref >59)

## 2022-08-06 LAB — LIPID PANEL W/O CHOL/HDL RATIO
Cholesterol, Total: 104 mg/dL (ref 100–199)
HDL: 38 mg/dL — ABNORMAL LOW (ref >39)
LDL Chol Calc (NIH): 44 mg/dL (ref 0–99)
Triglycerides: 125 mg/dL (ref 0–149)
VLDL Cholesterol Cal: 22 mg/dL (ref 5–40)

## 2022-08-06 NOTE — Progress Notes (Signed)
Contacted via [facility], your labs have returned and overall remain stable.  - Kidney function, creatinine and eGFR, remains normal.  Cholesterol levels at goal.  Amazing work!!  Keep it up!!  I am so proud.  Patients like you bring a smile to me, as I see how far you have come. Keep being inspiring!!  Thank you for allowing me to participate in your care.  I appreciate you. Kindest regards, Tyrina Hines

## 2022-08-08 NOTE — Progress Notes (Signed)
[facility] [place], [place] [address]  Pulmonary Sleep Medicine   Office Visit Note  Patient Name: Barbara Fitzpatrick DOB: [birth year] MRN [id number]    Chief Complaint: Obstructive Sleep Apnea visit  Brief History:  Barbara Fitzpatrick is seen today for an annual follow up on CPAP at 7 cmh20. The patient has a 6 year history of sleep apnea. Patient is using PAP nightly.  The patient feels rested after sleeping with PAP.  The patient reports benefiting from PAP use. Reported sleepiness is  improved and the Epworth Sleepiness Score is 1 out of 24. The patient does not take naps. The patient complains of the following: Some issues with insomnia, she has difficulty falling asleep and staying asleep.  The compliance download shows 93% compliance with an average use time of 8 hours 21 minutes. The AHI is 0.7  The patient does not complain of limb movements disrupting sleep.  ROS  General: (-) fever, (-) chills, (-) night sweat Nose and Sinuses: (-) nasal stuffiness or itchiness, (-) postnasal drip, (-) nosebleeds, (-) sinus trouble. Mouth and Throat: (-) sore throat, (-) hoarseness. Neck: (-) swollen glands, (-) enlarged thyroid, (-) neck pain. Respiratory: - cough, - shortness of breath, - wheezing. Neurologic: - numbness, - tingling. Psychiatric: + anxiety, - depression   Current Medication: Outpatient Encounter Medications as of 08/12/2022  Medication Sig   acyclovir ointment (ZOVIRAX) 5 % Apply 1 application topically 3 (three) times daily as needed.   aspirin 81 MG tablet Take 81 mg by mouth daily.   atorvastatin (LIPITOR) 80 MG tablet TAKE 1 TABLET(80 MG) BY MOUTH DAILY   benazepril-hydrochlorthiazide (LOTENSIN HCT) 20-12.5 MG tablet Take 1 tablet by mouth daily.   Blood Glucose Monitoring Suppl (ONETOUCH VERIO) w/Device KIT Use to check blood sugar 3 times daily and document for visits.  Bring log to visits.   buPROPion (WELLBUTRIN XL) 300 MG 24 hr tablet TAKE 1  TABLET(300 MG) BY MOUTH DAILY   cetirizine (ZYRTEC) 10 MG tablet Take 10 mg by mouth daily.   Cholecalciferol (VITAMIN D-3 PO) Take 3,000 Units by mouth daily.    Coenzyme Q10 (CO Q-10 PO) Take by mouth daily.   EPIPEN 2-PAK 0.3 MG/0.3ML SOAJ injection as needed.    ezetimibe (ZETIA) 10 MG tablet TAKE 1 TABLET(10 MG) BY MOUTH DAILY   glucosamine-chondroitin 500-400 MG tablet Take 1 tablet by mouth 2 (two) times daily.   glucose blood test strip Use to check blood sugar 3 times daily and document for visits.  Bring log to visits.   ipratropium (ATROVENT) 0.06 % nasal spray Place into both nostrils.   Magnesium 250 MG TABS Take by mouth daily.    metFORMIN (GLUCOPHAGE) 500 MG tablet Take 1 tablet (500 mg total) by mouth 2 (two) times daily with a meal.   Multiple Vitamins-Minerals (MULTIVITAMIN PO) Take by mouth daily.   nystatin (NYSTATIN) powder Apply topically 3 (three) times daily as needed.   nystatin cream (MYCOSTATIN) Apply 1 application topically 2 (two) times daily.   omeprazole (PRILOSEC) 20 MG capsule Take 20 mg by mouth daily.   OneTouch Delica Lancets 40G MISC Use to check blood sugar 3 times daily and document for visits.  Bring log to visits.   Probiotic Product (PROBIOTIC COLON SUPPORT PO) Take by mouth. Take one pill every other day   valACYclovir (VALTREX) 1000 MG tablet Take one tablet twice daily at the first sign of a flare for about 3-5 days   vitamin B-12 (  CYANOCOBALAMIN) 1000 MCG tablet Take 1,000 mcg by mouth daily.   vitamin E 1000 UNIT capsule Take 1,000 Units by mouth daily.   No facility-administered encounter medications on file as of 08/12/2022.    Surgical History: Past Surgical History:  Procedure Laterality Date   ABDOMINAL HYSTERECTOMY     COLONOSCOPY     hysterectomy     with bladder tack   POLYPECTOMY     TUBAL LIGATION      Medical History: Past Medical History:  Diagnosis Date   Allergy    Arthritis    Depression    Diabetes (HCC)    GERD  (gastroesophageal reflux disease)    Hyperlipidemia    Hypertension    Personal history of colonic adenomas 06/16/2013   4 adenomas (max 1 cm)  04/2003  no polyps 04/2005    Sleep apnea    uses c-pap    Family History: Non contributory to the present illness  Social History: Social History   Socioeconomic History   Marital status: Single    Spouse name: Not on file   Number of children: Not on file   Years of education: Not on file   Highest education level: 12th grade  Occupational History   Occupation: retired  Tobacco Use   Smoking status: Never   Smokeless tobacco: Never  Vaping Use   Vaping Use: Never used  Substance and Sexual Activity   Alcohol use: No   Drug use: No   Sexual activity: Not Currently  Other Topics Concern   Not on file  Social History Narrative   Not on file   Social Determinants of Health   Financial Resource Strain: Low Risk  (09/07/2021)   Overall Financial Resource Strain (CARDIA)    Difficulty of Paying Living Expenses: Not very hard  Food Insecurity: Food Insecurity Present (09/07/2021)   Hunger Vital Sign    Worried About Running Out of Food in the Last Year: Never true    Ran Out of Food in the Last Year: Sometimes true  Transportation Needs: No Transportation Needs (09/07/2021)   PRAPARE - Hydrologist (Medical): No    Lack of Transportation (Non-Medical): No  Physical Activity: Insufficiently Active (09/07/2021)   Exercise Vital Sign    Days of Exercise per Week: 2 days    Minutes of Exercise per Session: 30 min  Stress: No Stress Concern Present (09/07/2021)   [place]    Feeling of Stress : Only a little  Social Connections: Moderately Isolated (09/07/2021)   Social Connection and Isolation Panel [NHANES]    Frequency of Communication with Friends and Family: More than three times a week    Frequency of Social Gatherings with Friends  and Family: More than three times a week    Attends Religious Services: More than 4 times per year    Active Member of [employer] or Organizations: No    Attends Archivist Meetings: Never    Marital Status: Widowed  Intimate Partner Violence: Not At Risk (09/07/2021)   Humiliation, Afraid, Rape, and Kick questionnaire    Fear of Current or Ex-Partner: No    Emotionally Abused: No    Physically Abused: No    Sexually Abused: No    Vital Signs: There were no vitals taken for this visit. There is no height or weight on file to calculate BMI.    Examination: General Appearance: The patient is well-developed, well-nourished,  and in no distress. Neck Circumference: 39 cm Skin: Gross inspection of skin unremarkable. Head: normocephalic, no gross deformities. Eyes: no gross deformities noted. ENT: ears appear grossly normal Neurologic: Alert and oriented. No involuntary movements.  STOP BANG RISK ASSESSMENT S (snore) Have you been told that you snore?     NO   T (tired) Are you often tired, fatigued, or sleepy during the day?   NO  O (obstruction) Do you stop breathing, choke, or gasp during sleep? NO   P (pressure) Do you have or are you being treated for high blood pressure? YES   B (BMI) Is your body index greater than 35 kg/m? NO   A (age) Are you 2 years old or older? YES   N (neck) Do you have a neck circumference greater than 16 inches?   NO   G (gender) Are you a female? NO   TOTAL STOP/BANG "YES" ANSWERS 2       A STOP-Bang score of 2 or less is considered low risk, and a score of 5 or more is high risk for having either moderate or severe OSA. For people who score 3 or 4, doctors may need to perform further assessment to determine how likely they are to have OSA.         EPWORTH SLEEPINESS SCALE:  Scale:  (0)= no chance of dozing; (1)= slight chance of dozing; (2)= moderate chance of dozing; (3)= high chance of dozing  Chance  Situtation    Sitting and  reading: 0    Watching TV: 0    Sitting Inactive in public: 0    As a passenger in car: 0      Lying down to rest: 1    Sitting and talking: 0    Sitting quielty after lunch: 0    In a car, stopped in traffic: 0   TOTAL SCORE:   1 out of 24    SLEEP STUDIES:  PSG (10/03/16) AHI 10, REM AHI 58, SPO2 79%.  TITRATION (10/06/16) CPAP at 7cmh20.   CPAP COMPLIANCE DATA:  Date Range: 08/13/21 - 08/12/22  Average Daily Use: 8 hours 21 minutes  Median Use: 8 hours 54 minutes  Compliance for > 4 Hours: 340 days  AHI: 0.7 respiratory events per hour  Days Used: 341/365  Mask Leak: 15.3  95th Percentile Pressure: 7 cmh20         LABS: Recent Results (from the past 2160 hour(s))  HM DIABETES EYE EXAM     Status: None   Collection Time: 06/19/22 12:00 AM  Result Value Ref Range   HM Diabetic Eye Exam No Retinopathy No Retinopathy  Bayer DCA Hb A1c Waived     Status: None   Collection Time: 08/05/22 10:35 AM  Result Value Ref Range   HB A1C (BAYER DCA - WAIVED) 5.6 4.8 - 5.6 %    Comment:          Prediabetes: 5.7 - 6.4          Diabetes: >6.4          Glycemic control for adults with diabetes: <7.8   Basic metabolic panel     Status: Abnormal   Collection Time: 08/05/22 10:37 AM  Result Value Ref Range   Glucose 104 (H) 70 - 99 mg/dL   BUN 13 8 - 27 mg/dL   Creatinine, Ser 0.80 0.57 - 1.00 mg/dL   eGFR 79 >59 mL/min/1.73   BUN/Creatinine Ratio 16 12 - 28  Sodium 142 134 - 144 mmol/L   Potassium 4.0 3.5 - 5.2 mmol/L   Chloride 102 96 - 106 mmol/L   CO2 26 20 - 29 mmol/L   Calcium 10.1 8.7 - 10.3 mg/dL  Lipid Panel w/o Chol/HDL Ratio     Status: Abnormal   Collection Time: 08/05/22 10:37 AM  Result Value Ref Range   Cholesterol, Total 104 100 - 199 mg/dL   Triglycerides 125 0 - 149 mg/dL   HDL 38 (L) >39 mg/dL   VLDL Cholesterol Cal 22 5 - 40 mg/dL   LDL Chol Calc (NIH) 44 0 - 99 mg/dL    Radiology: MM 3D SCREEN BREAST BILATERAL  Result  Date: 03/27/2022 CLINICAL DATA:  Screening. EXAM: DIGITAL SCREENING BILATERAL MAMMOGRAM WITH TOMOSYNTHESIS AND CAD TECHNIQUE: Bilateral screening digital craniocaudal and mediolateral oblique mammograms were obtained. Bilateral screening digital breast tomosynthesis was performed. The images were evaluated with computer-aided detection. COMPARISON:  Previous exam(s). ACR Breast Density Category b: There are scattered areas of fibroglandular density. FINDINGS: There are no findings suspicious for malignancy. IMPRESSION: No mammographic evidence of malignancy. A result letter of this screening mammogram will be mailed directly to the patient. RECOMMENDATION: Screening mammogram in one year. (Code:SM-B-01Y) BI-RADS CATEGORY  1: Negative. Electronically Signed   By: Dorise Bullion III M.D.   On: 03/27/2022 12:01    No results found.  No results found.    Assessment and Plan: Patient Active Problem List   Diagnosis Date Noted   OSA on CPAP 08/13/2021   Memory changes 01/25/2021   Bilateral carotid bruits 01/11/2020   Osteopenia 07/23/2018   Depression, major, single episode, severe ([place]) 06/17/2018   GERD (gastroesophageal reflux disease) 11/03/2017   Advanced care planning/counseling discussion 11/03/2017   Morbid obesity ([place]) 03/13/2016   Type 2 diabetes mellitus with morbid obesity ([place]) 09/15/2015   Hypertension associated with type 2 diabetes mellitus ([place]) 09/15/2015   Hyperlipidemia associated with type 2 diabetes mellitus ([place]) 09/15/2015   Primary insomnia 09/15/2015    1. OSA on CPAP The patient does tolerate PAP and reports  benefit from PAP use. The patient was reminded how to clean equipment and advised to replace supplies routinely. The patient was also counselled on weight loss. The compliance is very good. The AHI is 0.7.   OSA on cpap- CPAP continues to be medically necessary to treat this patient's OSA.  Continue with very good compliance with pap. F/u one year.   2.  CPAP use counseling CPAP Counseling: had a lengthy discussion with the patient regarding the importance of PAP therapy in management of the sleep apnea. Patient appears to understand the risk factor reduction and also understands the risks associated with untreated sleep apnea. Patient will try to make a good faith effort to remain compliant with therapy. Also instructed the patient on proper cleaning of the device including the water must be changed daily if possible and use of distilled water is preferred. Patient understands that the machine should be regularly cleaned with appropriate recommended cleaning solutions that do not damage the PAP machine for example given white vinegar and water rinses. Other methods such as ozone treatment may not be as good as these simple methods to achieve cleaning.   3. Hypertension associated with type 2 diabetes mellitus ([place]) Hypertension Counseling:   The following hypertensive lifestyle modification were recommended and discussed:  1. Limiting alcohol intake to less than 1 oz/day of ethanol:(24 oz of beer or 8 oz of wine or 2 oz  of 100-proof whiskey). 2. Take baby ASA 81 mg daily. 3. Importance of regular aerobic exercise and losing weight. 4. Reduce dietary saturated fat and cholesterol intake for overall cardiovascular health. 5. Maintaining adequate dietary potassium, calcium, and magnesium intake. 6. Regular monitoring of the blood pressure. 7. Reduce sodium intake to less than 100 mmol/day (less than 2.3 gm of sodium or less than 6 gm of sodium choride)    4. Rumination Given sleep hygiene guidance- recommended active thinking.  Methods to improve your sleep habits -Keep a consistent wind down period before your bedtime to help you relax. Do not do work-related activities around bedtime.  -Go to bed only when you are sleepy.  -Get out of bed at the same time every morning, even on holidays or weekends.  -If you haven't fallen asleep after lying  in bed in 20 minutes, do something boring and relaxing with minimal light expo sure outside the bedroom. Return to bed when you are sleepy.  -While in bed, turn the clock away from you. It'll only remind you of something you probably don't want to know.  -No napping during the day, especially after 3 pm. If a nap is unavoidable, keep it under an hour.  -Your bedroom should be quiet and dark.  -Take a warm, relaxing bath or shower! A warm bath or shower prior to bedtime increasesyour body's temperature so the subsequent cooling off period can help induce sleep.  -Exercise regularly, but not tough exercise within 6 hours or bedtime.  -Avoid eating large or fatty meals near bedtime.  -Avoid caffeine after lunch.     General Counseling: I have discussed the findings of the evaluation and examination with [place].  I have also discussed any further diagnostic evaluation thatmay be needed or ordered today. Chane verbalizes understanding of the findings of todays visit. We also reviewed her medications today and discussed drug interactions and side effects including but not limited excessive drowsiness and altered mental states. We also discussed that there is always a risk not just to her but also people around her. she has been encouraged to call the office with any questions or concerns that should arise related to todays visit.  No orders of the defined types were placed in this encounter.       I have personally obtained a history, examined the patient, evaluated laboratory and imaging results, formulated the assessment and plan and placed orders. This patient was seen today by Tressie Ellis, PA-C in collaboration with Dr. Devona Konig.   Allyne Gee, MD [facility] Diplomate ABMS Pulmonary Critical Care Medicine and Sleep Medicine

## 2022-08-12 ENCOUNTER — Ambulatory Visit (INDEPENDENT_AMBULATORY_CARE_PROVIDER_SITE_OTHER): Payer: Medicare PPO | Admitting: Internal Medicine

## 2022-08-12 VITALS — BP 127/80 | HR 81 | Resp 14 | Ht 64.0 in | Wt 197.0 lb

## 2022-08-12 DIAGNOSIS — I152 Hypertension secondary to endocrine disorders: Secondary | ICD-10-CM

## 2022-08-12 DIAGNOSIS — E1159 Type 2 diabetes mellitus with other circulatory complications: Secondary | ICD-10-CM | POA: Diagnosis not present

## 2022-08-12 DIAGNOSIS — G4733 Obstructive sleep apnea (adult) (pediatric): Secondary | ICD-10-CM | POA: Diagnosis not present

## 2022-08-12 DIAGNOSIS — Z7189 Other specified counseling: Secondary | ICD-10-CM | POA: Insufficient documentation

## 2022-08-12 DIAGNOSIS — R1110 Vomiting, unspecified: Secondary | ICD-10-CM | POA: Insufficient documentation

## 2022-08-12 DIAGNOSIS — Z9989 Dependence on other enabling machines and devices: Secondary | ICD-10-CM

## 2022-08-12 NOTE — Patient Instructions (Signed)

## 2022-08-13 DIAGNOSIS — J3089 Other allergic rhinitis: Secondary | ICD-10-CM | POA: Diagnosis not present

## 2022-08-20 DIAGNOSIS — J3089 Other allergic rhinitis: Secondary | ICD-10-CM | POA: Diagnosis not present

## 2022-08-23 ENCOUNTER — Other Ambulatory Visit: Payer: Self-pay | Admitting: Nurse Practitioner

## 2022-08-27 DIAGNOSIS — J3089 Other allergic rhinitis: Secondary | ICD-10-CM | POA: Diagnosis not present

## 2022-08-27 NOTE — Telephone Encounter (Signed)
Requested Prescriptions  Pending Prescriptions Disp Refills  . Accu-Chek Softclix Lancets lancets [Pharmacy Med Name: [place] 300 each 4    Sig: USE TO CHECK BLOOD SUGAR THREE TIMES DAILY AND DOCUMENT FOR VISITS. BRING LOG TO VISITS     Endocrinology: Diabetes - Testing Supplies Passed - 08/23/2022 10:03 AM      Passed - Valid encounter within last 12 months    Recent Outpatient Visits          3 weeks ago Type 2 diabetes mellitus with morbid obesity ([place])   [place], Jolene T, NP   6 months ago Type 2 diabetes mellitus with morbid obesity ([place])   [place], Jolene T, NP   9 months ago Type 2 diabetes mellitus with morbid obesity ([place])   [place], Barbaraann Faster, NP   11 months ago Flu vaccine need   [employer], Henrine Screws T, NP   1 year ago Depression, major, single episode, severe ([place])   [place], Barbaraann Faster, NP      Future Appointments            In 6 days Cannady, Barbaraann Faster, NP [employer]

## 2022-08-29 DIAGNOSIS — J3089 Other allergic rhinitis: Secondary | ICD-10-CM | POA: Diagnosis not present

## 2022-09-01 NOTE — Patient Instructions (Signed)

## 2022-09-02 ENCOUNTER — Ambulatory Visit: Payer: Medicare PPO | Admitting: Nurse Practitioner

## 2022-09-02 ENCOUNTER — Encounter: Payer: Self-pay | Admitting: Nurse Practitioner

## 2022-09-02 VITALS — BP 123/73 | HR 78 | Temp 98.1°F | Wt 197.7 lb

## 2022-09-02 DIAGNOSIS — E1159 Type 2 diabetes mellitus with other circulatory complications: Secondary | ICD-10-CM | POA: Diagnosis not present

## 2022-09-02 DIAGNOSIS — I152 Hypertension secondary to endocrine disorders: Secondary | ICD-10-CM

## 2022-09-02 NOTE — Assessment & Plan Note (Signed)
Chronic, stable with BP remaining at goal.  Recommend she monitor BP at least a few mornings a week at home and document.  DASH diet at home.  Continue on Benazepril-HCTZ 20-12.5 MG dosing, is tolerating this well and BP remains stable.  Labs up to date.  Return in 5 months.

## 2022-09-02 NOTE — Progress Notes (Signed)
BP 123/73   Pulse 78   Temp 98.1 F (36.7 C) (Oral)   Wt 197 lb 11.2 oz (89.7 kg)   LMP  (LMP Unknown)   SpO2 96%   BMI 33.94 kg/m    Subjective:    Patient ID: Barbara Fitzpatrick, female    DOB: 07-Aug-1951, 71 y.o.   MRN: [id number]  HPI: Barbara Fitzpatrick is a 71 y.o. female  Chief Complaint  Patient presents with   Hypertension   HYPERTENSION  Reduced her BP medications on 08/05/22 - Changed to Benazepril-HCTZ 20-12.5 MG. Dry mouth is somewhat better with these changes. Hypertension status: stable  Satisfied with current treatment? yes Duration of hypertension: chronic BP monitoring frequency:  daily BP range: 93/65 to 129/80 -- has had 4 level in 90/60-70 range -- HR 60-80 range BP medication side effects:  no Medication compliance: good compliance Aspirin: yes Recurrent headaches: no Visual changes: no Palpitations: no Dyspnea: no Chest pain: no Lower extremity edema: no Dizzy/lightheaded: a couple times with allergies  Relevant past medical, surgical, family and social history reviewed and updated as indicated. Interim medical history since our last visit reviewed. Allergies and medications reviewed and updated.  Review of Systems  Constitutional:  Negative for activity change, appetite change, diaphoresis, fatigue and fever.  Respiratory:  Negative for cough, chest tightness and shortness of breath.   Cardiovascular:  Negative for chest pain, palpitations and leg swelling.  Gastrointestinal: Negative.   Endocrine: Negative for polydipsia, polyphagia and polyuria.  Neurological: Negative.   Psychiatric/Behavioral: Negative.      Per HPI unless specifically indicated above     Objective:    BP 123/73   Pulse 78   Temp 98.1 F (36.7 C) (Oral)   Wt 197 lb 11.2 oz (89.7 kg)   LMP  (LMP Unknown)   SpO2 96%   BMI 33.94 kg/m   Wt Readings from Last 3 Encounters:  09/02/22 197 lb 11.2 oz (89.7 kg)  08/12/22 197 lb (89.4 kg)  08/05/22 196 lb 12.8 oz  (89.3 kg)    Physical Exam Vitals and nursing note reviewed.  Constitutional:      General: She is awake. She is not in acute distress.    Appearance: She is well-developed and well-groomed. She is obese. She is not ill-appearing or toxic-appearing.  HENT:     Head: Normocephalic.     Right Ear: Hearing and external ear normal.     Left Ear: Hearing and external ear normal.  Eyes:     General: Lids are normal.        Right eye: No discharge.        Left eye: No discharge.     Conjunctiva/sclera: Conjunctivae normal.     Pupils: Pupils are equal, round, and reactive to light.  Neck:     Thyroid: No thyromegaly.     Vascular: No carotid bruit.  Cardiovascular:     Rate and Rhythm: Normal rate and regular rhythm.     Heart sounds: Normal heart sounds. No murmur heard.    No gallop.  Pulmonary:     Effort: Pulmonary effort is normal. No accessory muscle usage or respiratory distress.     Breath sounds: Normal breath sounds.  Abdominal:     General: Bowel sounds are normal.     Palpations: Abdomen is soft.  Musculoskeletal:     Cervical back: Normal range of motion and neck supple.     Right lower leg: No edema.  Left lower leg: No edema.  Lymphadenopathy:     Cervical: No cervical adenopathy.  Skin:    General: Skin is warm and dry.  Neurological:     Mental Status: She is alert and oriented to person, place, and time.  Psychiatric:        Attention and Perception: Attention normal.        Mood and Affect: Mood normal.        Speech: Speech normal.        Behavior: Behavior normal. Behavior is cooperative.        Thought Content: Thought content normal.    Results for orders placed or performed in visit on 08/05/22  Bayer DCA Hb A1c Waived  Result Value Ref Range   HB A1C (BAYER DCA - WAIVED) 5.6 4.8 - 5.6 %  Basic metabolic panel  Result Value Ref Range   Glucose 104 (H) 70 - 99 mg/dL   BUN 13 8 - 27 mg/dL   Creatinine, Ser 0.80 0.57 - 1.00 mg/dL   eGFR 79 >59  mL/min/1.73   BUN/Creatinine Ratio 16 12 - 28   Sodium 142 134 - 144 mmol/L   Potassium 4.0 3.5 - 5.2 mmol/L   Chloride 102 96 - 106 mmol/L   CO2 26 20 - 29 mmol/L   Calcium 10.1 8.7 - 10.3 mg/dL  Lipid Panel w/o Chol/HDL Ratio  Result Value Ref Range   Cholesterol, Total 104 100 - 199 mg/dL   Triglycerides 125 0 - 149 mg/dL   HDL 38 (L) >39 mg/dL   VLDL Cholesterol Cal 22 5 - 40 mg/dL   LDL Chol Calc (NIH) 44 0 - 99 mg/dL      Assessment & Plan:   Problem List Items Addressed This Visit       Cardiovascular and Mediastinum   Hypertension associated with type 2 diabetes mellitus ([place]) - Primary    Chronic, stable with BP remaining at goal.  Recommend she monitor BP at least a few mornings a week at home and document.  DASH diet at home.  Continue on Benazepril-HCTZ 20-12.5 MG dosing, is tolerating this well and BP remains stable.  Labs up to date.  Return in 5 months.         Follow up plan: Return in about 5 months (around 02/06/2023) for Annual physical with diabetes check.

## 2022-09-03 DIAGNOSIS — J3089 Other allergic rhinitis: Secondary | ICD-10-CM | POA: Diagnosis not present

## 2022-09-10 DIAGNOSIS — J3089 Other allergic rhinitis: Secondary | ICD-10-CM | POA: Diagnosis not present

## 2022-09-12 ENCOUNTER — Ambulatory Visit (INDEPENDENT_AMBULATORY_CARE_PROVIDER_SITE_OTHER): Payer: Medicare PPO

## 2022-09-12 VITALS — BP 107/76 | HR 71 | Ht 64.0 in | Wt 195.0 lb

## 2022-09-12 DIAGNOSIS — Z0000 Encounter for general adult medical examination without abnormal findings: Secondary | ICD-10-CM | POA: Diagnosis not present

## 2022-09-12 NOTE — Progress Notes (Signed)
I connected with  Barbara Fitzpatrick on 09/12/22 by a audio enabled telemedicine application and verified that I am speaking with the correct person using two identifiers.  Patient Location: Home  Provider Location: Office/Clinic  I discussed the limitations of evaluation and management by telemedicine. The patient expressed understanding and agreed to proceed.   Subjective:   Barbara Fitzpatrick is a 71 y.o. female who presents for Medicare Annual (Subsequent) preventive examination.  Review of Systems    Defer to PCP.     Objective:    Today's Vitals   09/12/22 1319 09/12/22 1321  BP: 107/76   Pulse: 71   Weight: 195 lb (88.5 kg)   Height: '5\' 4"'  (1.626 m)   PainSc: 0-No pain 0-No pain   Body mass index is 33.47 kg/m.     09/07/2021    5:22 PM 08/11/2020   11:17 AM 08/02/2019   11:13 AM 06/15/2018    8:44 AM 04/10/2017    1:35 PM  Advanced Directives  Does Patient Have a Medical Advance Directive? Yes Yes Yes Yes Yes  Type of Paramedic of [place];Living will [place];Living will Living will;Healthcare Power of [place];Living will Living will;Healthcare Power of Attorney  Does patient want to make changes to medical advance directive? No - Patient declined      Copy of [place] in Chart?  No - copy requested No - copy requested No - copy requested No - copy requested    Current Medications (verified) Outpatient Encounter Medications as of 09/12/2022  Medication Sig   Accu-Chek Softclix Lancets lancets USE TO CHECK BLOOD SUGAR THREE TIMES DAILY AND DOCUMENT FOR VISITS. BRING LOG TO VISITS   acyclovir ointment (ZOVIRAX) 5 % Apply 1 application topically 3 (three) times daily as needed.   aspirin 81 MG tablet Take 81 mg by mouth daily.   atorvastatin (LIPITOR) 80 MG tablet TAKE 1 TABLET(80 MG) BY MOUTH DAILY   benazepril-hydrochlorthiazide (LOTENSIN HCT) 20-12.5 MG tablet Take 1  tablet by mouth daily.   Blood Glucose Monitoring Suppl (ONETOUCH VERIO) w/Device KIT Use to check blood sugar 3 times daily and document for visits.  Bring log to visits.   buPROPion (WELLBUTRIN XL) 300 MG 24 hr tablet TAKE 1 TABLET(300 MG) BY MOUTH DAILY   cetirizine (ZYRTEC) 10 MG tablet Take 10 mg by mouth daily.   Cholecalciferol (VITAMIN D-3 PO) Take 3,000 Units by mouth daily.    Coenzyme Q10 (CO Q-10 PO) Take by mouth daily.   EPIPEN 2-PAK 0.3 MG/0.3ML SOAJ injection as needed.    ezetimibe (ZETIA) 10 MG tablet TAKE 1 TABLET(10 MG) BY MOUTH DAILY   glucosamine-chondroitin 500-400 MG tablet Take 1 tablet by mouth 2 (two) times daily.   glucose blood test strip Use to check blood sugar 3 times daily and document for visits.  Bring log to visits.   ipratropium (ATROVENT) 0.06 % nasal spray Place into both nostrils.   Magnesium 250 MG TABS Take by mouth daily.    metFORMIN (GLUCOPHAGE) 500 MG tablet Take 1 tablet (500 mg total) by mouth 2 (two) times daily with a meal.   Multiple Vitamins-Minerals (MULTIVITAMIN PO) Take by mouth daily.   nystatin (NYSTATIN) powder Apply topically 3 (three) times daily as needed.   nystatin cream (MYCOSTATIN) Apply 1 application topically 2 (two) times daily.   omeprazole (PRILOSEC) 20 MG capsule Take 20 mg by mouth daily.   Probiotic Product (PROBIOTIC  COLON SUPPORT PO) Take by mouth. Take one pill every other day   valACYclovir (VALTREX) 1000 MG tablet Take one tablet twice daily at the first sign of a flare for about 3-5 days   vitamin B-12 (CYANOCOBALAMIN) 1000 MCG tablet Take 1,000 mcg by mouth daily.   vitamin E 1000 UNIT capsule Take 1,000 Units by mouth daily.   No facility-administered encounter medications on file as of 09/12/2022.    Allergies (verified) Patient has no known allergies.   History: Past Medical History:  Diagnosis Date   Allergy    Arthritis    Depression    Diabetes ([place])    GERD (gastroesophageal reflux disease)     Hyperlipidemia    Hypertension    Personal history of colonic adenomas 06/16/2013   4 adenomas (max 1 cm)  04/2003  no polyps 04/2005    Sleep apnea    uses c-pap   Past Surgical History:  Procedure Laterality Date   ABDOMINAL HYSTERECTOMY     COLONOSCOPY     hysterectomy     with bladder tack   POLYPECTOMY     TUBAL LIGATION     Family History  Problem Relation Age of Onset   Hypertension Mother    Hyperlipidemia Mother    Diabetes Mother    Cancer Mother        uterine or ovarian cancer   Glaucoma Mother    Heart disease Father    Hypertension Father    Hyperlipidemia Father    Diabetes Father    Diabetes Sister    Lung disease Sister        polyps   Cancer Brother        kidney- on dialysis   Glaucoma Brother    Hyperlipidemia Brother    Hypertension Brother    Cancer Maternal Aunt 48       ovarian   Stroke Maternal Grandfather    Diabetes Paternal Grandmother    Heart disease Paternal Grandfather    Esophageal cancer Neg Hx    Rectal cancer Neg Hx    Stomach cancer Neg Hx    Breast cancer Neg Hx    Colon polyps Neg Hx    Social History   Socioeconomic History   Marital status: Single    Spouse name: Not on file   Number of children: Not on file   Years of education: Not on file   Highest education level: 12th grade  Occupational History   Occupation: retired  Tobacco Use   Smoking status: Never   Smokeless tobacco: Never  Vaping Use   Vaping Use: Never used  Substance and Sexual Activity   Alcohol use: No   Drug use: No   Sexual activity: Not Currently  Other Topics Concern   Not on file  Social History Narrative   Not on file   Social Determinants of Health   Financial Resource Strain: Low Risk  (09/12/2022)   Overall Financial Resource Strain (CARDIA)    Difficulty of Paying Living Expenses: Not hard at all  Food Insecurity: No Food Insecurity (09/12/2022)   Hunger Vital Sign    Worried About Running Out of Food in the Last Year: Never  true    Ran Out of Food in the Last Year: Never true  Transportation Needs: No Transportation Needs (09/12/2022)   PRAPARE - Hydrologist (Medical): No    Lack of Transportation (Non-Medical): No  Physical Activity: Insufficiently Active (09/12/2022)  Exercise Vital Sign    Days of Exercise per Week: 2 days    Minutes of Exercise per Session: 30 min  Stress: No Stress Concern Present (09/12/2022)   [place]    Feeling of Stress : Not at all  Social Connections: Moderately Isolated (09/12/2022)   Social Connection and Isolation Panel [NHANES]    Frequency of Communication with Friends and Family: More than three times a week    Frequency of Social Gatherings with Friends and Family: More than three times a week    Attends Religious Services: More than 4 times per year    Active Member of [employer] or Organizations: No    Attends Music therapist: Never    Marital Status: Divorced    Tobacco Counseling Counseling given: Not Answered   Clinical Intake:  Pre-visit preparation completed: Yes  Pain : No/denies pain Pain Score: 0-No pain  Nutritional Risks: None Diabetes: Yes CBG done?: No Did pt. bring in CBG monitor from home?: No  How often do you need to have someone help you when you read instructions, pamphlets, or other written materials from your doctor or pharmacy?: 2 - Rarely What is the last grade level you completed in school?: 12th grade  Diabetic- Yes  Interpreter Needed?: No   Activities of Daily Living    09/12/2022    1:27 PM  In your present state of health, do you have any difficulty performing the following activities:  Hearing? 0  Vision? 0  Difficulty concentrating or making decisions? 1  Walking or climbing stairs? 0  Dressing or bathing? 0  Doing errands, shopping? 0    Patient Care Team: Venita Lick, NP as PCP - General (Nurse  Practitioner)  Indicate any recent Medical Services you may have received from other than [facility] providers in the past year (date may be approximate).     Assessment:   This is a routine wellness examination for Barbara Fitzpatrick.  Hearing/Vision screen No results found.  Dietary issues and exercise activities discussed:    Goals Addressed   None   Depression Screen    09/12/2022    1:24 PM 09/02/2022   10:31 AM 08/05/2022   10:52 AM 02/05/2022   10:43 AM 11/08/2021   10:22 AM 09/07/2021    5:17 PM 08/07/2021   10:33 AM  PHQ 2/9 Scores  PHQ - 2 Score 0 '1 2 1 1 1 1  ' PHQ- 9 Score '3 7 4 4 3  9    ' Fall Risk    09/12/2022    1:27 PM 09/02/2022   10:31 AM 11/08/2021   10:21 AM 09/07/2021    5:23 PM 01/25/2021   10:11 AM  Fall Risk   Falls in the past year? 0 0 0 0 0  Number falls in past yr: 0 0 0 0   Injury with Fall? 0 0 0 0   Risk for fall due to : No Fall Risks No Fall Risks No Fall Risks No Fall Risks   Follow up Falls evaluation completed Falls evaluation completed Falls evaluation completed Falls evaluation completed     [place]:  Any stairs in or around the home? Yes  If so, are there any without handrails? No  Home free of loose throw rugs in walkways, pet beds, electrical cords, etc? Yes  Adequate lighting in your home to reduce risk of falls? Yes   ASSISTIVE DEVICES UTILIZED  TO PREVENT FALLS:  Life alert? No  Use of a cane, walker or w/c? No  Grab bars in the bathroom? Yes  Shower chair or bench in shower? No  Elevated toilet seat or a handicapped toilet? No   TIMED UP AND GO:  Was the test performed? No .  Cognitive Function:        09/12/2022    1:27 PM 09/07/2021    5:24 PM 01/25/2021   10:41 AM 08/11/2020   11:21 AM 06/15/2018    8:52 AM  6CIT Screen  What Year? 0 points 0 points 0 points 0 points 0 points  What month? 0 points 0 points 0 points 0 points 0 points  What time? 0 points 0 points 0 points 0 points 0 points   Count back from 20 0 points 0 points 0 points 0 points 0 points  Months in reverse 0 points 0 points 2 points 0 points 0 points  Repeat phrase 2 points 0 points 0 points 2 points 0 points  Total Score 2 points 0 points 2 points 2 points 0 points    Immunizations Immunization History  Administered Date(s) Administered   Fluad Quad(high Dose 65+) 09/22/2019, 09/14/2020, 09/06/2021   Influenza, High Dose Seasonal PF 12/31/2016, 11/03/2017, 12/30/2017, 09/14/2018, 01/01/2019, 12/28/2019, 12/26/2020, 12/28/2021   Influenza,inj,Quad PF,6+ Mos 09/15/2015, 10/15/2016   Moderna Sars-Covid-2 Vaccination 02/08/2020, 03/07/2020, 12/26/2020, 07/17/2021, 11/23/2021   Pneumococcal Conjugate-13 11/03/2017   Pneumococcal Polysaccharide-23 01/01/2019, 09/22/2019, 12/28/2019, 12/26/2020, 12/28/2021   Td 08/05/2022   Tdap 04/23/2012   Zoster Recombinat (Shingrix) 10/16/2020, 02/06/2021   Zoster, Live 03/16/2014    TDAP status: Up to date  Flu Vaccine status: Due, Education has been provided regarding the importance of this vaccine. Advised may receive this vaccine at local pharmacy or Health Dept. Aware to provide a copy of the vaccination record if obtained from local pharmacy or Health Dept. Verbalized acceptance and understanding.  Pneumococcal vaccine status: Up to date  Covid-19 vaccine status: Completed vaccines  Qualifies for Shingles Vaccine? Yes   Zostavax completed Yes   Shingrix Completed?: Yes  Screening Tests Health Maintenance  Topic Date Due   COVID-19 Vaccine (6 - Moderna series) 01/18/2022   INFLUENZA VACCINE  07/23/2022   Diabetic kidney evaluation - Urine ACR  02/05/2023   FOOT EXAM  02/05/2023   HEMOGLOBIN A1C  02/05/2023   OPHTHALMOLOGY EXAM  06/20/2023   DEXA SCAN  07/23/2023   Diabetic kidney evaluation - GFR measurement  08/06/2023   MAMMOGRAM  03/26/2024   COLONOSCOPY (Pts 45-79yr Insurance coverage will need to be confirmed)  07/08/2025   TETANUS/TDAP   08/05/2032   Pneumonia Vaccine 70 Years old  Completed   Hepatitis C Screening  Completed   Zoster Vaccines- Shingrix  Completed   HPV VACCINES  Aged Out    Health Maintenance  Health Maintenance Due  Topic Date Due   COVID-19 Vaccine (6 - Moderna series) 01/18/2022   INFLUENZA VACCINE  07/23/2022    Colorectal cancer screening: Type of screening: Colonoscopy. Completed 07/08/22. Repeat every 3 years  Mammogram status: Completed 03/26/22. Repeat every year  Bone Density status: Completed 07/22/18. Results reflect: Bone density results: OSTEOPENIA. Repeat every 5 years.  Lung Cancer Screening: (Low Dose CT Chest recommended if Age 70-80years, 30 pack-year currently smoking OR have quit w/in 15years.) does not qualify.    Additional Screening:  Hepatitis C Screening: does qualify; Completed 09/15/15  Vision Screening: Recommended annual ophthalmology exams for early detection of glaucoma and  other disorders of the eye. Is the patient up to date with their annual eye exam?  Yes  Who is the provider or what is the name of the office in which the patient attends annual eye exams? [facility] If pt is not established with a provider, would they like to be referred to a provider to establish care? No .   Dental Screening: Recommended annual dental exams for proper oral hygiene  Community Resource Referral / Chronic Care Management: CRR required this visit?  No   CCM required this visit?  No    Plan:     I have personally reviewed and noted the following in the patient's chart:   Medical and social history Use of alcohol, tobacco or illicit drugs  Current medications and supplements including opioid prescriptions. Patient is not currently taking opioid prescriptions. Functional ability and status Nutritional status Physical activity Advanced directives List of other physicians Hospitalizations, surgeries, and ER visits in previous 12 months Vitals Screenings  to include cognitive, depression, and falls Referrals and appointments  In addition, I have reviewed and discussed with patient certain preventive protocols, quality metrics, and best practice recommendations. A written personalized care plan for preventive services as well as general preventive health recommendations were provided to patient.     Georgina Peer, [place]   09/12/2022   Nurse Notes: Non Face to Face 30 minutes      Barbara Fitzpatrick , Thank you for taking time to come for your Medicare Wellness Visit. I appreciate your ongoing commitment to your health goals. Please review the following plan we discussed and let me know if I can assist you in the future.   These are the goals we discussed:  Goals      DIET - INCREASE WATER INTAKE     Recommend drinking at least 6-8 glasses of water a day      Patient Stated     08/11/2020, keeping a diary of meals        This is a list of the screening recommended for you and due dates:  Health Maintenance  Topic Date Due   COVID-19 Vaccine (6 - Moderna series) 01/18/2022   Flu Shot  07/23/2022   Yearly kidney health urinalysis for diabetes  02/05/2023   Complete foot exam   02/05/2023   Hemoglobin A1C  02/05/2023   Eye exam for diabetics  06/20/2023   DEXA scan (bone density measurement)  07/23/2023   Yearly kidney function blood test for diabetes  08/06/2023   Mammogram  03/26/2024   Colon Cancer Screening  07/08/2025   Tetanus Vaccine  08/05/2032   Pneumonia Vaccine  Completed   Hepatitis C Screening: USPSTF Recommendation to screen - Ages 18-79 yo.  Completed   Zoster (Shingles) Vaccine  Completed   HPV Vaccine  Aged Out

## 2022-09-12 NOTE — Patient Instructions (Signed)

## 2022-09-17 DIAGNOSIS — J3089 Other allergic rhinitis: Secondary | ICD-10-CM | POA: Diagnosis not present

## 2022-09-30 ENCOUNTER — Other Ambulatory Visit: Payer: Self-pay | Admitting: Nurse Practitioner

## 2022-10-01 DIAGNOSIS — J3089 Other allergic rhinitis: Secondary | ICD-10-CM | POA: Diagnosis not present

## 2022-10-01 NOTE — Telephone Encounter (Signed)
Requested Prescriptions  Pending Prescriptions Disp Refills  . ACCU-CHEK GUIDE test strip [Pharmacy Med Name: ACCU-CHEK GUIDE TEST STRIPS 100S] 300 strip     Sig: TEST THREE TIMES DAILY     Endocrinology: Diabetes - Testing Supplies Passed - 09/30/2022 10:56 AM      Passed - Valid encounter within last 12 months    Recent Outpatient Visits          4 weeks ago Hypertension associated with type 2 diabetes mellitus ([place])   [place], Jolene T, NP   1 month ago Type 2 diabetes mellitus with morbid obesity ([place])   [place], Jolene T, NP   7 months ago Type 2 diabetes mellitus with morbid obesity ([place])   [place] Cannady, Jolene T, NP   10 months ago Type 2 diabetes mellitus with morbid obesity ([place])   [place] Cannady, Henrine Screws T, NP   1 year ago Flu vaccine need   [place], Barbaraann Faster, NP      Future Appointments            In 4 months Cannady, Barbaraann Faster, NP [facility]

## 2022-10-08 DIAGNOSIS — J301 Allergic rhinitis due to pollen: Secondary | ICD-10-CM | POA: Diagnosis not present

## 2022-10-08 DIAGNOSIS — J3081 Allergic rhinitis due to animal (cat) (dog) hair and dander: Secondary | ICD-10-CM | POA: Diagnosis not present

## 2022-10-08 DIAGNOSIS — J3089 Other allergic rhinitis: Secondary | ICD-10-CM | POA: Diagnosis not present

## 2022-10-14 DIAGNOSIS — D3122 Benign neoplasm of left retina: Secondary | ICD-10-CM | POA: Diagnosis not present

## 2022-10-14 DIAGNOSIS — H524 Presbyopia: Secondary | ICD-10-CM | POA: Diagnosis not present

## 2022-10-14 DIAGNOSIS — H43813 Vitreous degeneration, bilateral: Secondary | ICD-10-CM | POA: Diagnosis not present

## 2022-10-14 DIAGNOSIS — E119 Type 2 diabetes mellitus without complications: Secondary | ICD-10-CM | POA: Diagnosis not present

## 2022-10-14 LAB — HM DIABETES EYE EXAM

## 2022-10-18 DIAGNOSIS — J3089 Other allergic rhinitis: Secondary | ICD-10-CM | POA: Diagnosis not present

## 2022-10-21 ENCOUNTER — Encounter (INDEPENDENT_AMBULATORY_CARE_PROVIDER_SITE_OTHER): Payer: Self-pay

## 2022-10-24 DIAGNOSIS — J3081 Allergic rhinitis due to animal (cat) (dog) hair and dander: Secondary | ICD-10-CM | POA: Diagnosis not present

## 2022-10-24 DIAGNOSIS — J3089 Other allergic rhinitis: Secondary | ICD-10-CM | POA: Diagnosis not present

## 2022-10-24 DIAGNOSIS — J301 Allergic rhinitis due to pollen: Secondary | ICD-10-CM | POA: Diagnosis not present

## 2022-10-28 ENCOUNTER — Other Ambulatory Visit: Payer: Self-pay | Admitting: Nurse Practitioner

## 2022-10-29 NOTE — Telephone Encounter (Signed)
Requested Prescriptions  Pending Prescriptions Disp Refills   benazepril-hydrochlorthiazide (LOTENSIN HCT) 20-12.5 MG tablet [Pharmacy Med Name: BENAZEPRIL/HCTZ 20/12.5MG TABLETS] 90 tablet 1    Sig: TAKE 1 TABLET BY MOUTH DAILY     Cardiovascular:  ACEI + Diuretic Combos Passed - 10/28/2022  8:59 AM      Passed - Na in normal range and within 180 days    Sodium  Date Value Ref Range Status  08/05/2022 142 134 - 144 mmol/L Final         Passed - K in normal range and within 180 days    Potassium  Date Value Ref Range Status  08/05/2022 4.0 3.5 - 5.2 mmol/L Final         Passed - Cr in normal range and within 180 days    Creatinine, Ser  Date Value Ref Range Status  08/05/2022 0.80 0.57 - 1.00 mg/dL Final         Passed - eGFR is 30 or above and within 180 days    GFR calc Af Amer  Date Value Ref Range Status  01/25/2021 77 >59 mL/min/1.73 Final    Comment:    **In accordance with recommendations from the NKF-ASN Task force,**   [facility] is in the process of updating its eGFR calculation to the   2021 CKD-EPI creatinine equation that estimates kidney function   without a race variable.    GFR calc non Af Amer  Date Value Ref Range Status  01/25/2021 67 >59 mL/min/1.73 Final   eGFR  Date Value Ref Range Status  08/05/2022 79 >59 mL/min/1.73 Final         Passed - Patient is not pregnant      Passed - Last BP in normal range    BP Readings from Last 1 Encounters:  09/12/22 107/76         Passed - Valid encounter within last 6 months    Recent Outpatient Visits           1 month ago Hypertension associated with type 2 diabetes mellitus ([place])   [place], Jolene T, NP   2 months ago Type 2 diabetes mellitus with morbid obesity ([place])   [place], Jolene T, NP   8 months ago Type 2 diabetes mellitus with morbid obesity ([place])   [place] Cannady, Jolene T, NP   11 months ago Type 2 diabetes mellitus with  morbid obesity ([place])   [place] Cannady, Henrine Screws T, NP   1 year ago Flu vaccine need   [place], Barbaraann Faster, NP       Future Appointments             In 3 months Cannady, Barbaraann Faster, NP [facility]

## 2022-10-31 DIAGNOSIS — J301 Allergic rhinitis due to pollen: Secondary | ICD-10-CM | POA: Diagnosis not present

## 2022-10-31 DIAGNOSIS — J3081 Allergic rhinitis due to animal (cat) (dog) hair and dander: Secondary | ICD-10-CM | POA: Diagnosis not present

## 2022-10-31 DIAGNOSIS — J3089 Other allergic rhinitis: Secondary | ICD-10-CM | POA: Diagnosis not present

## 2022-11-07 DIAGNOSIS — J3089 Other allergic rhinitis: Secondary | ICD-10-CM | POA: Diagnosis not present

## 2022-11-07 DIAGNOSIS — J3081 Allergic rhinitis due to animal (cat) (dog) hair and dander: Secondary | ICD-10-CM | POA: Diagnosis not present

## 2022-11-07 DIAGNOSIS — J301 Allergic rhinitis due to pollen: Secondary | ICD-10-CM | POA: Diagnosis not present

## 2022-11-12 DIAGNOSIS — J3089 Other allergic rhinitis: Secondary | ICD-10-CM | POA: Diagnosis not present

## 2022-11-28 DIAGNOSIS — J301 Allergic rhinitis due to pollen: Secondary | ICD-10-CM | POA: Diagnosis not present

## 2022-11-28 DIAGNOSIS — J3081 Allergic rhinitis due to animal (cat) (dog) hair and dander: Secondary | ICD-10-CM | POA: Diagnosis not present

## 2022-11-28 DIAGNOSIS — J3089 Other allergic rhinitis: Secondary | ICD-10-CM | POA: Diagnosis not present

## 2022-12-03 ENCOUNTER — Telehealth: Payer: Self-pay | Admitting: Nurse Practitioner

## 2022-12-03 NOTE — Telephone Encounter (Signed)
Copied from [place] [id number]. Topic: General - Inquiry >> Dec 03, 2022  2:52 PM Penni Bombard wrote: Reason for CRM: Roman with [facility]. Called asking for the recent chart notes to ok the prescription rist and shoulder support. FAX #  [phone].

## 2022-12-10 DIAGNOSIS — J3081 Allergic rhinitis due to animal (cat) (dog) hair and dander: Secondary | ICD-10-CM | POA: Diagnosis not present

## 2022-12-10 DIAGNOSIS — J3089 Other allergic rhinitis: Secondary | ICD-10-CM | POA: Diagnosis not present

## 2022-12-10 DIAGNOSIS — J301 Allergic rhinitis due to pollen: Secondary | ICD-10-CM | POA: Diagnosis not present

## 2022-12-10 NOTE — Telephone Encounter (Signed)
Last office note faxed to [facility]

## 2022-12-10 NOTE — Telephone Encounter (Signed)
Roman from [facility] is calling again about needing the most recent chart notes so he can process the prescription for the wrist and shoulder support that was signed by Henrine Screws / he stated he needs to process this by tomorrow / please advise asap   Fax# [phone] or [phone]

## 2022-12-20 ENCOUNTER — Other Ambulatory Visit: Payer: Self-pay | Admitting: Nurse Practitioner

## 2022-12-20 DIAGNOSIS — J3089 Other allergic rhinitis: Secondary | ICD-10-CM | POA: Diagnosis not present

## 2022-12-21 NOTE — Telephone Encounter (Signed)
Requested Prescriptions  Pending Prescriptions Disp Refills   valACYclovir (VALTREX) 1000 MG tablet [Pharmacy Med Name: VALACYCLOVIR 1GM TABLETS] 20 tablet 6    Sig: TAKE 1 TABLET BY MOUTH TWICE DAILY AT ONSET OF [place] FOR 3 TO 5 DAYS     Antimicrobials:  Antiviral Agents - Anti-Herpetic Passed - 12/20/2022 11:03 AM      Passed - Valid encounter within last 12 months    Recent Outpatient Visits           3 months ago Hypertension associated with type 2 diabetes mellitus ([place])   [place] Cannady, Jolene T, NP   4 months ago Type 2 diabetes mellitus with morbid obesity ([place])   [place], Jolene T, NP   10 months ago Type 2 diabetes mellitus with morbid obesity ([place])   [place], [place] T, NP   1 year ago Type 2 diabetes mellitus with morbid obesity ([place])   [place], Henrine Screws T, NP   1 year ago Flu vaccine need   [place], Barbaraann Faster, NP       Future Appointments             In 1 month Cannady, Barbaraann Faster, NP [facility]

## 2022-12-26 DIAGNOSIS — J3089 Other allergic rhinitis: Secondary | ICD-10-CM | POA: Diagnosis not present

## 2022-12-27 DIAGNOSIS — H1045 Other chronic allergic conjunctivitis: Secondary | ICD-10-CM | POA: Diagnosis not present

## 2022-12-27 DIAGNOSIS — J3089 Other allergic rhinitis: Secondary | ICD-10-CM | POA: Diagnosis not present

## 2023-01-02 DIAGNOSIS — J3089 Other allergic rhinitis: Secondary | ICD-10-CM | POA: Diagnosis not present

## 2023-01-02 DIAGNOSIS — J301 Allergic rhinitis due to pollen: Secondary | ICD-10-CM | POA: Diagnosis not present

## 2023-01-02 DIAGNOSIS — J3081 Allergic rhinitis due to animal (cat) (dog) hair and dander: Secondary | ICD-10-CM | POA: Diagnosis not present

## 2023-01-09 DIAGNOSIS — J301 Allergic rhinitis due to pollen: Secondary | ICD-10-CM | POA: Diagnosis not present

## 2023-01-09 DIAGNOSIS — J3081 Allergic rhinitis due to animal (cat) (dog) hair and dander: Secondary | ICD-10-CM | POA: Diagnosis not present

## 2023-01-09 DIAGNOSIS — J3089 Other allergic rhinitis: Secondary | ICD-10-CM | POA: Diagnosis not present

## 2023-01-16 DIAGNOSIS — J3081 Allergic rhinitis due to animal (cat) (dog) hair and dander: Secondary | ICD-10-CM | POA: Diagnosis not present

## 2023-01-16 DIAGNOSIS — J301 Allergic rhinitis due to pollen: Secondary | ICD-10-CM | POA: Diagnosis not present

## 2023-01-16 DIAGNOSIS — J3089 Other allergic rhinitis: Secondary | ICD-10-CM | POA: Diagnosis not present

## 2023-01-23 DIAGNOSIS — J3089 Other allergic rhinitis: Secondary | ICD-10-CM | POA: Diagnosis not present

## 2023-01-23 DIAGNOSIS — J301 Allergic rhinitis due to pollen: Secondary | ICD-10-CM | POA: Diagnosis not present

## 2023-01-23 DIAGNOSIS — J3081 Allergic rhinitis due to animal (cat) (dog) hair and dander: Secondary | ICD-10-CM | POA: Diagnosis not present

## 2023-02-06 DIAGNOSIS — J3089 Other allergic rhinitis: Secondary | ICD-10-CM | POA: Diagnosis not present

## 2023-02-06 DIAGNOSIS — J3081 Allergic rhinitis due to animal (cat) (dog) hair and dander: Secondary | ICD-10-CM | POA: Diagnosis not present

## 2023-02-06 DIAGNOSIS — J301 Allergic rhinitis due to pollen: Secondary | ICD-10-CM | POA: Diagnosis not present

## 2023-02-07 DIAGNOSIS — G4733 Obstructive sleep apnea (adult) (pediatric): Secondary | ICD-10-CM | POA: Diagnosis not present

## 2023-02-09 NOTE — Patient Instructions (Signed)
Diabetes Mellitus Basics  Diabetes mellitus, or diabetes, is a long-term (chronic) disease. It occurs when the body does not properly use sugar (glucose) that is released from food after you eat. Diabetes mellitus may be caused by one or both of these problems: Your pancreas does not make enough of a hormone called insulin. Your body does not react in a normal way to the insulin that it makes. Insulin lets glucose enter cells in your body. This gives you energy. If you have diabetes, glucose cannot get into cells. This causes high blood glucose (hyperglycemia). How to treat and manage diabetes You may need to take insulin or other diabetes medicines daily to keep your glucose in balance. If you are prescribed insulin, you will learn how to give yourself insulin by injection. You may need to adjust the amount of insulin you take based on the foods that you eat. You will need to check your blood glucose levels using a glucose monitor as told by your health care provider. The readings can help determine if you have low or high blood glucose. Generally, you should have these blood glucose levels: Before meals (preprandial): 80-130 mg/dL (4.4-7.2 mmol/L). After meals (postprandial): below 180 mg/dL (10 mmol/L). Hemoglobin A1c (HbA1c) level: less than 7%. Your health care provider will set treatment goals for you. Keep all follow-up visits. This is important. Follow these instructions at home: Diabetes medicines Take your diabetes medicines every day as told by your health care provider. List your diabetes medicines here: Name of medicine: ______________________________ Amount (dose): _______________ Time (a.m./p.m.): _______________ Notes: ___________________________________ Name of medicine: ______________________________ Amount (dose): _______________ Time (a.m./p.m.): _______________ Notes: ___________________________________ Name of medicine: ______________________________ Amount (dose):  _______________ Time (a.m./p.m.): _______________ Notes: ___________________________________ Insulin If you use insulin, list the types of insulin you use here: Insulin type: ______________________________ Amount (dose): _______________ Time (a.m./p.m.): _______________Notes: ___________________________________ Insulin type: ______________________________ Amount (dose): _______________ Time (a.m./p.m.): _______________ Notes: ___________________________________ Insulin type: ______________________________ Amount (dose): _______________ Time (a.m./p.m.): _______________ Notes: ___________________________________ Insulin type: ______________________________ Amount (dose): _______________ Time (a.m./p.m.): _______________ Notes: ___________________________________ Insulin type: ______________________________ Amount (dose): _______________ Time (a.m./p.m.): _______________ Notes: ___________________________________ Managing blood glucose  Check your blood glucose levels using a glucose monitor as told by your health care provider. Write down the times that you check your glucose levels here: Time: _______________ Notes: ___________________________________ Time: _______________ Notes: ___________________________________ Time: _______________ Notes: ___________________________________ Time: _______________ Notes: ___________________________________ Time: _______________ Notes: ___________________________________ Time: _______________ Notes: ___________________________________  Low blood glucose Low blood glucose (hypoglycemia) is when glucose is at or below 70 mg/dL (3.9 mmol/L). Symptoms may include: Feeling: Hungry. Sweaty and clammy. Irritable or easily upset. Dizzy. Sleepy. Having: A fast heartbeat. A headache. A change in your vision. Numbness around the mouth, lips, or tongue. Having trouble with: Moving (coordination). Sleeping. Treating low blood glucose To treat low blood  glucose, eat or drink something containing sugar right away. If you can think clearly and swallow safely, follow the 15:15 rule: Take 15 grams of a fast-acting carb (carbohydrate), as told by your health care provider. Some fast-acting carbs are: Glucose tablets: take 3-4 tablets. Hard candy: eat 3-5 pieces. Fruit juice: drink 4 oz (120 mL). Regular (not diet) soda: drink 4-6 oz (120-180 mL). Honey or sugar: eat 1 Tbsp (15 mL). Check your blood glucose levels 15 minutes after you take the carb. If your glucose is still at or below 70 mg/dL (3.9 mmol/L), take 15 grams of a carb again. If your glucose does not go above 70 mg/dL (3.9 mmol/L) after   3 tries, get help right away. After your glucose goes back to normal, eat a meal or a snack within 1 hour. Treating very low blood glucose If your glucose is at or below 54 mg/dL (3 mmol/L), you have very low blood glucose (severe hypoglycemia). This is an emergency. Do not wait to see if the symptoms will go away. Get medical help right away. Call your local emergency services (911 in the [place] Do not drive yourself to the hospital. Questions to ask your health care provider Should I talk with a diabetes educator? What equipment will I need to care for myself at home? What diabetes medicines do I need? When should I take them? How often do I need to check my blood glucose levels? What number can I call if I have questions? When is my follow-up visit? Where can I find a support group for people with diabetes? Where to find more information American Diabetes Association: www.diabetes.org Association of Diabetes [employer] and Education Specialists: www.diabeteseducator.org Contact a health care provider if: Your blood glucose is at or above 240 mg/dL (13.3 mmol/L) for 2 days in a row. You have been sick or have had a fever for 2 days or more, and you are not getting better. You have any of these problems for more than 6 hours: You cannot eat or  drink. You feel nauseous. You vomit. You have diarrhea. Get help right away if: Your blood glucose is lower than 54 mg/dL (3 mmol/L). You get confused. You have trouble thinking clearly. You have trouble breathing. These symptoms may represent a serious problem that is an emergency. Do not wait to see if the symptoms will go away. Get medical help right away. Call your local emergency services (911 in the [place] Do not drive yourself to the hospital. Summary Diabetes mellitus is a chronic disease that occurs when the body does not properly use sugar (glucose) that is released from food after you eat. Take insulin and diabetes medicines as told. Check your blood glucose every day, as often as told. Keep all follow-up visits. This is important. This information is not intended to replace advice given to you by your health care provider. Make sure you discuss any questions you have with your health care provider. Document Revised: 04/11/2020 Document Reviewed: 04/11/2020 [employer] Patient Education  2023 [employer]  

## 2023-02-12 ENCOUNTER — Ambulatory Visit: Payer: Medicare PPO | Admitting: Nurse Practitioner

## 2023-02-12 ENCOUNTER — Encounter: Payer: Self-pay | Admitting: Nurse Practitioner

## 2023-02-12 DIAGNOSIS — M8588 Other specified disorders of bone density and structure, other site: Secondary | ICD-10-CM | POA: Diagnosis not present

## 2023-02-12 DIAGNOSIS — R0989 Other specified symptoms and signs involving the circulatory and respiratory systems: Secondary | ICD-10-CM | POA: Diagnosis not present

## 2023-02-12 DIAGNOSIS — K219 Gastro-esophageal reflux disease without esophagitis: Secondary | ICD-10-CM | POA: Diagnosis not present

## 2023-02-12 DIAGNOSIS — F322 Major depressive disorder, single episode, severe without psychotic features: Secondary | ICD-10-CM

## 2023-02-12 DIAGNOSIS — E1159 Type 2 diabetes mellitus with other circulatory complications: Secondary | ICD-10-CM

## 2023-02-12 DIAGNOSIS — G4733 Obstructive sleep apnea (adult) (pediatric): Secondary | ICD-10-CM | POA: Diagnosis not present

## 2023-02-12 DIAGNOSIS — E1169 Type 2 diabetes mellitus with other specified complication: Secondary | ICD-10-CM | POA: Diagnosis not present

## 2023-02-12 DIAGNOSIS — Z0000 Encounter for general adult medical examination without abnormal findings: Secondary | ICD-10-CM

## 2023-02-12 DIAGNOSIS — E785 Hyperlipidemia, unspecified: Secondary | ICD-10-CM

## 2023-02-12 DIAGNOSIS — I152 Hypertension secondary to endocrine disorders: Secondary | ICD-10-CM | POA: Diagnosis not present

## 2023-02-12 DIAGNOSIS — F5101 Primary insomnia: Secondary | ICD-10-CM | POA: Diagnosis not present

## 2023-02-12 DIAGNOSIS — Z23 Encounter for immunization: Secondary | ICD-10-CM

## 2023-02-12 DIAGNOSIS — Z1231 Encounter for screening mammogram for malignant neoplasm of breast: Secondary | ICD-10-CM | POA: Diagnosis not present

## 2023-02-12 MED ORDER — BENAZEPRIL-HYDROCHLOROTHIAZIDE 20-12.5 MG PO TABS: 1.0000 | ORAL_TABLET | Freq: Every day | ORAL | 4 refills | Status: DC

## 2023-02-12 NOTE — Assessment & Plan Note (Addendum)
Chronic, stable.  BP at goal in office today and on home readings.  Recommend she monitor BP at least a few mornings a week at home and document.  DASH diet at home.  Continue on Benazepril-HCTZ 20-12.5 MG dosing, is tolerating this well and BP remains stable.  Labs: CBC, CMP, TSH, urine ALB.  Return in 6 months.

## 2023-02-12 NOTE — Assessment & Plan Note (Signed)
Chronic, ongoing.  Continue current medication regimen, Wellbutrin 300 MG daily.  Denies SI/HI -- is aware to notify provider immediately if SI presents or plan.  No medication adjustment at this time.

## 2023-02-12 NOTE — Assessment & Plan Note (Signed)
BMI 33.75, maintaining loss.  Recommended eating smaller high protein, low fat meals more frequently and exercising 30 mins a day 5 times a week with a goal of 10-15lb weight loss in the next 3 months. Patient voiced their understanding and motivation to adhere to these recommendations.

## 2023-02-12 NOTE — Progress Notes (Signed)
BP 131/84   Pulse 79   Temp 98.2 F (36.8 C) (Oral)   Ht 5' 4.02" (1.626 m)   Wt 196 lb 11.2 oz (89.2 kg)   LMP  (LMP Unknown)   SpO2 99%   BMI 33.75 kg/m    Subjective:    Patient ID: Barbara Fitzpatrick, female    DOB: 06/14/1951, 72 y.o.   MRN: [id number]  HPI: Barbara Fitzpatrick is a 72 y.o. female presenting on 02/12/2023 for comprehensive medical examination. She does not have any current medical complaints today.  She currently lives with: husband Menopausal Symptoms: no    DIABETES Diagnosed in August 2022 with A1c 7.7% .  A1c last visit was 5.6% in August.  Taking Metformin 500 MG BID. Maintaining weight loss. Has been working on diet changes.  She is taking daily B12.   Hypoglycemic episodes:no Polydipsia/polyuria: no Visual disturbance: no Chest pain: no Paresthesias: no Glucose Monitoring: yes             Accucheck frequency: TID             Fasting glucose: 92 to 140 via her log at different times             Post prandial:              Evening:              Before meals: Taking Insulin?: no             Long acting insulin:             Short acting insulin: Blood Pressure Monitoring: not checking Retinal Examination: Up to Date -- Dr. Delton Prairie at [facility] Foot Exam: Up to Date Pneumovax: Up to Date Influenza: Up to Date Aspirin: no    HYPERTENSION/HLD Continues on Accupril 40 MG + HCTZ 25 MG & Lipitor 80 MG + Zetia. Does have CPAP and uses 100% of the time.   Takes occasional Prilosec for heart burn. Hypertension status: controlled  Satisfied with current treatment? yes Duration of hypertension: chronic BP monitoring frequency: a few times a week BP range: 93/65 to 126/77 -- HR in 60-80 range BP medication side effects:  no Medication compliance: good compliance Previous BP meds: none Aspirin: yes Recurrent headaches: none Visual changes: no Palpitations: no Dyspnea: no Chest pain: no Lower extremity edema: no Dizzy/lightheaded: no    OSTEOPENIA Noted on DEXA 2019. Satisfied with current treatment?: yes Adequate calcium & vitamin D: yes Weight bearing exercises: yes    DEPRESSION Continues on Wellbutrin XL daily. Mood status: controlled Satisfied with current treatment?: is satisfied with medicaiton Symptom severity: mild  Duration of current treatment : chronic Side effects: no Medication compliance: excellent compliance Psychotherapy/counseling: no never gone Previous psychiatric medications:none Depressed mood: yes Anxious mood: yes Anhedonia: no Significant weight loss or gain: yes Insomnia: occasional Fatigue: no Feelings of worthlessness or guilt: no Impaired concentration/indecisiveness: no Suicidal ideations: no Hopelessness: no Crying spells: no    02/12/2023    9:47 AM 09/12/2022    1:24 PM 09/02/2022   10:31 AM 08/05/2022   10:52 AM 02/05/2022   10:43 AM  Depression screen PHQ 2/9  Decreased Interest 1 0 0 1 0  Down, Depressed, Hopeless 1 0 1 1 1  $ PHQ - 2 Score 2 0 1 2 1  $ Altered sleeping 1 1 1 1 1  $ Tired, decreased energy 2 1 2 $ 0 1  Change in appetite 2 1 2 $ 0 1  Feeling bad or failure about yourself  0 0 0 0 0  Trouble concentrating 0 0 0 1 0  Moving slowly or fidgety/restless 0 0 1 0 0  Suicidal thoughts 0 0 0 0 0  PHQ-9 Score 7 3 7 4 4  $ Difficult doing work/chores  Not difficult at all Somewhat difficult Not difficult at all Not difficult at all       02/12/2023    9:48 AM 09/02/2022   10:32 AM 08/05/2022   10:53 AM 02/05/2022   10:44 AM  GAD 7 : Generalized Anxiety Score  Nervous, Anxious, on Edge 1 1 1 1  $ Control/stop worrying 1 0 1 0  Worry too much - different things 1 1 1 1  $ Trouble relaxing 1 1 1 $ 0  Restless 0 1 0 0  Easily annoyed or irritable 1 0 0 0  Afraid - awful might happen 0 0 1 0  Total GAD 7 Score 5 4 5 2  $ Anxiety Difficulty Somewhat difficult Somewhat difficult Not difficult at all       09/07/2021    5:23 PM 11/08/2021   10:21 AM 09/02/2022   10:31 AM  09/12/2022    1:27 PM 02/12/2023    9:47 AM  Fall Risk  Falls in the past year? 0 0 0 0 0  Was there an injury with Fall? 0 0 0 0 0  Fall Risk Category Calculator 0 0 0 0 0  Fall Risk Category (Retired) Low Low Low Low   (RETIRED) Patient Fall Risk Level Low fall risk Low fall risk Low fall risk Low fall risk   Patient at Risk for Falls Due to No Fall Risks No Fall Risks No Fall Risks No Fall Risks No Fall Risks  Fall risk Follow up Falls evaluation completed Falls evaluation completed Falls evaluation completed Falls evaluation completed Falls evaluation completed    Functional Status Survey: Is the patient deaf or have difficulty hearing?: No Does the patient have difficulty seeing, even when wearing glasses/contacts?: No Does the patient have difficulty concentrating, remembering, or making decisions?: No Does the patient have difficulty walking or climbing stairs?: No Does the patient have difficulty dressing or bathing?: No Does the patient have difficulty doing errands alone such as visiting a doctor's office or shopping?: No    Past Medical History:  Past Medical History:  Diagnosis Date   Allergy    Arthritis    Depression    Diabetes ([place])    GERD (gastroesophageal reflux disease)    Hyperlipidemia    Hypertension    Personal history of colonic adenomas 06/16/2013   4 adenomas (max 1 cm)  04/2003  no polyps 04/2005    Sleep apnea    uses c-pap    Surgical History:  Past Surgical History:  Procedure Laterality Date   ABDOMINAL HYSTERECTOMY     COLONOSCOPY     hysterectomy     with bladder tack   POLYPECTOMY     TUBAL LIGATION      Medications:  Current Outpatient Medications on File Prior to Visit  Medication Sig   ACCU-CHEK GUIDE test strip TEST THREE TIMES DAILY   Accu-Chek Softclix Lancets lancets USE TO CHECK BLOOD SUGAR THREE TIMES DAILY AND DOCUMENT FOR VISITS. BRING LOG TO VISITS   acyclovir ointment (ZOVIRAX) 5 % Apply 1 application topically 3 (three)  times daily as needed.   aspirin 81 MG tablet Take 81 mg by mouth daily.   atorvastatin (LIPITOR) 80 MG tablet  TAKE 1 TABLET(80 MG) BY MOUTH DAILY   Blood Glucose Monitoring Suppl (ONETOUCH VERIO) w/Device KIT Use to check blood sugar 3 times daily and document for visits.  Bring log to visits.   buPROPion (WELLBUTRIN XL) 300 MG 24 hr tablet TAKE 1 TABLET(300 MG) BY MOUTH DAILY   cetirizine (ZYRTEC) 10 MG tablet Take 10 mg by mouth daily.   Cholecalciferol (VITAMIN D-3 PO) Take 3,000 Units by mouth daily.    Coenzyme Q10 (CO Q-10 PO) Take by mouth daily.   EPIPEN 2-PAK 0.3 MG/0.3ML SOAJ injection as needed.    ezetimibe (ZETIA) 10 MG tablet TAKE 1 TABLET(10 MG) BY MOUTH DAILY   glucosamine-chondroitin 500-400 MG tablet Take 1 tablet by mouth 2 (two) times daily.   ipratropium (ATROVENT) 0.06 % nasal spray Place into both nostrils.   Magnesium 250 MG TABS Take by mouth daily.    metFORMIN (GLUCOPHAGE) 500 MG tablet Take 1 tablet (500 mg total) by mouth 2 (two) times daily with a meal.   Multiple Vitamins-Minerals (MULTIVITAMIN PO) Take by mouth daily.   nystatin (NYSTATIN) powder Apply topically 3 (three) times daily as needed.   nystatin cream (MYCOSTATIN) Apply 1 application topically 2 (two) times daily.   Olopatadine HCl 0.6 % SOLN Place 2 sprays into both nostrils 2 (two) times daily.   omeprazole (PRILOSEC) 20 MG capsule Take 20 mg by mouth daily.   Probiotic Product (PROBIOTIC COLON SUPPORT PO) Take by mouth. Take one pill every other day   valACYclovir (VALTREX) 1000 MG tablet TAKE 1 TABLET BY MOUTH TWICE DAILY AT ONSET OF FLARES FOR 3 TO 5 DAYS   vitamin B-12 (CYANOCOBALAMIN) 1000 MCG tablet Take 1,000 mcg by mouth daily.   vitamin E 1000 UNIT capsule Take 1,000 Units by mouth daily.   No current facility-administered medications on file prior to visit.    Allergies:  No Known Allergies  Social History:  Social History   Socioeconomic History   Marital status: Single     Spouse name: Not on file   Number of children: Not on file   Years of education: Not on file   Highest education level: 12th grade  Occupational History   Occupation: retired  Tobacco Use   Smoking status: Never   Smokeless tobacco: Never  Vaping Use   Vaping Use: Never used  Substance and Sexual Activity   Alcohol use: No   Drug use: No   Sexual activity: Not Currently  Other Topics Concern   Not on file  Social History Narrative   Not on file   Social Determinants of Health   Financial Resource Strain: Low Risk  (09/12/2022)   Overall Financial Resource Strain (CARDIA)    Difficulty of Paying Living Expenses: Not hard at all  Food Insecurity: No Food Insecurity (09/12/2022)   Hunger Vital Sign    Worried About Running Out of Food in the Last Year: Never true    [place] in the Last Year: Never true  Transportation Needs: No Transportation Needs (09/12/2022)   PRAPARE - Hydrologist (Medical): No    Lack of Transportation (Non-Medical): No  Physical Activity: Insufficiently Active (09/12/2022)   Exercise Vital Sign    Days of Exercise per Week: 2 days    Minutes of Exercise per Session: 30 min  Stress: No Stress Concern Present (09/12/2022)   [place]    Feeling of Stress : Not at  all  Social Connections: Moderately Isolated (09/12/2022)   Social Connection and Isolation Panel [NHANES]    Frequency of Communication with Friends and Family: More than three times a week    Frequency of Social Gatherings with Friends and Family: More than three times a week    Attends Religious Services: More than 4 times per year    Active Member of [employer] or Organizations: No    Attends Archivist Meetings: Never    Marital Status: Divorced  Human resources officer Violence: Not At Risk (09/12/2022)   Humiliation, Afraid, Rape, and Kick questionnaire    Fear of Current or Ex-Partner: No     Emotionally Abused: No    Physically Abused: No    Sexually Abused: No   Social History   Tobacco Use  Smoking Status Never  Smokeless Tobacco Never   Social History   Substance and Sexual Activity  Alcohol Use No    Family History:  Family History  Problem Relation Age of Onset   Hypertension Mother    Hyperlipidemia Mother    Diabetes Mother    Cancer Mother        uterine or ovarian cancer   Glaucoma Mother    Heart disease Father    Hypertension Father    Hyperlipidemia Father    Diabetes Father    Diabetes Sister    Lung disease Sister        polyps   Cancer Brother        kidney- on dialysis   Glaucoma Brother    Hyperlipidemia Brother    Hypertension Brother    Cancer Maternal Aunt 50       ovarian   Stroke Maternal Grandfather    Diabetes Paternal Grandmother    Heart disease Paternal Grandfather    Esophageal cancer Neg Hx    Rectal cancer Neg Hx    Stomach cancer Neg Hx    Breast cancer Neg Hx    Colon polyps Neg Hx     Past medical history, surgical history, medications, allergies, family history and social history reviewed with patient today and changes made to appropriate areas of the chart.   ROS  All other ROS negative except what is listed above and in the HPI.      Objective:    BP 131/84   Pulse 79   Temp 98.2 F (36.8 C) (Oral)   Ht 5' 4.02" (1.626 m)   Wt 196 lb 11.2 oz (89.2 kg)   LMP  (LMP Unknown)   SpO2 99%   BMI 33.75 kg/m   Wt Readings from Last 3 Encounters:  02/12/23 196 lb 11.2 oz (89.2 kg)  09/12/22 195 lb (88.5 kg)  09/02/22 197 lb 11.2 oz (89.7 kg)    Physical Exam Vitals and nursing note reviewed. Exam conducted with a chaperone present.  Constitutional:      General: She is awake. She is not in acute distress.    Appearance: She is well-developed and well-groomed. She is obese. She is not ill-appearing or toxic-appearing.  HENT:     Head: Normocephalic and atraumatic.     Right Ear: Hearing, tympanic  membrane, ear canal and external ear normal. No drainage.     Left Ear: Hearing, tympanic membrane, ear canal and external ear normal. No drainage.     Nose: Nose normal.     Right Sinus: No maxillary sinus tenderness or frontal sinus tenderness.     Left Sinus: No maxillary sinus tenderness or  frontal sinus tenderness.     Mouth/Throat:     Mouth: Mucous membranes are moist.     Pharynx: Oropharynx is clear. Uvula midline. No pharyngeal swelling, oropharyngeal exudate or posterior oropharyngeal erythema.  Eyes:     General: Lids are normal.        Right eye: No discharge.        Left eye: No discharge.     Extraocular Movements: Extraocular movements intact.     Conjunctiva/sclera: Conjunctivae normal.     Pupils: Pupils are equal, round, and reactive to light.     Visual Fields: Right eye visual fields normal and left eye visual fields normal.  Neck:     Thyroid: No thyromegaly.     Vascular: Carotid bruit (R>L) present.     Trachea: Trachea normal.  Cardiovascular:     Rate and Rhythm: Normal rate and regular rhythm.     Heart sounds: Normal heart sounds. No murmur heard.    No gallop.  Pulmonary:     Effort: Pulmonary effort is normal. No accessory muscle usage or respiratory distress.     Breath sounds: Normal breath sounds.  Chest:  Breasts:    Right: Normal.     Left: Normal.  Abdominal:     General: Bowel sounds are normal.     Palpations: Abdomen is soft. There is no hepatomegaly or splenomegaly.     Tenderness: There is no abdominal tenderness.  Musculoskeletal:        General: Normal range of motion.     Cervical back: Normal range of motion and neck supple.     Right lower leg: No edema.     Left lower leg: No edema.  Lymphadenopathy:     Head:     Right side of head: No submental, submandibular, tonsillar, preauricular or posterior auricular adenopathy.     Left side of head: No submental, submandibular, tonsillar, preauricular or posterior auricular  adenopathy.     Cervical: No cervical adenopathy.     Upper Body:     Right upper body: No supraclavicular, axillary or pectoral adenopathy.     Left upper body: No supraclavicular, axillary or pectoral adenopathy.  Skin:    General: Skin is warm and dry.     Capillary Refill: Capillary refill takes less than 2 seconds.     Findings: No rash.  Neurological:     Mental Status: She is alert and oriented to person, place, and time.     Gait: Gait is intact.     Deep Tendon Reflexes: Reflexes are normal and symmetric.     Reflex Scores:      Brachioradialis reflexes are 2+ on the right side and 2+ on the left side.      Patellar reflexes are 2+ on the right side and 2+ on the left side. Psychiatric:        Attention and Perception: Attention normal.        Mood and Affect: Mood normal.        Speech: Speech normal.        Behavior: Behavior normal. Behavior is cooperative.        Thought Content: Thought content normal.        Judgment: Judgment normal.    Diabetic Foot Exam - Simple   Simple Foot Form Visual Inspection See comments: Yes Sensation Testing Intact to touch and monofilament testing bilaterally: Yes Pulse Check Posterior Tibialis and Dorsalis pulse intact bilaterally: Yes Comments Dry skin to feet  bilaterally.     Results for orders placed or performed in visit on 10/16/22  HM DIABETES EYE EXAM  Result Value Ref Range   HM Diabetic Eye Exam No Retinopathy No Retinopathy      Assessment & Plan:   Problem List Items Addressed This Visit       Cardiovascular and Mediastinum   Hypertension associated with type 2 diabetes mellitus (HCC)    Chronic, stable.  BP at goal in office today and on home readings.  Recommend she monitor BP at least a few mornings a week at home and document.  DASH diet at home.  Continue on Benazepril-HCTZ 20-12.5 MG dosing, is tolerating this well and BP remains stable.  Labs: CBC, CMP, TSH, urine ALB.  Return in 6 months.        Relevant Medications   benazepril-hydrochlorthiazide (LOTENSIN HCT) 20-12.5 MG tablet   Other Relevant Orders   Comprehensive metabolic panel   CBC with Differential/Platelet   TSH     Respiratory   OSA on CPAP    Chronic, continue to use CPAP 100% of the time.        Digestive   GERD (gastroesophageal reflux disease)   Relevant Orders   Magnesium     Endocrine   Hyperlipidemia associated with type 2 diabetes mellitus (HCC)    Chronic, stable.  Continue current medication regimen and adjust as needed.  Lipid panel today.      Relevant Medications   benazepril-hydrochlorthiazide (LOTENSIN HCT) 20-12.5 MG tablet   Other Relevant Orders   Comprehensive metabolic panel   Lipid Panel w/o Chol/HDL Ratio   Type 2 diabetes mellitus with morbid obesity ([place]) - Primary    Chronic, ongoing with A1c 5.6% last visit, plan to recheck today. Recommend continued heavy focus on diet and exercise regimen.  Continue Metformin 500 MG BID.  Check BS three mornings a week and document.  Recheck A1c and urine ALB today, adjust regimen as needed.      Relevant Medications   benazepril-hydrochlorthiazide (LOTENSIN HCT) 20-12.5 MG tablet   Other Relevant Orders   HgB A1c   Urine Microalbumin w/creat. ratio     Musculoskeletal and Integument   Osteopenia of spine    Chronic, continue daily supplements.  DEXA repeat in 2024.  Vitamin D level today.      Relevant Orders   VITAMIN D 25 Hydroxy (Vit-D Deficiency, Fractures)     Other   Bilateral carotid bruits    Ongoing and stable.  She remains asymptomatic and recent imaging by vascular noted no stenosis.  Discussed at length preventative measures, including goal for LDL <70 and BP <130/80. She is to alert provider if any symptoms present.  Will plan on returning to vascular if symptoms present or worsening bruits noted.  At this time continue preventative measures, including ASA and statin.  Plan for repeat imaging in upcoming year.       Depression, major, single episode, severe (HCC)    Chronic, ongoing.  Continue current medication regimen, Wellbutrin 300 MG daily.  Denies SI/HI -- is aware to notify provider immediately if SI presents or plan.  No medication adjustment at this time.        Morbid obesity (HCC)    BMI 33.75, maintaining loss.  Recommended eating smaller high protein, low fat meals more frequently and exercising 30 mins a day 5 times a week with a goal of 10-15lb weight loss in the next 3 months. Patient voiced their understanding and  motivation to adhere to these recommendations.       Primary insomnia    Chronic, ongoing.  Did not tolerate Melatonin.  Recommend trial of Magnesium 400 MG every night or as needed for rest.  Continue CPAP use.      Other Visit Diagnoses     Encounter for screening mammogram for malignant neoplasm of breast       Mammogram ordered.   Relevant Orders   MM 3D SCREEN BREAST BILATERAL   Encounter for annual physical exam       Annual physical today with labs and health maintenance reviewed, discussed with patient.        Follow up plan: Return in about 6 months (around 08/13/2023) for T2DM, HTN/HLD, MOOD.   LABORATORY TESTING:  - Pap smear: not applicable  IMMUNIZATIONS:   - Tdap: Tetanus vaccination status reviewed: last tetanus booster within 10 years. - Influenza: Up to date - Pneumovax: Up to date - Prevnar: Up to date - COVID: Up to date - HPV: Not applicable - Shingrix vaccine: Up to date  SCREENING: -Mammogram: Up to date  - Colonoscopy: Up To Date - Bone Density:  Up To Date == osteopenia, due next 07/23/2023   -Hearing Test:  no applicable   -Spirometry: Not applicable   PATIENT COUNSELING:    Diet: Encouraged to adjust caloric intake to maintain or achieve ideal body weight, to reduce intake of dietary saturated fat and total fat, to limit sodium intake by avoiding high sodium foods and not adding table salt, and to maintain adequate dietary  potassium and calcium preferably from fresh fruits, vegetables, and low-fat dairy products.    Stressed the importance of regular exercise  Injury prevention: Discussed safety belts, safety helmets, smoke detector, smoking near bedding or upholstery.   Dental health: Discussed importance of regular tooth brushing, flossing, and dental visits.    NEXT PREVENTATIVE PHYSICAL DUE IN 1 YEAR. Return in about 6 months (around 08/13/2023) for T2DM, HTN/HLD, MOOD.

## 2023-02-12 NOTE — Assessment & Plan Note (Signed)
Chronic, ongoing with A1c 5.6% last visit, plan to recheck today. Recommend continued heavy focus on diet and exercise regimen.  Continue Metformin 500 MG BID.  Check BS three mornings a week and document.  Recheck A1c and urine ALB today, adjust regimen as needed.

## 2023-02-12 NOTE — Assessment & Plan Note (Signed)
Chronic, stable.  Continue current medication regimen and adjust as needed.  Lipid panel today.

## 2023-02-12 NOTE — Assessment & Plan Note (Signed)
Chronic, continue daily supplements.  DEXA repeat in 2024.  Vitamin D level today.

## 2023-02-12 NOTE — Assessment & Plan Note (Signed)
Chronic, ongoing.  Did not tolerate Melatonin.  Recommend trial of Magnesium 400 MG every night or as needed for rest.  Continue CPAP use.

## 2023-02-12 NOTE — Assessment & Plan Note (Signed)
Chronic, continue to use CPAP 100% of the time.

## 2023-02-12 NOTE — Assessment & Plan Note (Signed)
Ongoing and stable.  She remains asymptomatic and recent imaging by vascular noted no stenosis.  Discussed at length preventative measures, including goal for LDL <70 and BP <130/80. She is to alert provider if any symptoms present.  Will plan on returning to vascular if symptoms present or worsening bruits noted.  At this time continue preventative measures, including ASA and statin.  Plan for repeat imaging in upcoming year.

## 2023-02-13 DIAGNOSIS — J3089 Other allergic rhinitis: Secondary | ICD-10-CM | POA: Diagnosis not present

## 2023-02-13 LAB — CBC WITH DIFFERENTIAL/PLATELET
Basophils Absolute: 0 10*3/uL (ref 0.0–0.2)
Basos: 0 %
EOS (ABSOLUTE): 0.1 10*3/uL (ref 0.0–0.4)
Eos: 1 %
Hematocrit: 42.8 % (ref 34.0–46.6)
Hemoglobin: 14.6 g/dL (ref 11.1–15.9)
Immature Grans (Abs): 0 10*3/uL (ref 0.0–0.1)
Immature Granulocytes: 0 %
Lymphocytes Absolute: 2.9 10*3/uL (ref 0.7–3.1)
Lymphs: 31 %
MCH: 30.9 pg (ref 26.6–33.0)
MCHC: 34.1 g/dL (ref 31.5–35.7)
MCV: 91 fL (ref 79–97)
Monocytes Absolute: 0.5 10*3/uL (ref 0.1–0.9)
Monocytes: 5 %
Neutrophils Absolute: 5.8 10*3/uL (ref 1.4–7.0)
Neutrophils: 63 %
Platelets: 269 10*3/uL (ref 150–450)
RBC: 4.72 x10E6/uL (ref 3.77–5.28)
RDW: 12 % (ref 11.7–15.4)
WBC: 9.2 10*3/uL (ref 3.4–10.8)

## 2023-02-13 LAB — COMPREHENSIVE METABOLIC PANEL
ALT: 27 IU/L (ref 0–32)
AST: 22 IU/L (ref 0–40)
Albumin/Globulin Ratio: 2.1 (ref 1.2–2.2)
Albumin: 3.9 g/dL (ref 3.9–4.9)
Alkaline Phosphatase: 84 IU/L (ref 44–121)
BUN/Creatinine Ratio: 17 (ref 12–28)
BUN: 13 mg/dL (ref 8–27)
Bilirubin Total: 0.5 mg/dL (ref 0.0–1.2)
CO2: 26 mmol/L (ref 20–29)
Calcium: 10.1 mg/dL (ref 8.7–10.3)
Chloride: 102 mmol/L (ref 96–106)
Creatinine, Ser: 0.77 mg/dL (ref 0.57–1.00)
Globulin, Total: 1.9 g/dL (ref 1.5–4.5)
Glucose: 103 mg/dL — ABNORMAL HIGH (ref 70–99)
Potassium: 3.9 mmol/L (ref 3.5–5.2)
Sodium: 141 mmol/L (ref 134–144)
Total Protein: 5.8 g/dL — ABNORMAL LOW (ref 6.0–8.5)
eGFR: 83 mL/min/{1.73_m2} (ref >59)

## 2023-02-13 LAB — HEMOGLOBIN A1C
Est. average glucose Bld gHb Est-mCnc: 120 mg/dL
Hgb A1c MFr Bld: 5.8 % — ABNORMAL HIGH (ref 4.8–5.6)

## 2023-02-13 LAB — LIPID PANEL W/O CHOL/HDL RATIO
Cholesterol, Total: 106 mg/dL (ref 100–199)
HDL: 44 mg/dL (ref >39)
LDL Chol Calc (NIH): 43 mg/dL (ref 0–99)
Triglycerides: 98 mg/dL (ref 0–149)
VLDL Cholesterol Cal: 19 mg/dL (ref 5–40)

## 2023-02-13 LAB — MAGNESIUM: Magnesium: 2 mg/dL (ref 1.6–2.3)

## 2023-02-13 LAB — VITAMIN D 25 HYDROXY (VIT D DEFICIENCY, FRACTURES): Vit D, 25-Hydroxy: 55.9 ng/mL (ref 30.0–100.0)

## 2023-02-13 LAB — TSH: TSH: 2.67 u[IU]/mL (ref 0.450–4.500)

## 2023-02-13 NOTE — Progress Notes (Signed)
Contacted via [place] morning [facility], your labs have returned.  Overall these look amazing!!  A1c remains stable at 5.8%, previous was 5.6%.  I would continue all current medications as you are really making a difference in your overall health.  Great job!!  Keep it up!! Any questions? Keep being amazing!!  Thank you for allowing me to participate in your care.  I appreciate you. Kindest regards, Eri Mcevers

## 2023-02-14 LAB — MICROALBUMIN / CREATININE URINE RATIO
Creatinine, Urine: 46 mg/dL
Microalb/Creat Ratio: 15 mg/g creat (ref 0–29)
Microalbumin, Urine: 6.9 ug/mL

## 2023-02-20 DIAGNOSIS — J3089 Other allergic rhinitis: Secondary | ICD-10-CM | POA: Diagnosis not present

## 2023-02-20 DIAGNOSIS — J3081 Allergic rhinitis due to animal (cat) (dog) hair and dander: Secondary | ICD-10-CM | POA: Diagnosis not present

## 2023-02-20 DIAGNOSIS — J301 Allergic rhinitis due to pollen: Secondary | ICD-10-CM | POA: Diagnosis not present

## 2023-02-27 DIAGNOSIS — J3089 Other allergic rhinitis: Secondary | ICD-10-CM | POA: Diagnosis not present

## 2023-03-06 DIAGNOSIS — J3081 Allergic rhinitis due to animal (cat) (dog) hair and dander: Secondary | ICD-10-CM | POA: Diagnosis not present

## 2023-03-06 DIAGNOSIS — J3089 Other allergic rhinitis: Secondary | ICD-10-CM | POA: Diagnosis not present

## 2023-03-06 DIAGNOSIS — J301 Allergic rhinitis due to pollen: Secondary | ICD-10-CM | POA: Diagnosis not present

## 2023-03-13 DIAGNOSIS — J301 Allergic rhinitis due to pollen: Secondary | ICD-10-CM | POA: Diagnosis not present

## 2023-03-13 DIAGNOSIS — J3081 Allergic rhinitis due to animal (cat) (dog) hair and dander: Secondary | ICD-10-CM | POA: Diagnosis not present

## 2023-03-13 DIAGNOSIS — J3089 Other allergic rhinitis: Secondary | ICD-10-CM | POA: Diagnosis not present

## 2023-03-26 DIAGNOSIS — J3089 Other allergic rhinitis: Secondary | ICD-10-CM | POA: Diagnosis not present

## 2023-04-03 DIAGNOSIS — J3081 Allergic rhinitis due to animal (cat) (dog) hair and dander: Secondary | ICD-10-CM | POA: Diagnosis not present

## 2023-04-03 DIAGNOSIS — J3089 Other allergic rhinitis: Secondary | ICD-10-CM | POA: Diagnosis not present

## 2023-04-03 DIAGNOSIS — J301 Allergic rhinitis due to pollen: Secondary | ICD-10-CM | POA: Diagnosis not present

## 2023-04-10 DIAGNOSIS — J3089 Other allergic rhinitis: Secondary | ICD-10-CM | POA: Diagnosis not present

## 2023-04-10 DIAGNOSIS — J301 Allergic rhinitis due to pollen: Secondary | ICD-10-CM | POA: Diagnosis not present

## 2023-04-10 DIAGNOSIS — J3081 Allergic rhinitis due to animal (cat) (dog) hair and dander: Secondary | ICD-10-CM | POA: Diagnosis not present

## 2023-04-18 DIAGNOSIS — J3089 Other allergic rhinitis: Secondary | ICD-10-CM | POA: Diagnosis not present

## 2023-04-18 DIAGNOSIS — J301 Allergic rhinitis due to pollen: Secondary | ICD-10-CM | POA: Diagnosis not present

## 2023-04-18 DIAGNOSIS — J3081 Allergic rhinitis due to animal (cat) (dog) hair and dander: Secondary | ICD-10-CM | POA: Diagnosis not present

## 2023-04-25 DIAGNOSIS — J301 Allergic rhinitis due to pollen: Secondary | ICD-10-CM | POA: Diagnosis not present

## 2023-04-25 DIAGNOSIS — J3089 Other allergic rhinitis: Secondary | ICD-10-CM | POA: Diagnosis not present

## 2023-04-25 DIAGNOSIS — J3081 Allergic rhinitis due to animal (cat) (dog) hair and dander: Secondary | ICD-10-CM | POA: Diagnosis not present

## 2023-05-06 ENCOUNTER — Ambulatory Visit
Admission: RE | Admit: 2023-05-06 | Discharge: 2023-05-06 | Disposition: A | Discharge status: Home / Self Care | Payer: Medicare PPO | Source: Ambulatory Visit | Attending: Nurse Practitioner | Admitting: Nurse Practitioner

## 2023-05-06 DIAGNOSIS — Z1231 Encounter for screening mammogram for malignant neoplasm of breast: Secondary | ICD-10-CM | POA: Diagnosis not present

## 2023-05-07 ENCOUNTER — Other Ambulatory Visit: Payer: Self-pay | Admitting: Nurse Practitioner

## 2023-05-07 DIAGNOSIS — N6489 Other specified disorders of breast: Secondary | ICD-10-CM

## 2023-05-07 DIAGNOSIS — R928 Other abnormal and inconclusive findings on diagnostic imaging of breast: Secondary | ICD-10-CM

## 2023-05-07 NOTE — Progress Notes (Signed)
Contacted via MyChart   Good afternoon [place], your mammogram did return showing some asymmetry to right breast and you are in need of further imaging -- please call [name] to get this scheduled:)

## 2023-05-08 DIAGNOSIS — J3089 Other allergic rhinitis: Secondary | ICD-10-CM | POA: Diagnosis not present

## 2023-05-08 DIAGNOSIS — G4733 Obstructive sleep apnea (adult) (pediatric): Secondary | ICD-10-CM | POA: Diagnosis not present

## 2023-05-12 ENCOUNTER — Ambulatory Visit
Admission: RE | Admit: 2023-05-12 | Discharge: 2023-05-12 | Disposition: A | Discharge status: Home / Self Care | Payer: Medicare PPO | Source: Ambulatory Visit | Attending: Nurse Practitioner | Admitting: Nurse Practitioner

## 2023-05-12 DIAGNOSIS — R928 Other abnormal and inconclusive findings on diagnostic imaging of breast: Secondary | ICD-10-CM

## 2023-05-12 DIAGNOSIS — N6489 Other specified disorders of breast: Secondary | ICD-10-CM

## 2023-05-12 DIAGNOSIS — R92321 Mammographic fibroglandular density, right breast: Secondary | ICD-10-CM | POA: Diagnosis not present

## 2023-05-12 NOTE — Progress Notes (Signed)
Contacted via MyChart   Normal mammogram, may repeat in one year:)

## 2023-05-16 DIAGNOSIS — J3081 Allergic rhinitis due to animal (cat) (dog) hair and dander: Secondary | ICD-10-CM | POA: Diagnosis not present

## 2023-05-16 DIAGNOSIS — J301 Allergic rhinitis due to pollen: Secondary | ICD-10-CM | POA: Diagnosis not present

## 2023-05-16 DIAGNOSIS — J3089 Other allergic rhinitis: Secondary | ICD-10-CM | POA: Diagnosis not present

## 2023-05-22 DIAGNOSIS — J3089 Other allergic rhinitis: Secondary | ICD-10-CM | POA: Diagnosis not present

## 2023-05-22 DIAGNOSIS — J301 Allergic rhinitis due to pollen: Secondary | ICD-10-CM | POA: Diagnosis not present

## 2023-05-22 DIAGNOSIS — J3081 Allergic rhinitis due to animal (cat) (dog) hair and dander: Secondary | ICD-10-CM | POA: Diagnosis not present

## 2023-05-29 DIAGNOSIS — J301 Allergic rhinitis due to pollen: Secondary | ICD-10-CM | POA: Diagnosis not present

## 2023-05-29 DIAGNOSIS — J3081 Allergic rhinitis due to animal (cat) (dog) hair and dander: Secondary | ICD-10-CM | POA: Diagnosis not present

## 2023-05-29 DIAGNOSIS — J3089 Other allergic rhinitis: Secondary | ICD-10-CM | POA: Diagnosis not present

## 2023-06-05 DIAGNOSIS — J3089 Other allergic rhinitis: Secondary | ICD-10-CM | POA: Diagnosis not present

## 2023-06-05 DIAGNOSIS — J301 Allergic rhinitis due to pollen: Secondary | ICD-10-CM | POA: Diagnosis not present

## 2023-06-05 DIAGNOSIS — J3081 Allergic rhinitis due to animal (cat) (dog) hair and dander: Secondary | ICD-10-CM | POA: Diagnosis not present

## 2023-06-12 DIAGNOSIS — J3081 Allergic rhinitis due to animal (cat) (dog) hair and dander: Secondary | ICD-10-CM | POA: Diagnosis not present

## 2023-06-12 DIAGNOSIS — J3089 Other allergic rhinitis: Secondary | ICD-10-CM | POA: Diagnosis not present

## 2023-06-12 DIAGNOSIS — J301 Allergic rhinitis due to pollen: Secondary | ICD-10-CM | POA: Diagnosis not present

## 2023-06-19 DIAGNOSIS — J3089 Other allergic rhinitis: Secondary | ICD-10-CM | POA: Diagnosis not present

## 2023-06-19 DIAGNOSIS — J3081 Allergic rhinitis due to animal (cat) (dog) hair and dander: Secondary | ICD-10-CM | POA: Diagnosis not present

## 2023-06-19 DIAGNOSIS — J301 Allergic rhinitis due to pollen: Secondary | ICD-10-CM | POA: Diagnosis not present

## 2023-07-03 DIAGNOSIS — J301 Allergic rhinitis due to pollen: Secondary | ICD-10-CM | POA: Diagnosis not present

## 2023-07-03 DIAGNOSIS — J3081 Allergic rhinitis due to animal (cat) (dog) hair and dander: Secondary | ICD-10-CM | POA: Diagnosis not present

## 2023-07-03 DIAGNOSIS — J3089 Other allergic rhinitis: Secondary | ICD-10-CM | POA: Diagnosis not present

## 2023-07-10 DIAGNOSIS — J301 Allergic rhinitis due to pollen: Secondary | ICD-10-CM | POA: Diagnosis not present

## 2023-07-10 DIAGNOSIS — J3089 Other allergic rhinitis: Secondary | ICD-10-CM | POA: Diagnosis not present

## 2023-07-10 DIAGNOSIS — J3081 Allergic rhinitis due to animal (cat) (dog) hair and dander: Secondary | ICD-10-CM | POA: Diagnosis not present

## 2023-07-15 ENCOUNTER — Other Ambulatory Visit: Payer: Self-pay | Admitting: Nurse Practitioner

## 2023-07-16 ENCOUNTER — Other Ambulatory Visit: Payer: Self-pay | Admitting: Nurse Practitioner

## 2023-07-16 ENCOUNTER — Encounter: Payer: Self-pay | Admitting: Nurse Practitioner

## 2023-07-16 MED ORDER — METFORMIN HCL 500 MG PO TABS: 500.0000 mg | ORAL_TABLET | Freq: Two times a day (BID) | ORAL | 4 refills | Status: DC

## 2023-07-16 NOTE — Telephone Encounter (Signed)
Requested medication (s) are due for refill today: expired medication  Requested medication (s) are on the active medication list: yes   Last refill:  04/22/22 #180 4 refills   Future visit scheduled: yes in 4 weeks   Notes to clinic:  expired medication. Do you want to renew Rx?     Requested Prescriptions  Pending Prescriptions Disp Refills   metFORMIN (GLUCOPHAGE) 500 MG tablet [Pharmacy Med Name: METFORMIN 500MG  TABLETS] 180 tablet 4    Sig: TAKE 1 TABLET(500 MG) BY MOUTH TWICE DAILY WITH A MEAL     Endocrinology:  Diabetes - Biguanides Failed - 07/15/2023  9:59 AM      Failed - B12 Level in normal range and within 720 days    Vitamin B-12  Date Value Ref Range Status  11/08/2021 1,725 (H) 232 - 1,245 pg/mL Final         Passed - Cr in normal range and within 360 days    Creatinine, Ser  Date Value Ref Range Status  02/12/2023 0.77 0.57 - 1.00 mg/dL Final         Passed - HBA1C is between 0 and 7.9 and within 180 days    HB A1C (BAYER DCA - WAIVED)  Date Value Ref Range Status  08/05/2022 5.6 4.8 - 5.6 % Final    Comment:             Prediabetes: 5.7 - 6.4          Diabetes: >6.4          Glycemic control for adults with diabetes: <7.0    Hgb A1c MFr Bld  Date Value Ref Range Status  02/12/2023 5.8 (H) 4.8 - 5.6 % Final    Comment:             Prediabetes: 5.7 - 6.4          Diabetes: >6.4          Glycemic control for adults with diabetes: <7.0          Passed - eGFR in normal range and within 360 days    GFR calc Af Amer  Date Value Ref Range Status  01/25/2021 77 >59 mL/min/1.73 Final    Comment:    **In accordance with recommendations from the NKF-ASN Task force,**   [facility] is in the process of updating its eGFR calculation to the   2021 CKD-EPI creatinine equation that estimates kidney function   without a race variable.    GFR calc non Af Amer  Date Value Ref Range Status  01/25/2021 67 >59 mL/min/1.73 Final   eGFR  Date Value Ref Range  Status  02/12/2023 83 >59 mL/min/1.73 Final         Passed - Valid encounter within last 6 months    Recent Outpatient Visits           5 months ago Type 2 diabetes mellitus with morbid obesity (HCC)   [place] [facility] [place], [place] T, NP   10 months ago Hypertension associated with type 2 diabetes mellitus (HCC)   [place] [facility] [place], [place] T, NP   11 months ago Type 2 diabetes mellitus with morbid obesity (HCC)   [place] [facility] [place], [place] T, NP   1 year ago Type 2 diabetes mellitus with morbid obesity (HCC)   [place] [facility] [place], [place] T, NP   1 year ago Type 2 diabetes mellitus with morbid obesity (  HCC)   [place] [facility] [place], [name] T, NP       Future Appointments             In 4 weeks [place], Dorie Rank, NP [place] [facility]            Passed - CBC within normal limits and completed in the last 12 months    WBC  Date Value Ref Range Status  02/12/2023 9.2 3.4 - 10.8 x10E3/uL Final   RBC  Date Value Ref Range Status  02/12/2023 4.72 3.77 - 5.28 x10E6/uL Final   Hemoglobin  Date Value Ref Range Status  02/12/2023 14.6 11.1 - 15.9 g/dL Final   Hematocrit  Date Value Ref Range Status  02/12/2023 42.8 34.0 - 46.6 % Final   MCHC  Date Value Ref Range Status  02/12/2023 34.1 31.5 - 35.7 g/dL Final   [facility]  Date Value Ref Range Status  02/12/2023 30.9 26.6 - 33.0 pg Final   MCV  Date Value Ref Range Status  02/12/2023 91 79 - 97 fL Final   No results found for: "PLTCOUNTKUC", "LABPLAT", "POCPLA" RDW  Date Value Ref Range Status  02/12/2023 12.0 11.7 - 15.4 % Final

## 2023-07-17 DIAGNOSIS — J301 Allergic rhinitis due to pollen: Secondary | ICD-10-CM | POA: Diagnosis not present

## 2023-07-17 DIAGNOSIS — J3089 Other allergic rhinitis: Secondary | ICD-10-CM | POA: Diagnosis not present

## 2023-07-17 DIAGNOSIS — J3081 Allergic rhinitis due to animal (cat) (dog) hair and dander: Secondary | ICD-10-CM | POA: Diagnosis not present

## 2023-07-17 NOTE — Telephone Encounter (Signed)
Requested Prescriptions  Refused Prescriptions Disp Refills   metFORMIN (GLUCOPHAGE) 500 MG tablet [Pharmacy Med Name: METFORMIN 500MG  TABLETS] 180 tablet 4    Sig: TAKE 1 TABLET(500 MG) BY MOUTH TWICE DAILY WITH A MEAL     Endocrinology:  Diabetes - Biguanides Failed - 07/16/2023  1:36 PM      Failed - B12 Level in normal range and within 720 days    Vitamin B-12  Date Value Ref Range Status  11/08/2021 1,725 (H) 232 - 1,245 pg/mL Final         Passed - Cr in normal range and within 360 days    Creatinine, Ser  Date Value Ref Range Status  02/12/2023 0.77 0.57 - 1.00 mg/dL Final         Passed - HBA1C is between 0 and 7.9 and within 180 days    HB A1C (BAYER DCA - WAIVED)  Date Value Ref Range Status  08/05/2022 5.6 4.8 - 5.6 % Final    Comment:             Prediabetes: 5.7 - 6.4          Diabetes: >6.4          Glycemic control for adults with diabetes: <7.0    Hgb A1c MFr Bld  Date Value Ref Range Status  02/12/2023 5.8 (H) 4.8 - 5.6 % Final    Comment:             Prediabetes: 5.7 - 6.4          Diabetes: >6.4          Glycemic control for adults with diabetes: <7.0          Passed - eGFR in normal range and within 360 days    GFR calc Af Amer  Date Value Ref Range Status  01/25/2021 77 >59 mL/min/1.73 Final    Comment:    **In accordance with recommendations from the NKF-ASN Task force,**   [facility] is in the process of updating its eGFR calculation to the   2021 CKD-EPI creatinine equation that estimates kidney function   without a race variable.    GFR calc non Af Amer  Date Value Ref Range Status  01/25/2021 67 >59 mL/min/1.73 Final   eGFR  Date Value Ref Range Status  02/12/2023 83 >59 mL/min/1.73 Final         Passed - Valid encounter within last 6 months    Recent Outpatient Visits           5 months ago Type 2 diabetes mellitus with morbid obesity (HCC)   [place] [facility] [place], [place] T, NP   10 months ago Hypertension  associated with type 2 diabetes mellitus (HCC)   [place] [facility] [place], [place] T, NP   11 months ago Type 2 diabetes mellitus with morbid obesity (HCC)   [place] [facility] [place], [place] T, NP   1 year ago Type 2 diabetes mellitus with morbid obesity (HCC)   [place] [facility] [place], [name] T, NP   1 year ago Type 2 diabetes mellitus with morbid obesity (HCC)   [place] [facility] [place], Dorie Rank, NP       Future Appointments             In 3 weeks [place], Dorie Rank, NP [place] [facility]            Passed -  CBC within normal limits and completed in the last 12 months    WBC  Date Value Ref Range Status  02/12/2023 9.2 3.4 - 10.8 x10E3/uL Final   RBC  Date Value Ref Range Status  02/12/2023 4.72 3.77 - 5.28 x10E6/uL Final   Hemoglobin  Date Value Ref Range Status  02/12/2023 14.6 11.1 - 15.9 g/dL Final   Hematocrit  Date Value Ref Range Status  02/12/2023 42.8 34.0 - 46.6 % Final   MCHC  Date Value Ref Range Status  02/12/2023 34.1 31.5 - 35.7 g/dL Final   [facility]  Date Value Ref Range Status  02/12/2023 30.9 26.6 - 33.0 pg Final   MCV  Date Value Ref Range Status  02/12/2023 91 79 - 97 fL Final   No results found for: "PLTCOUNTKUC", "LABPLAT", "POCPLA" RDW  Date Value Ref Range Status  02/12/2023 12.0 11.7 - 15.4 % Final

## 2023-07-24 DIAGNOSIS — J3089 Other allergic rhinitis: Secondary | ICD-10-CM | POA: Diagnosis not present

## 2023-07-24 DIAGNOSIS — J3081 Allergic rhinitis due to animal (cat) (dog) hair and dander: Secondary | ICD-10-CM | POA: Diagnosis not present

## 2023-07-24 DIAGNOSIS — J301 Allergic rhinitis due to pollen: Secondary | ICD-10-CM | POA: Diagnosis not present

## 2023-08-01 DIAGNOSIS — J301 Allergic rhinitis due to pollen: Secondary | ICD-10-CM | POA: Diagnosis not present

## 2023-08-01 DIAGNOSIS — J3089 Other allergic rhinitis: Secondary | ICD-10-CM | POA: Diagnosis not present

## 2023-08-01 DIAGNOSIS — J3081 Allergic rhinitis due to animal (cat) (dog) hair and dander: Secondary | ICD-10-CM | POA: Diagnosis not present

## 2023-08-07 DIAGNOSIS — J3089 Other allergic rhinitis: Secondary | ICD-10-CM | POA: Diagnosis not present

## 2023-08-07 DIAGNOSIS — J3081 Allergic rhinitis due to animal (cat) (dog) hair and dander: Secondary | ICD-10-CM | POA: Diagnosis not present

## 2023-08-07 DIAGNOSIS — J301 Allergic rhinitis due to pollen: Secondary | ICD-10-CM | POA: Diagnosis not present

## 2023-08-08 DIAGNOSIS — G4733 Obstructive sleep apnea (adult) (pediatric): Secondary | ICD-10-CM | POA: Diagnosis not present

## 2023-08-10 NOTE — Patient Instructions (Signed)
 Be Involved in Caring For Your Health:  Taking Medications When medications are taken as directed, they can greatly improve your health. But if they are not taken as prescribed, they may not work. In some cases, not taking them correctly can be harmful. To help ensure your treatment remains effective and safe, understand your medications and how to take them. Bring your medications to each visit for review by your provider.  Your lab results, notes, and after visit summary will be available on My Chart. We strongly encourage you to use this feature. If lab results are abnormal the clinic will contact you with the appropriate steps. If the clinic does not contact you assume the results are satisfactory. You can always view your results on My Chart. If you have questions regarding your health or results, please contact the clinic during office hours. You can also ask questions on My Chart.  We at [facility] are grateful that you chose [place] to provide your care. We strive to provide evidence-based and compassionate care and are always looking for feedback. If you get a survey from the clinic please complete this so we can hear your opinions.  Diabetes Mellitus and Nutrition, Adult When you have diabetes, or diabetes mellitus, it is very important to have healthy eating habits because your blood sugar (glucose) levels are greatly affected by what you eat and drink. Eating healthy foods in the right amounts, at about the same times every day, can help you: Manage your blood glucose. Lower your risk of heart disease. Improve your blood pressure. Reach or maintain a healthy weight. What can affect my meal plan? Every person with diabetes is different, and each person has different needs for a meal plan. Your health care provider may recommend that you work with a dietitian to make a meal plan that is best for you. Your meal plan may vary depending on factors such as: The calories you need. The  medicines you take. Your weight. Your blood glucose, blood pressure, and cholesterol levels. Your activity level. Other health conditions you have, such as heart or kidney disease. How do carbohydrates affect me? Carbohydrates, also called carbs, affect your blood glucose level more than any other type of food. Eating carbs raises the amount of glucose in your blood. It is important to know how many carbs you can safely have in each meal. This is different for every person. Your dietitian can help you calculate how many carbs you should have at each meal and for each snack. How does alcohol affect me? Alcohol can cause a decrease in blood glucose (hypoglycemia), especially if you use insulin or take certain diabetes medicines by mouth. Hypoglycemia can be a life-threatening condition. Symptoms of hypoglycemia, such as sleepiness, dizziness, and confusion, are similar to symptoms of having too much alcohol. Do not drink alcohol if: Your health care provider tells you not to drink. You are pregnant, may be pregnant, or are planning to become pregnant. If you drink alcohol: Limit how much you have to: 0-1 drink a day for women. 0-2 drinks a day for men. Know how much alcohol is in your drink. In the [place] one drink equals one 12 oz bottle of beer (355 mL), one 5 oz glass of wine (148 mL), or one 1 oz glass of hard liquor (44 mL). Keep yourself hydrated with water, diet soda, or unsweetened iced tea. Keep in mind that regular soda, juice, and other mixers may contain a lot of sugar and must  be counted as carbs. What are tips for following this plan?  Reading food labels Start by checking the serving size on the Nutrition Facts label of packaged foods and drinks. The number of calories and the amount of carbs, fats, and other nutrients listed on the label are based on one serving of the item. Many items contain more than one serving per package. Check the total grams (g) of carbs in one  serving. Check the number of grams of saturated fats and trans fats in one serving. Choose foods that have a low amount or none of these fats. Check the number of milligrams (mg) of salt (sodium) in one serving. Most people should limit total sodium intake to less than 2,300 mg per day. Always check the nutrition information of foods labeled as "low-fat" or "nonfat." These foods may be higher in added sugar or refined carbs and should be avoided. Talk to your dietitian to identify your daily goals for nutrients listed on the label. Shopping Avoid buying canned, pre-made, or processed foods. These foods tend to be high in fat, sodium, and added sugar. Shop around the outside edge of the grocery store. This is where you will most often find fresh fruits and vegetables, bulk grains, fresh meats, and fresh dairy products. Cooking Use low-heat cooking methods, such as baking, instead of high-heat cooking methods, such as deep frying. Cook using healthy oils, such as olive, canola, or sunflower oil. Avoid cooking with butter, cream, or high-fat meats. Meal planning Eat meals and snacks regularly, preferably at the same times every day. Avoid going long periods of time without eating. Eat foods that are high in fiber, such as fresh fruits, vegetables, beans, and whole grains. Eat 4-6 oz (112-168 g) of lean protein each day, such as lean meat, chicken, fish, eggs, or tofu. One ounce (oz) (28 g) of lean protein is equal to: 1 oz (28 g) of meat, chicken, or fish. 1 egg.  cup (62 g) of tofu. Eat some foods each day that contain healthy fats, such as avocado, nuts, seeds, and fish. What foods should I eat? Fruits Berries. Apples. Oranges. Peaches. Apricots. Plums. Grapes. Mangoes. Papayas. Pomegranates. Kiwi. Cherries. Vegetables Leafy greens, including lettuce, spinach, kale, chard, collard greens, mustard greens, and cabbage. Beets. Cauliflower. Broccoli. Carrots. Green beans. Tomatoes. Peppers.  Onions. Cucumbers. Brussels sprouts. Grains Whole grains, such as whole-wheat or whole-grain bread, crackers, tortillas, cereal, and pasta. Unsweetened oatmeal. Quinoa. Brown or wild rice. Meats and other proteins Seafood. Poultry without skin. Lean cuts of poultry and beef. Tofu. Nuts. Seeds. Dairy Low-fat or fat-free dairy products such as milk, yogurt, and cheese. The items listed above may not be a complete list of foods and beverages you can eat and drink. Contact a dietitian for more information. What foods should I avoid? Fruits Fruits canned with syrup. Vegetables Canned vegetables. Frozen vegetables with butter or cream sauce. Grains Refined white flour and flour products such as bread, pasta, snack foods, and cereals. Avoid all processed foods. Meats and other proteins Fatty cuts of meat. Poultry with skin. Breaded or fried meats. Processed meat. Avoid saturated fats. Dairy Full-fat yogurt, cheese, or milk. Beverages Sweetened drinks, such as soda or iced tea. The items listed above may not be a complete list of foods and beverages you should avoid. Contact a dietitian for more information. Questions to ask a health care provider Do I need to meet with a certified diabetes care and education specialist? Do I need to meet with a  dietitian? What number can I call if I have questions? When are the best times to check my blood glucose? Where to find more information: American Diabetes Association: diabetes.org Academy of Nutrition and Dietetics: eatright[employer] of Diabetes and Digestive and Kidney Diseases: StageSync.si Association of Diabetes Care & Education Specialists: diabeteseducator.org Summary It is important to have healthy eating habits because your blood sugar (glucose) levels are greatly affected by what you eat and drink. It is important to use alcohol carefully. A healthy meal plan will help you manage your blood glucose and lower your risk of  heart disease. Your health care provider may recommend that you work with a dietitian to make a meal plan that is best for you. This information is not intended to replace advice given to you by your health care provider. Make sure you discuss any questions you have with your health care provider. Document Revised: 07/12/2020 Document Reviewed: 07/12/2020 Elsevier Patient Education  2024 [employer].

## 2023-08-11 ENCOUNTER — Ambulatory Visit (INDEPENDENT_AMBULATORY_CARE_PROVIDER_SITE_OTHER): Payer: Medicare PPO | Admitting: Internal Medicine

## 2023-08-11 VITALS — BP 141/89 | HR 85 | Resp 12 | Ht 63.0 in | Wt 202.0 lb

## 2023-08-11 DIAGNOSIS — I152 Hypertension secondary to endocrine disorders: Secondary | ICD-10-CM | POA: Diagnosis not present

## 2023-08-11 DIAGNOSIS — G4733 Obstructive sleep apnea (adult) (pediatric): Secondary | ICD-10-CM | POA: Diagnosis not present

## 2023-08-11 DIAGNOSIS — E1159 Type 2 diabetes mellitus with other circulatory complications: Secondary | ICD-10-CM

## 2023-08-11 DIAGNOSIS — Z7189 Other specified counseling: Secondary | ICD-10-CM | POA: Diagnosis not present

## 2023-08-11 NOTE — Progress Notes (Signed)
[facility] [address] [place], [place] [address]  Pulmonary Sleep Medicine   Office Visit Note  Patient Name: INASIA BRAULT DOB: 17-Nov-1951 MRN [id number]    Chief Complaint: Obstructive Sleep Apnea visit  Brief History:  Pegeen is seen today for an annual follow up on CPAP at 7 cmh20.  The patient has a 7 year history of sleep apnea. Patient is using PAP nightly.  The patient feels sometimes rested after sleeping with PAP.  The patient reports benefiting from PAP use. Reported sleepiness is  improved and the Epworth Sleepiness Score is 0 out of 24. The patient does not take naps. The patient complains of the following: Some mask leak and insomnia.  The compliance download shows 96% compliance with an average use time of 8:26 hours. The AHI is 0.7.  The patient doe of limb movements disrupting sleep.  ROS  General: (-) fever, (-) chills, (-) night sweat Nose and Sinuses: (-) nasal stuffiness or itchiness, (-) postnasal drip, (-) nosebleeds, (-) sinus trouble. Mouth and Throat: (-) sore throat, (-) hoarseness. Neck: (-) swollen glands, (-) enlarged thyroid, (-) neck pain. Respiratory: - cough, - shortness of breath, - wheezing. Neurologic: - numbness, - tingling. Psychiatric: + anxiety, + depression   Current Medication: Outpatient Encounter Medications as of 08/11/2023  Medication Sig   ACCU-CHEK GUIDE test strip TEST THREE TIMES DAILY   Accu-Chek Softclix Lancets lancets USE TO CHECK BLOOD SUGAR THREE TIMES DAILY AND DOCUMENT FOR VISITS. BRING LOG TO VISITS   acyclovir ointment (ZOVIRAX) 5 % Apply 1 application topically 3 (three) times daily as needed.   aspirin 81 MG tablet Take 81 mg by mouth daily.   atorvastatin (LIPITOR) 80 MG tablet TAKE 1 TABLET(80 MG) BY MOUTH DAILY   benazepril-hydrochlorthiazide (LOTENSIN HCT) 20-12.5 MG tablet Take 1 tablet by mouth daily.   Blood Glucose Monitoring Suppl (ONETOUCH VERIO) w/Device KIT Use to check blood sugar 3  times daily and document for visits.  Bring log to visits.   buPROPion (WELLBUTRIN XL) 300 MG 24 hr tablet TAKE 1 TABLET(300 MG) BY MOUTH DAILY   cetirizine (ZYRTEC) 10 MG tablet Take 10 mg by mouth daily.   Cholecalciferol (VITAMIN D-3 PO) Take 3,000 Units by mouth daily.    Coenzyme Q10 (CO Q-10 PO) Take by mouth daily.   EPIPEN 2-PAK 0.3 MG/0.3ML SOAJ injection as needed.    ezetimibe (ZETIA) 10 MG tablet TAKE 1 TABLET(10 MG) BY MOUTH DAILY   glucosamine-chondroitin 500-400 MG tablet Take 1 tablet by mouth 2 (two) times daily.   ipratropium (ATROVENT) 0.06 % nasal spray Place into both nostrils.   Magnesium 250 MG TABS Take by mouth daily.    metFORMIN (GLUCOPHAGE) 500 MG tablet Take 1 tablet (500 mg total) by mouth 2 (two) times daily with a meal.   Multiple Vitamins-Minerals (MULTIVITAMIN PO) Take by mouth daily.   nystatin (NYSTATIN) powder Apply topically 3 (three) times daily as needed.   nystatin cream (MYCOSTATIN) Apply 1 application topically 2 (two) times daily.   Olopatadine HCl 0.6 % SOLN Place 2 sprays into both nostrils 2 (two) times daily.   omeprazole (PRILOSEC) 20 MG capsule Take 20 mg by mouth daily.   Probiotic Product (PROBIOTIC COLON SUPPORT PO) Take by mouth. Take one pill every other day   valACYclovir (VALTREX) 1000 MG tablet TAKE 1 TABLET BY MOUTH TWICE DAILY AT ONSET OF FLARES FOR 3 TO 5 DAYS   vitamin B-12 (CYANOCOBALAMIN) 1000 MCG tablet Take 1,000 mcg  by mouth daily.   vitamin E 1000 UNIT capsule Take 1,000 Units by mouth daily.   No facility-administered encounter medications on file as of 08/11/2023.    Surgical History: Past Surgical History:  Procedure Laterality Date   ABDOMINAL HYSTERECTOMY     COLONOSCOPY     hysterectomy     with bladder tack   POLYPECTOMY     TUBAL LIGATION      Medical History: Past Medical History:  Diagnosis Date   Allergy    Arthritis    Depression    Diabetes (HCC)    GERD (gastroesophageal reflux disease)     Hyperlipidemia    Hypertension    Personal history of colonic adenomas 06/16/2013   4 adenomas (max 1 cm)  04/2003  no polyps 04/2005    Sleep apnea    uses c-pap    Family History: Non contributory to the present illness  Social History: Social History   Socioeconomic History   Marital status: Single    Spouse name: Not on file   Number of children: Not on file   Years of education: Not on file   Highest education level: 12th grade  Occupational History   Occupation: retired  Tobacco Use   Smoking status: Never   Smokeless tobacco: Never  Vaping Use   Vaping status: Never Used  Substance and Sexual Activity   Alcohol use: No   Drug use: No   Sexual activity: Not Currently  Other Topics Concern   Not on file  Social History Narrative   Not on file   Social Determinants of Health   Financial Resource Strain: Low Risk  (09/12/2022)   Overall Financial Resource Strain (CARDIA)    Difficulty of Paying Living Expenses: Not hard at all  Food Insecurity: No Food Insecurity (09/12/2022)   Hunger Vital Sign    Worried About Running Out of Food in the Last Year: Never true    Ran Out of Food in the Last Year: Never true  Transportation Needs: No Transportation Needs (09/12/2022)   PRAPARE - Administrator, Civil Service (Medical): No    Lack of Transportation (Non-Medical): No  Physical Activity: Insufficiently Active (09/12/2022)   Exercise Vital Sign    Days of Exercise per Week: 2 days    Minutes of Exercise per Session: 30 min  Stress: No Stress Concern Present (09/12/2022)   [employer] of Occupational Health - Occupational Stress Questionnaire    Feeling of Stress : Not at all  Social Connections: Moderately Isolated (09/12/2022)   Social Connection and Isolation Panel [NHANES]    Frequency of Communication with Friends and Family: More than three times a week    Frequency of Social Gatherings with Friends and Family: More than three times a week     Attends Religious Services: More than 4 times per year    Active Member of [employer] or Organizations: No    Attends Banker Meetings: Never    Marital Status: Divorced  Catering manager Violence: Not At Risk (09/12/2022)   Humiliation, Afraid, Rape, and Kick questionnaire    Fear of Current or Ex-Partner: No    Emotionally Abused: No    Physically Abused: No    Sexually Abused: No    Vital Signs: Blood pressure (!) 141/89, pulse 85, resp. rate 12, height 5\' 3"  (1.6 m), weight 202 lb (91.6 kg), SpO2 95%. Body mass index is 35.78 kg/m.    Examination: General Appearance: The patient is  well-developed, well-nourished, and in no distress. Neck Circumference: 39 cm Skin: Gross inspection of skin unremarkable. Head: normocephalic, no gross deformities. Eyes: no gross deformities noted. ENT: ears appear grossly normal Neurologic: Alert and oriented. No involuntary movements.  STOP BANG RISK ASSESSMENT S (snore) Have you been told that you snore?     NO   T (tired) Are you often tired, fatigued, or sleepy during the day?   NO  O (obstruction) Do you stop breathing, choke, or gasp during sleep? NO   P (pressure) Do you have or are you being treated for high blood pressure? YES   B (BMI) Is your body index greater than 35 kg/m? YES   A (age) Are you 33 years old or older? YES   N (neck) Do you have a neck circumference greater than 16 inches?   NO   G (gender) Are you a female? NO   TOTAL STOP/BANG "YES" ANSWERS 3       A STOP-Bang score of 2 or less is considered low risk, and a score of 5 or more is high risk for having either moderate or severe OSA. For people who score 3 or 4, doctors may need to perform further assessment to determine how likely they are to have OSA.         EPWORTH SLEEPINESS SCALE:  Scale:  (0)= no chance of dozing; (1)= slight chance of dozing; (2)= moderate chance of dozing; (3)= high chance of dozing  Chance  Situtation    Sitting and  reading: 0    Watching TV: 0    Sitting Inactive in public: 0    As a passenger in car: 0      Lying down to rest: 0    Sitting and talking: 0    Sitting quielty after lunch: 0    In a car, stopped in traffic: 0   TOTAL SCORE:   0 out of 24    SLEEP STUDIES:  PSG (10/03/16) AHI 10, REM AHI 58, SPO2 79%   CPAP COMPLIANCE DATA:  Date Range: 08/12/22 - 08/11/23  Average Daily Use: 8:26 hours  Median Use: 8:43 hours  Compliance for > 4 Hours: 352 days  AHI: 0.7 respiratory events per hour  Days Used: 352/365  Mask Leak: 22.7  95th Percentile Pressure: 7 cmh20         LABS: No results found for this or any previous visit (from the past 2160 hour(s)).  Radiology: MM 3D DIAGNOSTIC MAMMOGRAM UNILATERAL RIGHT BREAST  Result Date: 05/12/2023 CLINICAL DATA:  Patient was recalled from screening mammogram for a possible asymmetry in the right breast. EXAM: DIGITAL DIAGNOSTIC UNILATERAL RIGHT MAMMOGRAM WITH TOMOSYNTHESIS TECHNIQUE: Right digital diagnostic mammography and breast tomosynthesis was performed. COMPARISON:  Previous exam(s). ACR Breast Density Category b: There are scattered areas of fibroglandular density. FINDINGS: Additional imaging of the right breast was performed. No persistent mass, distortion or malignant type microcalcifications identified. IMPRESSION: No evidence of malignancy in the right breast. RECOMMENDATION: Bilateral screening mammogram in 1 year is recommended. I have discussed the findings and recommendations with the patient. If applicable, a reminder letter will be sent to the patient regarding the next appointment. BI-RADS CATEGORY  1: Negative. Electronically Signed   By: Baird Lyons M.D.   On: 05/12/2023 10:20   No results found.  No results found.    Assessment and Plan: Patient Active Problem List   Diagnosis Date Noted   OSA on CPAP 08/13/2021   Bilateral carotid  bruits 01/11/2020   Osteopenia of spine 07/23/2018    Depression, major, single episode, severe (HCC) 06/17/2018   GERD (gastroesophageal reflux disease) 11/03/2017   Advanced care planning/counseling discussion 11/03/2017   Morbid obesity (HCC) 03/13/2016   Type 2 diabetes mellitus with morbid obesity (HCC) 09/15/2015   Hypertension associated with type 2 diabetes mellitus (HCC) 09/15/2015   Hyperlipidemia associated with type 2 diabetes mellitus (HCC) 09/15/2015   Primary insomnia 09/15/2015   1. OSA on CPAP The patient does tolerate PAP and reports  benefit from PAP use. The patient was reminded how to clean equipment and advised to replace supplies routinely. The patient was also counselled on weight loss. The compliance is excellent. The AHI is 0.7.   OSA on cpap- controlled. Continue with excellent compliance with pap. CPAP continues to be medically necessary to treat this patient's OSA. F/u one year.    2. CPAP use counseling CPAP Counseling: had a lengthy discussion with the patient regarding the importance of PAP therapy in management of the sleep apnea. Patient appears to understand the risk factor reduction and also understands the risks associated with untreated sleep apnea. Patient will try to make a good faith effort to remain compliant with therapy. Also instructed the patient on proper cleaning of the device including the water must be changed daily if possible and use of distilled water is preferred. Patient understands that the machine should be regularly cleaned with appropriate recommended cleaning solutions that do not damage the PAP machine for example given white vinegar and water rinses. Other methods such as ozone treatment may not be as good as these simple methods to achieve cleaning.   3. Hypertension associated with type 2 diabetes mellitus (HCC) Hypertension Counseling:   The following hypertensive lifestyle modification were recommended and discussed:  1. Limiting alcohol intake to less than 1 oz/day of ethanol:(24  oz of beer or 8 oz of wine or 2 oz of 100-proof whiskey). 2. Take baby ASA 81 mg daily. 3. Importance of regular aerobic exercise and losing weight. 4. Reduce dietary saturated fat and cholesterol intake for overall cardiovascular health. 5. Maintaining adequate dietary potassium, calcium, and magnesium intake. 6. Regular monitoring of the blood pressure. 7. Reduce sodium intake to less than 100 mmol/day (less than 2.3 gm of sodium or less than 6 gm of sodium choride)      General Counseling: I have discussed the findings of the evaluation and examination with Clydie Braun.  I have also discussed any further diagnostic evaluation thatmay be needed or ordered today. Bergan verbalizes understanding of the findings of todays visit. We also reviewed her medications today and discussed drug interactions and side effects including but not limited excessive drowsiness and altered mental states. We also discussed that there is always a risk not just to her but also people around her. she has been encouraged to call the office with any questions or concerns that should arise related to todays visit.  No orders of the defined types were placed in this encounter.       I have personally obtained a history, examined the patient, evaluated laboratory and imaging results, formulated the assessment and plan and placed orders. This patient was seen today by Emmaline Kluver, PA-C in collaboration with Dr. Freda Munro.   Yevonne Pax, MD [facility] Diplomate ABMS Pulmonary Critical Care Medicine and Sleep Medicine

## 2023-08-11 NOTE — Patient Instructions (Signed)

## 2023-08-13 ENCOUNTER — Ambulatory Visit: Payer: Medicare PPO | Admitting: Nurse Practitioner

## 2023-08-13 ENCOUNTER — Encounter: Payer: Self-pay | Admitting: Nurse Practitioner

## 2023-08-13 DIAGNOSIS — E785 Hyperlipidemia, unspecified: Secondary | ICD-10-CM | POA: Diagnosis not present

## 2023-08-13 DIAGNOSIS — R0989 Other specified symptoms and signs involving the circulatory and respiratory systems: Secondary | ICD-10-CM

## 2023-08-13 DIAGNOSIS — E1159 Type 2 diabetes mellitus with other circulatory complications: Secondary | ICD-10-CM | POA: Diagnosis not present

## 2023-08-13 DIAGNOSIS — I152 Hypertension secondary to endocrine disorders: Secondary | ICD-10-CM

## 2023-08-13 DIAGNOSIS — E1169 Type 2 diabetes mellitus with other specified complication: Secondary | ICD-10-CM | POA: Diagnosis not present

## 2023-08-13 DIAGNOSIS — Z7984 Long term (current) use of oral hypoglycemic drugs: Secondary | ICD-10-CM

## 2023-08-13 DIAGNOSIS — F5101 Primary insomnia: Secondary | ICD-10-CM | POA: Diagnosis not present

## 2023-08-13 DIAGNOSIS — G4733 Obstructive sleep apnea (adult) (pediatric): Secondary | ICD-10-CM

## 2023-08-13 DIAGNOSIS — F322 Major depressive disorder, single episode, severe without psychotic features: Secondary | ICD-10-CM | POA: Diagnosis not present

## 2023-08-13 LAB — BAYER DCA HB A1C WAIVED: HB A1C (BAYER DCA - WAIVED): 5.7 % — ABNORMAL HIGH (ref 4.8–5.6)

## 2023-08-13 MED ORDER — TRAZODONE HCL 50 MG PO TABS: 25.0000 mg | ORAL_TABLET | Freq: Every evening | ORAL | 4 refills | Status: DC | PRN

## 2023-08-13 NOTE — Assessment & Plan Note (Signed)
Chronic, ongoing with A1c 5.7% today and urine ALB 01 February 2023. Recommend continued heavy focus on diet and exercise regimen.  Continue Metformin 500 MG BID, but advised her if consistent BS <70 reduce to once a day.  Check BS three mornings a week and document.  Physical in 6 months. - Up to date on eye and foot exams - Vaccinations up to date - ACE and statin on board

## 2023-08-13 NOTE — Assessment & Plan Note (Signed)
Chronic, stable.  Continue current medication regimen and adjust as needed.  Lipid panel today. 

## 2023-08-13 NOTE — Assessment & Plan Note (Signed)
Chronic, ongoing with no benefit from Melatonin.  Will trial Trazodone 25-50 MG PRN at night.  Educated her on this regimen and possible side effects to report.  Recommend: Sleep as long as necessary to feel rested (usually seven to eight hours for adults) and then get out of bed ?Maintain a regular sleep schedule, particularly a regular wake-up time in the morning ?Try not to force sleep ?Avoid caffeinated beverages after lunch ?Avoid alcohol near bedtime (eg, late afternoon and evening)  ?Avoid smoking or other nicotine intake, particularly during the evening ?Adjust the bedroom environment as needed to decrease stimuli (eg, reduce ambient light, turn off the television or radio) ?Avoid prolonged use of light-emitting screens (laptops, tablets, smartphones, ebooks) before bedtime  ?Resolve concerns or worries before bedtime ?Exercise regularly for at least 20 minutes, preferably more than four to five hours prior to bedtime  ?Avoid daytime naps, especially if they are longer than 20 to 30 minutes or occur late in the day

## 2023-08-13 NOTE — Assessment & Plan Note (Signed)
BMI 34.60, some loss present.  Recommended eating smaller high protein, low fat meals more frequently and exercising 30 mins a day 5 times a week with a goal of 10-15lb weight loss in the next 3 months. Patient voiced their understanding and motivation to adhere to these recommendations.

## 2023-08-13 NOTE — Assessment & Plan Note (Signed)
Chronic, ongoing.  Continue current medication regimen, Wellbutrin 300 MG daily.  Denies SI/HI -- is aware to notify provider immediately if SI presents or plan.  No medication adjustment at this time.

## 2023-08-13 NOTE — Assessment & Plan Note (Signed)
Chronic, stable.  BP at goal in office today for age.  Recommend she monitor BP at least a few mornings a week at home and document.  DASH diet at home.  Continue on Benazepril-HCTZ 20-12.5 MG dosing, is tolerating this well and BP remains stable.  Labs: BMP.  Return in 6 months.

## 2023-08-13 NOTE — Progress Notes (Signed)
BP 132/77   Pulse 70   Temp 98 F (36.7 C) (Oral)   Ht 5\' 4"  (1.626 m)   Wt 201 lb 9.6 oz (91.4 kg)   LMP  (LMP Unknown)   SpO2 97%   BMI 34.60 kg/m    Subjective:    Patient ID: Barbara Fitzpatrick, female    DOB: September 14, 1951, 72 y.o.   MRN: [id number]  HPI: Barbara Fitzpatrick is a 72 y.o. female  Chief Complaint  Patient presents with   Depression   Diabetes   Hyperlipidemia   Hypertension   DIABETES Diagnosed August 2022 with A1c 7.7% .  A1c 5.8% in February.  Taking Metformin 500 MG BID.  Has been working on diet changes, but has slacked off a little during summer.   Hypoglycemic episodes:no Polydipsia/polyuria: no Visual disturbance: no Chest pain: no Paresthesias: no Glucose Monitoring: yes             Accucheck frequency: TID             Fasting glucose: 88 to 191 via her log at different times, average is in 100 range             Post prandial:              Evening:              Before meals: Taking Insulin?: no             Long acting insulin:             Short acting insulin: Blood Pressure Monitoring: not checking Retinal Examination: Up to Date -- Dr. Walker Kehr at [facility] Foot Exam: Up to Date Pneumovax: Up to Date Influenza: Up to Date Aspirin: no    HYPERTENSION/HLD Continues on Benazepril 20 MG/HCTZ 12.5 MG & Lipitor 80 MG + Zetia. Continues to use CPAP nightly, saw Dr. Welton Flakes on 08/11/23.  Has known bilateral carotid bruits, saw vascular in 2021 and to monitor only at this time, no symptoms -- there was no stenosis on imaging at that time. Hypertension status: controlled  Satisfied with current treatment? yes Duration of hypertension: chronic BP monitoring frequency: not checking BP range:  BP medication side effects:  no Medication compliance: good compliance Previous BP meds: none Aspirin: yes Recurrent headaches: none Visual changes: no Palpitations: no Dyspnea: no Chest pain: no Lower extremity edema: no Dizzy/lightheaded: no  The ASCVD  Risk score (Arnett DK, et al., 2019) failed to calculate for the following reasons:   The valid total cholesterol range is 130 to 320 mg/dL  DEPRESSION Continues on Wellbutrin XL daily.  [name] been fatigued due to poor sleep. Mood status: controlled Satisfied with current treatment?: is satisfied with medicaiton Symptom severity: mild  Duration of current treatment : chronic Side effects: no Medication compliance: excellent compliance Psychotherapy/counseling: no never gone Previous psychiatric medications:none Depressed mood: no Anxious mood: no Anhedonia: no Significant weight loss or gain: yes Insomnia: occasional, tried Melatonin with no benefit over past months, slept 4 hours last night Fatigue: no Feelings of worthlessness or guilt: no Impaired concentration/indecisiveness: no Suicidal ideations: no Hopelessness: no Crying spells: no    08/13/2023    9:40 AM 02/12/2023    9:47 AM 09/12/2022    1:24 PM 09/02/2022   10:31 AM 08/05/2022   10:52 AM  Depression screen PHQ 2/9  Decreased Interest 1 1 0 0 1  Down, Depressed, Hopeless 1 1 0 1 1  PHQ - 2 Score 2  2 0 1 2  Altered sleeping 1 1 1 1 1   Tired, decreased energy 1 2 1 2  0  Change in appetite 1 2 1 2  0  Feeling bad or failure about yourself  0 0 0 0 0  Trouble concentrating 0 0 0 0 1  Moving slowly or fidgety/restless 0 0 0 1 0  Suicidal thoughts 0 0 0 0 0  PHQ-9 Score 5 7 3 7 4   Difficult doing work/chores Somewhat difficult  Not difficult at all Somewhat difficult Not difficult at all       08/13/2023    9:40 AM 02/12/2023    9:48 AM 09/02/2022   10:32 AM 08/05/2022   10:53 AM  GAD 7 : Generalized Anxiety Score  Nervous, Anxious, on Edge 1 1 1 1   Control/stop worrying 1 1 0 1  Worry too much - different things 1 1 1 1   Trouble relaxing 2 1 1 1   Restless 1 0 1 0  Easily annoyed or irritable 0 1 0 0  Afraid - awful might happen 0 0 0 1  Total GAD 7 Score 6 5 4 5   Anxiety Difficulty Not difficult at all Somewhat  difficult Somewhat difficult Not difficult at all   Relevant past medical, surgical, family and social history reviewed and updated as indicated. Interim medical history since our last visit reviewed. Allergies and medications reviewed and updated.  Review of Systems  Constitutional:  Negative for activity change, appetite change, diaphoresis, fatigue and fever.  Respiratory:  Negative for cough, chest tightness and shortness of breath.   Cardiovascular:  Negative for chest pain, palpitations and leg swelling.  Gastrointestinal: Negative.   Endocrine: Negative for polydipsia, polyphagia and polyuria.  Neurological: Negative.   Psychiatric/Behavioral: Negative.      Per HPI unless specifically indicated above     Objective:    BP 132/77   Pulse 70   Temp 98 F (36.7 C) (Oral)   Ht 5\' 4"  (1.626 m)   Wt 201 lb 9.6 oz (91.4 kg)   LMP  (LMP Unknown)   SpO2 97%   BMI 34.60 kg/m   Wt Readings from Last 3 Encounters:  08/13/23 201 lb 9.6 oz (91.4 kg)  08/11/23 202 lb (91.6 kg)  02/12/23 196 lb 11.2 oz (89.2 kg)    Physical Exam Vitals and nursing note reviewed.  Constitutional:      General: She is awake. She is not in acute distress.    Appearance: She is well-developed and well-groomed. She is obese. She is not ill-appearing or toxic-appearing.  HENT:     Head: Normocephalic.     Right Ear: Hearing and external ear normal.     Left Ear: Hearing and external ear normal.  Eyes:     General: Lids are normal.        Right eye: No discharge.        Left eye: No discharge.     Conjunctiva/sclera: Conjunctivae normal.     Pupils: Pupils are equal, round, and reactive to light.  Neck:     Thyroid: No thyromegaly.     Vascular: Carotid bruit (remain mild) present.  Cardiovascular:     Rate and Rhythm: Normal rate and regular rhythm.     Heart sounds: Normal heart sounds. No murmur heard.    No gallop.  Pulmonary:     Effort: Pulmonary effort is normal. No accessory muscle  usage or respiratory distress.     Breath sounds: Normal breath  sounds.  Abdominal:     General: Bowel sounds are normal. There is no distension.     Palpations: Abdomen is soft.     Tenderness: There is no abdominal tenderness.  Musculoskeletal:     Cervical back: Normal range of motion and neck supple.     Right lower leg: No edema.     Left lower leg: No edema.  Lymphadenopathy:     Cervical: No cervical adenopathy.  Skin:    General: Skin is warm and dry.  Neurological:     Mental Status: She is alert and oriented to person, place, and time.     Deep Tendon Reflexes: Reflexes are normal and symmetric.     Reflex Scores:      Brachioradialis reflexes are 2+ on the right side and 2+ on the left side.      Patellar reflexes are 2+ on the right side and 2+ on the left side. Psychiatric:        Attention and Perception: Attention normal.        Mood and Affect: Mood normal.        Speech: Speech normal.        Behavior: Behavior normal. Behavior is cooperative.        Thought Content: Thought content normal.    Results for orders placed or performed in visit on 02/12/23  Comprehensive metabolic panel  Result Value Ref Range   Glucose 103 (H) 70 - 99 mg/dL   BUN 13 8 - 27 mg/dL   Creatinine, Ser 2.84 0.57 - 1.00 mg/dL   eGFR 83 >13 KG/MWN/0.27   BUN/Creatinine Ratio 17 12 - 28   Sodium 141 134 - 144 mmol/L   Potassium 3.9 3.5 - 5.2 mmol/L   Chloride 102 96 - 106 mmol/L   CO2 26 20 - 29 mmol/L   Calcium 10.1 8.7 - 10.3 mg/dL   Total Protein 5.8 (L) 6.0 - 8.5 g/dL   Albumin 3.9 3.9 - 4.9 g/dL   Globulin, Total 1.9 1.5 - 4.5 g/dL   Albumin/Globulin Ratio 2.1 1.2 - 2.2   Bilirubin Total 0.5 0.0 - 1.2 mg/dL   Alkaline Phosphatase 84 44 - 121 IU/L   AST 22 0 - 40 IU/L   ALT 27 0 - 32 IU/L  CBC with Differential/Platelet  Result Value Ref Range   WBC 9.2 3.4 - 10.8 x10E3/uL   RBC 4.72 3.77 - 5.28 x10E6/uL   Hemoglobin 14.6 11.1 - 15.9 g/dL   Hematocrit 25.3 66.4 - 46.6 %    MCV 91 79 - 97 fL   MCH 30.9 26.6 - 33.0 pg   MCHC 34.1 31.5 - 35.7 g/dL   RDW 40.3 47.4 - 25.9 %   Platelets 269 150 - 450 x10E3/uL   Neutrophils 63 Not Estab. %   Lymphs 31 Not Estab. %   Monocytes 5 Not Estab. %   Eos 1 Not Estab. %   Basos 0 Not Estab. %   Neutrophils Absolute 5.8 1.4 - 7.0 x10E3/uL   Lymphocytes Absolute 2.9 0.7 - 3.1 x10E3/uL   Monocytes Absolute 0.5 0.1 - 0.9 x10E3/uL   EOS (ABSOLUTE) 0.1 0.0 - 0.4 x10E3/uL   Basophils Absolute 0.0 0.0 - 0.2 x10E3/uL   Immature Granulocytes 0 Not Estab. %   Immature Grans (Abs) 0.0 0.0 - 0.1 x10E3/uL  Lipid Panel w/o Chol/HDL Ratio  Result Value Ref Range   Cholesterol, Total 106 100 - 199 mg/dL   Triglycerides 98 0 - 149  mg/dL   HDL 44 >16 mg/dL   VLDL Cholesterol Cal 19 5 - 40 mg/dL   LDL Chol Calc (NIH) 43 0 - 99 mg/dL  TSH  Result Value Ref Range   TSH 2.670 0.450 - 4.500 uIU/mL  VITAMIN D 25 Hydroxy (Vit-D Deficiency, Fractures)  Result Value Ref Range   Vit D, 25-Hydroxy 55.9 30.0 - 100.0 ng/mL  Magnesium  Result Value Ref Range   Magnesium 2.0 1.6 - 2.3 mg/dL  Urine Microalbumin w/creat. ratio  Result Value Ref Range   Creatinine, Urine 46.0 Not Estab. mg/dL   Microalbumin, Urine 6.9 Not Estab. ug/mL   Microalb/Creat Ratio 15 0 - 29 mg/g creat  Hemoglobin A1c  Result Value Ref Range   Hgb A1c MFr Bld 5.8 (H) 4.8 - 5.6 %   Est. average glucose Bld gHb Est-mCnc 120 mg/dL      Assessment & Plan:   Problem List Items Addressed This Visit       Cardiovascular and Mediastinum   Hypertension associated with type 2 diabetes mellitus (HCC)    Chronic, stable.  BP at goal in office today for age.  Recommend she monitor BP at least a few mornings a week at home and document.  DASH diet at home.  Continue on Benazepril-HCTZ 20-12.5 MG dosing, is tolerating this well and BP remains stable.  Labs: BMP.  Return in 6 months.       Relevant Orders   Bayer DCA Hb A1c Waived   Basic metabolic panel      Respiratory   OSA on CPAP    Chronic, continue to use CPAP 100% of the time.  Continue to collaborate with pulmonary.        Endocrine   Hyperlipidemia associated with type 2 diabetes mellitus (HCC)    Chronic, stable.  Continue current medication regimen and adjust as needed.  Lipid panel today.      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 Recommend continued heavy focus on diet and exercise regimen.  Continue Metformin 500 MG BID, but advised her if consistent BS <70 reduce to once a day.  Check BS three mornings a week and document.  Physical in 6 months. - Up to date on eye and foot exams - Vaccinations up to date - ACE and statin on board      Relevant Orders   Bayer DCA Hb A1c Waived   Basic metabolic panel     Other   Bilateral carotid bruits    Ongoing and stable.  She remains asymptomatic and 2021 imaging by vascular noted no stenosis.  Discussed at length preventative measures, including goal for LDL <70 and BP <130/80. She is to alert provider if any symptoms present.  Will plan on returning to vascular if symptoms present or worsening bruits noted.  At this time continue preventative measures, including ASA and statin.  Plan for repeat imaging if worsening.      Depression, major, single episode, severe (HCC)    Chronic, ongoing.  Continue current medication regimen, Wellbutrin 300 MG daily.  Denies SI/HI -- is aware to notify provider immediately if SI presents or plan.  No medication adjustment at this time.        Relevant Medications   traZODone (DESYREL) 50 MG tablet  Morbid obesity (HCC)    BMI 34.60, some loss present.  Recommended eating smaller high protein, low fat meals more frequently and exercising 30 mins a day 5 times a week with a goal of 10-15lb weight loss in the next 3 months.  Patient voiced their understanding and motivation to adhere to these recommendations.       Primary insomnia    Chronic, ongoing with no benefit from Melatonin.  Will trial Trazodone 25-50 MG PRN at night.  Educated her on this regimen and possible side effects to report.  Recommend: Sleep as long as necessary to feel rested (usually seven to eight hours for adults) and then get out of bed ?Maintain a regular sleep schedule, particularly a regular wake-up time in the morning ?Try not to force sleep ?Avoid caffeinated beverages after lunch ?Avoid alcohol near bedtime (eg, late afternoon and evening)  ?Avoid smoking or other nicotine intake, particularly during the evening ?Adjust the bedroom environment as needed to decrease stimuli (eg, reduce ambient light, turn off the television or radio) ?Avoid prolonged use of light-emitting screens (laptops, tablets, smartphones, ebooks) before bedtime  ?Resolve concerns or worries before bedtime ?Exercise regularly for at least 20 minutes, preferably more than four to five hours prior to bedtime  ?Avoid daytime naps, especially if they are longer than 20 to 30 minutes or occur late in the day         Follow up plan: Return in about 6 weeks (around 09/24/2023) for INSOMNIA -- added Trazodone + needs physical after 02/13/24 and nurse wellness after 09/13/23.

## 2023-08-13 NOTE — Assessment & Plan Note (Addendum)
Chronic, continue to use CPAP 100% of the time.  Continue to collaborate with pulmonary.

## 2023-08-13 NOTE — Assessment & Plan Note (Signed)
Ongoing and stable.  She remains asymptomatic and 2021 imaging by vascular noted no stenosis.  Discussed at length preventative measures, including goal for LDL <70 and BP <130/80. She is to alert provider if any symptoms present.  Will plan on returning to vascular if symptoms present or worsening bruits noted.  At this time continue preventative measures, including ASA and statin.  Plan for repeat imaging if worsening.

## 2023-08-14 DIAGNOSIS — J3089 Other allergic rhinitis: Secondary | ICD-10-CM | POA: Diagnosis not present

## 2023-08-14 DIAGNOSIS — J3081 Allergic rhinitis due to animal (cat) (dog) hair and dander: Secondary | ICD-10-CM | POA: Diagnosis not present

## 2023-08-14 DIAGNOSIS — J301 Allergic rhinitis due to pollen: Secondary | ICD-10-CM | POA: Diagnosis not present

## 2023-08-14 LAB — BASIC METABOLIC PANEL
BUN/Creatinine Ratio: 14 (ref 12–28)
BUN: 11 mg/dL (ref 8–27)
CO2: 26 mmol/L (ref 20–29)
Calcium: 10.3 mg/dL (ref 8.7–10.3)
Chloride: 102 mmol/L (ref 96–106)
Creatinine, Ser: 0.78 mg/dL (ref 0.57–1.00)
Glucose: 100 mg/dL — ABNORMAL HIGH (ref 70–99)
Potassium: 3.8 mmol/L (ref 3.5–5.2)
Sodium: 143 mmol/L (ref 134–144)
eGFR: 81 mL/min/{1.73_m2} (ref >59)

## 2023-08-14 LAB — LIPID PANEL W/O CHOL/HDL RATIO
Cholesterol, Total: 120 mg/dL (ref 100–199)
HDL: 42 mg/dL (ref >39)
LDL Chol Calc (NIH): 51 mg/dL (ref 0–99)
Triglycerides: 163 mg/dL — ABNORMAL HIGH (ref 0–149)
VLDL Cholesterol Cal: 27 mg/dL (ref 5–40)

## 2023-08-14 NOTE — Progress Notes (Signed)
Good afternoon, please let Barbara Fitzpatrick know her labs have returned and these are overall stable.  No medication changes needed.  Great job!! Keep being stellar!!  Thank you for allowing me to participate in your care.  I appreciate you. Kindest regards, Serenna Deroy

## 2023-08-15 ENCOUNTER — Other Ambulatory Visit: Payer: Self-pay | Admitting: Nurse Practitioner

## 2023-08-15 NOTE — Telephone Encounter (Signed)
Requested Prescriptions  Pending Prescriptions Disp Refills   buPROPion (WELLBUTRIN XL) 300 MG 24 hr tablet [Pharmacy Med Name: BUPROPION XL 300MG  TABLETS] 90 tablet 1    Sig: TAKE 1 TABLET(300 MG) BY MOUTH DAILY     Psychiatry: Antidepressants - bupropion Passed - 08/15/2023  3:17 AM      Passed - Cr in normal range and within 360 days    Creatinine, Ser  Date Value Ref Range Status  08/13/2023 0.78 0.57 - 1.00 mg/dL Final         Passed - AST in normal range and within 360 days    AST  Date Value Ref Range Status  02/12/2023 22 0 - 40 IU/L Final         Passed - ALT in normal range and within 360 days    ALT  Date Value Ref Range Status  02/12/2023 27 0 - 32 IU/L Final         Passed - Completed PHQ-2 or PHQ-9 in the last 360 days      Passed - Last BP in normal range    BP Readings from Last 1 Encounters:  08/13/23 132/77         Passed - Valid encounter within last 6 months    Recent Outpatient Visits           2 days ago Type 2 diabetes mellitus with morbid obesity (HCC)   [place] [facility] [place], Boykin T, NP   6 months ago Type 2 diabetes mellitus with morbid obesity (HCC)   [place] [facility] [place], Diablo T, NP   11 months ago Hypertension associated with type 2 diabetes mellitus (HCC)   [place] [facility] [place], Gaylord T, NP   1 year ago Type 2 diabetes mellitus with morbid obesity (HCC)   [place] [facility] [place], [place] T, NP   1 year ago Type 2 diabetes mellitus with morbid obesity (HCC)   [place] [facility] [place], Dorie Rank, NP       Future Appointments             In 1 month [place], Dorie Rank, NP [place] [facility]   In 6 months Philip, Dorie Rank, NP [place] [facility]

## 2023-08-21 DIAGNOSIS — J301 Allergic rhinitis due to pollen: Secondary | ICD-10-CM | POA: Diagnosis not present

## 2023-08-21 DIAGNOSIS — J3081 Allergic rhinitis due to animal (cat) (dog) hair and dander: Secondary | ICD-10-CM | POA: Diagnosis not present

## 2023-08-21 DIAGNOSIS — J3089 Other allergic rhinitis: Secondary | ICD-10-CM | POA: Diagnosis not present

## 2023-08-29 DIAGNOSIS — J3081 Allergic rhinitis due to animal (cat) (dog) hair and dander: Secondary | ICD-10-CM | POA: Diagnosis not present

## 2023-08-29 DIAGNOSIS — J3089 Other allergic rhinitis: Secondary | ICD-10-CM | POA: Diagnosis not present

## 2023-08-29 DIAGNOSIS — J301 Allergic rhinitis due to pollen: Secondary | ICD-10-CM | POA: Diagnosis not present

## 2023-09-04 DIAGNOSIS — J3089 Other allergic rhinitis: Secondary | ICD-10-CM | POA: Diagnosis not present

## 2023-09-04 DIAGNOSIS — J3081 Allergic rhinitis due to animal (cat) (dog) hair and dander: Secondary | ICD-10-CM | POA: Diagnosis not present

## 2023-09-04 DIAGNOSIS — J301 Allergic rhinitis due to pollen: Secondary | ICD-10-CM | POA: Diagnosis not present

## 2023-09-11 DIAGNOSIS — J301 Allergic rhinitis due to pollen: Secondary | ICD-10-CM | POA: Diagnosis not present

## 2023-09-11 DIAGNOSIS — J3089 Other allergic rhinitis: Secondary | ICD-10-CM | POA: Diagnosis not present

## 2023-09-11 DIAGNOSIS — J3081 Allergic rhinitis due to animal (cat) (dog) hair and dander: Secondary | ICD-10-CM | POA: Diagnosis not present

## 2023-09-16 ENCOUNTER — Ambulatory Visit: Payer: Medicare PPO | Admitting: Emergency Medicine

## 2023-09-16 VITALS — BP 127/82 | HR 89 | Ht 64.0 in | Wt 197.0 lb

## 2023-09-16 DIAGNOSIS — Z0000 Encounter for general adult medical examination without abnormal findings: Secondary | ICD-10-CM | POA: Diagnosis not present

## 2023-09-16 DIAGNOSIS — Z780 Asymptomatic menopausal state: Secondary | ICD-10-CM | POA: Diagnosis not present

## 2023-09-16 NOTE — Progress Notes (Signed)
Subjective:   Barbara Fitzpatrick is a 72 y.o. female who presents for Medicare Annual (Subsequent) preventive examination.  Visit Complete: Virtual  I connected with  Barbara Fitzpatrick on 09/16/23 by a audio enabled telemedicine application and verified that I am speaking with the correct person using two identifiers.  Patient Location: Home  Provider Location: Office/Clinic  I discussed the limitations of evaluation and management by telemedicine. The patient expressed understanding and agreed to proceed.  Vital Signs: Because this visit was a virtual/telehealth visit, some criteria may be missing or patient reported. Any vitals not documented were not able to be obtained and vitals that have been documented are patient reported.    Cardiac Risk Factors include: advanced age (>77men, >12 women);diabetes mellitus;dyslipidemia;hypertension;obesity (BMI >30kg/m2);Other (see comment), Risk factor comments: OSA w/cpap     Objective:    Today's Vitals   09/16/23 1333 09/16/23 1334  BP: 127/82   Pulse: 89   Weight: 197 lb (89.4 kg)   Height: 5\' 4"  (1.626 m)   PainSc:  8    Body mass index is 33.81 kg/m.     09/16/2023    1:47 PM 09/07/2021    5:22 PM 08/11/2020   11:17 AM 08/02/2019   11:13 AM 06/15/2018    8:44 AM 04/10/2017    1:35 PM  Advanced Directives  Does Patient Have a Medical Advance Directive? Yes Yes Yes Yes Yes Yes  Type of Estate agent of [place];Living will Healthcare Power of [place];Living will Healthcare Power of [place];Living will Living will;Healthcare Power of [employer] Power of [place];Living will Living will;Healthcare Power of Attorney  Does patient want to make changes to medical advance directive? No - Patient declined No - Patient declined      Copy of Healthcare Power of Attorney in Chart? No - copy requested  No - copy requested No - copy requested No - copy requested No - copy requested    Current Medications  (verified) Outpatient Encounter Medications as of 09/16/2023  Medication Sig   ACCU-CHEK GUIDE test strip TEST THREE TIMES DAILY   Accu-Chek Softclix Lancets lancets USE TO CHECK BLOOD SUGAR THREE TIMES DAILY AND DOCUMENT FOR VISITS. BRING LOG TO VISITS   acyclovir ointment (ZOVIRAX) 5 % Apply 1 application topically 3 (three) times daily as needed.   aspirin 81 MG tablet Take 81 mg by mouth daily.   atorvastatin (LIPITOR) 80 MG tablet TAKE 1 TABLET(80 MG) BY MOUTH DAILY   benazepril-hydrochlorthiazide (LOTENSIN HCT) 20-12.5 MG tablet Take 1 tablet by mouth daily.   Blood Glucose Monitoring Suppl (ONETOUCH VERIO) w/Device KIT Use to check blood sugar 3 times daily and document for visits.  Bring log to visits.   buPROPion (WELLBUTRIN XL) 300 MG 24 hr tablet TAKE 1 TABLET(300 MG) BY MOUTH DAILY   cetirizine (ZYRTEC) 10 MG tablet Take 10 mg by mouth daily.   Cholecalciferol (VITAMIN D-3 PO) Take 3,000 Units by mouth daily.    Coenzyme Q10 (CO Q-10 PO) Take by mouth daily.   EPIPEN 2-PAK 0.3 MG/0.3ML SOAJ injection as needed.    ezetimibe (ZETIA) 10 MG tablet TAKE 1 TABLET(10 MG) BY MOUTH DAILY   glucosamine-chondroitin 500-400 MG tablet Take 1 tablet by mouth 2 (two) times daily.   Magnesium 250 MG TABS Take by mouth daily.    metFORMIN (GLUCOPHAGE) 500 MG tablet Take 1 tablet (500 mg total) by mouth 2 (two) times daily with a meal.   Multiple Vitamins-Minerals (MULTIVITAMIN PO) Take by  mouth daily.   nystatin (NYSTATIN) powder Apply topically 3 (three) times daily as needed.   nystatin cream (MYCOSTATIN) Apply 1 application topically 2 (two) times daily.   Olopatadine HCl 0.6 % SOLN Place 2 sprays into both nostrils 2 (two) times daily.   omeprazole (PRILOSEC) 20 MG capsule Take 20 mg by mouth daily.   traZODone (DESYREL) 50 MG tablet Take 0.5-1 tablets (25-50 mg total) by mouth at bedtime as needed for sleep.   valACYclovir (VALTREX) 1000 MG tablet TAKE 1 TABLET BY MOUTH TWICE DAILY AT ONSET  OF FLARES FOR 3 TO 5 DAYS   vitamin B-12 (CYANOCOBALAMIN) 1000 MCG tablet Take 1,000 mcg by mouth daily.   vitamin E 1000 UNIT capsule Take 1,000 Units by mouth daily.   ipratropium (ATROVENT) 0.06 % nasal spray Place into both nostrils. (Patient not taking: Reported on 09/16/2023)   Probiotic Product (PROBIOTIC COLON SUPPORT PO) Take by mouth. Take one pill every other day (Patient not taking: Reported on 09/16/2023)   No facility-administered encounter medications on file as of 09/16/2023.    Allergies (verified) Patient has no known allergies.   History: Past Medical History:  Diagnosis Date   Allergy    Arthritis    Depression    Diabetes (HCC)    GERD (gastroesophageal reflux disease)    Hyperlipidemia    Hypertension    Personal history of colonic adenomas 06/16/2013   4 adenomas (max 1 cm)  04/2003  no polyps 04/2005    Sleep apnea    uses c-pap   Past Surgical History:  Procedure Laterality Date   ABDOMINAL HYSTERECTOMY     COLONOSCOPY     hysterectomy     with bladder tack   POLYPECTOMY     TUBAL LIGATION     Family History  Problem Relation Age of Onset   Hypertension Mother    Hyperlipidemia Mother    Diabetes Mother    Cancer Mother        uterine or ovarian cancer   Glaucoma Mother    Heart disease Father    Hypertension Father    Hyperlipidemia Father    Diabetes Father    Diabetes Sister    Lung disease Sister        polyps   Cancer Brother        kidney- on dialysis   Glaucoma Brother    Hyperlipidemia Brother    Hypertension Brother    Cancer Maternal Aunt 50       ovarian   Stroke Maternal Grandfather    Diabetes Paternal Grandmother    Heart disease Paternal Grandfather    Esophageal cancer Neg Hx    Rectal cancer Neg Hx    Stomach cancer Neg Hx    Breast cancer Neg Hx    Colon polyps Neg Hx    Social History   Socioeconomic History   Marital status: Widowed    Spouse name: Not on file   Number of children: 2   Years of education:  Not on file   Highest education level: 12th grade  Occupational History   Occupation: retired    Comment: asst. Air traffic controller for [employer]  Tobacco Use   Smoking status: Never   Smokeless tobacco: Never  Vaping Use   Vaping status: Never Used  Substance and Sexual Activity   Alcohol use: No   Drug use: No   Sexual activity: Not Currently  Other Topics Concern   Not on file  Social History  Narrative   Widowed, 2 daughters (twins)   Social Determinants of Health   Financial Resource Strain: Low Risk  (09/16/2023)   Overall Financial Resource Strain (CARDIA)    Difficulty of Paying Living Expenses: Not hard at all  Food Insecurity: No Food Insecurity (09/16/2023)   Hunger Vital Sign    Worried About Running Out of Food in the Last Year: Never true    Ran Out of Food in the Last Year: Never true  Transportation Needs: No Transportation Needs (09/16/2023)   PRAPARE - Administrator, Civil Service (Medical): No    Lack of Transportation (Non-Medical): No  Physical Activity: Insufficiently Active (09/16/2023)   Exercise Vital Sign    Days of Exercise per Week: 2 days    Minutes of Exercise per Session: 30 min  Stress: No Stress Concern Present (09/16/2023)   [employer] of Occupational Health - Occupational Stress Questionnaire    Feeling of Stress : Not at all  Social Connections: Moderately Isolated (09/16/2023)   Social Connection and Isolation Panel [NHANES]    Frequency of Communication with Friends and Family: More than three times a week    Frequency of Social Gatherings with Friends and Family: More than three times a week    Attends Religious Services: More than 4 times per year    Active Member of [employer] or Organizations: No    Attends Banker Meetings: Never    Marital Status: Widowed    Tobacco Counseling Counseling given: Not Answered   Clinical Intake:  Pre-visit preparation completed: Yes  Pain : 0-10 Pain Score: 8  Pain Type:  Chronic pain Pain Location: Hip Pain Orientation: Right Pain Descriptors / Indicators: Aching     BMI - recorded: 33.81 Nutritional Status: BMI > 30  Obese Nutritional Risks: None Diabetes: Yes CBG done?: No (FBS 107 per patient) Did pt. bring in CBG monitor from home?: No  How often do you need to have someone help you when you read instructions, pamphlets, or other written materials from your doctor or pharmacy?: 1 - Never  Interpreter Needed?: No  Information entered by :: Tora Kindred, CMA   Activities of Daily Living    09/16/2023    1:36 PM 02/12/2023   10:12 AM  In your present state of health, do you have any difficulty performing the following activities:  Hearing? 0 0  Vision? 0 0  Difficulty concentrating or making decisions? 0 0  Walking or climbing stairs? 1 0  Comment patient has right hip pain   Dressing or bathing? 0 0  Doing errands, shopping? 0 0  Preparing Food and eating ? N   Using the Toilet? N   In the past six months, have you accidently leaked urine? N   Do you have problems with loss of bowel control? N   Managing your Medications? N   Managing your Finances? N   Housekeeping or managing your Housekeeping? N     Patient Care Team: Marjie Skiff, NP as PCP - General (Nurse Practitioner)  Indicate any recent Medical Services you may have received from other than [facility] providers in the past year (date may be approximate).     Assessment:   This is a routine wellness examination for Barbara Fitzpatrick.  Hearing/Vision screen Hearing Screening - Comments:: Denies hearing loss Vision Screening - Comments:: Gets routine eye exams   Goals Addressed               This Visit's  Progress     Patient Stated (pt-stated)        Stay active      Depression Screen    09/16/2023    1:45 PM 08/13/2023    9:40 AM 02/12/2023    9:47 AM 09/12/2022    1:24 PM 09/02/2022   10:31 AM 08/05/2022   10:52 AM 02/05/2022   10:43 AM  PHQ 2/9 Scores  PHQ - 2  Score 0 2 2 0 1 2 1   PHQ- 9 Score 0 5 7 3 7 4 4     Fall Risk    09/16/2023    1:48 PM 08/13/2023    9:39 AM 02/12/2023    9:47 AM 09/12/2022    1:27 PM 09/02/2022   10:31 AM  Fall Risk   Falls in the past year? 0 0 0 0 0  Number falls in past yr: 0 0 0 0 0  Injury with Fall? 0 0 0 0 0  Risk for fall due to : No Fall Risks No Fall Risks No Fall Risks No Fall Risks No Fall Risks  Follow up Falls prevention discussed Falls evaluation completed Falls evaluation completed Falls evaluation completed Falls evaluation completed    MEDICARE RISK AT HOME: Medicare Risk at Home Any stairs in or around the home?: Yes If so, are there any without handrails?: No Home free of loose throw rugs in walkways, pet beds, electrical cords, etc?: Yes Adequate lighting in your home to reduce risk of falls?: Yes Life alert?: No Use of a cane, walker or w/c?: No Grab bars in the bathroom?: Yes Shower chair or bench in shower?: Yes Elevated toilet seat or a handicapped toilet?: Yes  TIMED UP AND GO:  Was the test performed?  No    Cognitive Function:        09/16/2023    1:49 PM 09/12/2022    1:27 PM 09/07/2021    5:24 PM 01/25/2021   10:41 AM 08/11/2020   11:21 AM  6CIT Screen  What Year? 0 points 0 points 0 points 0 points 0 points  What month? 0 points 0 points 0 points 0 points 0 points  What time? 0 points 0 points 0 points 0 points 0 points  Count back from 20 0 points 0 points 0 points 0 points 0 points  Months in reverse 0 points 0 points 0 points 2 points 0 points  Repeat phrase 2 points 2 points 0 points 0 points 2 points  Total Score 2 points 2 points 0 points 2 points 2 points    Immunizations Immunization History  Administered Date(s) Administered   Fluad Quad(high Dose 65+) 09/22/2019, 09/14/2020, 09/06/2021   Influenza, High Dose Seasonal PF 12/31/2016, 11/03/2017, 12/30/2017, 09/14/2018, 01/01/2019, 12/28/2019, 12/26/2020, 12/28/2021   Influenza,inj,Quad PF,6+ Mos 09/15/2015,  10/15/2016   Influenza-Unspecified 10/02/2022   Moderna Sars-Covid-2 Vaccination 02/08/2020, 03/07/2020, 12/26/2020, 07/17/2021, 11/23/2021   Pneumococcal Conjugate-13 11/03/2017   Pneumococcal Polysaccharide-23 01/01/2019, 09/22/2019, 12/28/2019, 12/26/2020, 12/28/2021   Td 08/05/2022   Tdap 04/23/2012   Zoster Recombinant(Shingrix) 10/16/2020, 02/06/2021   Zoster, Live 03/16/2014    TDAP status: Up to date  Flu Vaccine status: Due, Education has been provided regarding the importance of this vaccine. Advised may receive this vaccine at local pharmacy or Health Dept. Aware to provide a copy of the vaccination record if obtained from local pharmacy or Health Dept. Verbalized acceptance and understanding.  Pneumococcal vaccine status: Up to date  Covid-19 vaccine status: Information provided on how to obtain  vaccines.   Qualifies for Shingles Vaccine? Yes   Zostavax completed No   Shingrix Completed?: Yes  Screening Tests Health Maintenance  Topic Date Due   DEXA SCAN  07/23/2023   COVID-19 Vaccine (6 - 2023-24 season) 08/24/2023   INFLUENZA VACCINE  03/22/2024 (Originally 07/24/2023)   OPHTHALMOLOGY EXAM  10/15/2023   Diabetic kidney evaluation - Urine ACR  02/13/2024   FOOT EXAM  02/13/2024   HEMOGLOBIN A1C  02/13/2024   MAMMOGRAM  05/05/2024   Diabetic kidney evaluation - eGFR measurement  08/12/2024   Medicare Annual Wellness (AWV)  09/15/2024   Colonoscopy  07/08/2025   DTaP/Tdap/Td (3 - Td or Tdap) 08/05/2032   Pneumonia Vaccine 55+ Years old  Completed   Hepatitis C Screening  Completed   Zoster Vaccines- Shingrix  Completed   HPV VACCINES  Aged Out    Health Maintenance  Health Maintenance Due  Topic Date Due   DEXA SCAN  07/23/2023   COVID-19 Vaccine (6 - 2023-24 season) 08/24/2023    Colorectal cancer screening: Type of screening: Colonoscopy. Completed 07/08/22. Repeat every 3 years  Mammogram status: Completed 05/06/23. Repeat every year  Bone Density  status: Completed 07/22/18. Results reflect: Bone density results: OSTEOPENIA. Repeat every 5 years.  Lung Cancer Screening: (Low Dose CT Chest recommended if Age 96-80 years, 20 pack-year currently smoking OR have quit w/in 15years.) does not qualify.   Lung Cancer Screening Referral: n/a  Additional Screening:  Hepatitis C Screening: does not qualify; Completed 09/15/15  Vision Screening: Recommended annual ophthalmology exams for early detection of glaucoma and other disorders of the eye. Is the patient up to date with their annual eye exam?  Yes , appointment scheduled 10/15/23 Who is the provider or what is the name of the office in which the patient attends annual eye exams? Dr. Lew Dawes, [facility] If pt is not established with a provider, would they like to be referred to a provider to establish care? No .   Dental Screening: Recommended annual dental exams for proper oral hygiene  Diabetic Foot Exam: Diabetic Foot Exam: Completed 02/12/23  Community Resource Referral / Chronic Care Management: CRR required this visit?  No   CCM required this visit?  No     Plan:     I have personally reviewed and noted the following in the patient's chart:   Medical and social history Use of alcohol, tobacco or illicit drugs  Current medications and supplements including opioid prescriptions. Patient is not currently taking opioid prescriptions. Functional ability and status Nutritional status Physical activity Advanced directives List of other physicians Hospitalizations, surgeries, and ER visits in previous 12 months Vitals Screenings to include cognitive, depression, and falls Referrals and appointments  In addition, I have reviewed and discussed with patient certain preventive protocols, quality metrics, and best practice recommendations. A written personalized care plan for preventive services as well as general preventive health recommendations were provided to patient.      Tora Kindred, CMA   09/16/2023   After Visit Summary: (MyChart) Due to this being a telephonic visit, the after visit summary with patients personalized plan was offered to patient via MyChart   Nurse Notes:  6 CIT Score - 2 Will bring documentation of covid booster received 09/15/23. Placed order for DEXA scan.

## 2023-09-16 NOTE — Patient Instructions (Addendum)
Barbara Fitzpatrick , Thank you for taking time to come for your Medicare Wellness Visit. I appreciate your ongoing commitment to your health goals. Please review the following plan we discussed and let me know if I can assist you in the future.   Referrals/Orders/Follow-Ups/Clinician Recommendations: Bring documentation to the office of the Covid vaccine that you recently received. I have placed an order for a bone density test. Call [facility] @ [phone] to schedule at your earliest convenience.  This is a list of the screening recommended for you and due dates:  Health Maintenance  Topic Date Due   DEXA scan (bone density measurement)  07/23/2023   COVID-19 Vaccine (6 - 2023-24 season) 08/24/2023   Flu Shot  03/22/2024*   Eye exam for diabetics  10/15/2023   Yearly kidney health urinalysis for diabetes  02/13/2024   Complete foot exam   02/13/2024   Hemoglobin A1C  02/13/2024   Mammogram  05/05/2024   Yearly kidney function blood test for diabetes  08/12/2024   Medicare Annual Wellness Visit  09/15/2024   Colon Cancer Screening  07/08/2025   DTaP/Tdap/Td vaccine (3 - Td or Tdap) 08/05/2032   Pneumonia Vaccine  Completed   Hepatitis C Screening  Completed   Zoster (Shingles) Vaccine  Completed   HPV Vaccine  Aged Out  *Topic was postponed. The date shown is not the original due date.    Advanced directives: (Copy Requested) Please bring a copy of your health care power of attorney and living will to the office to be added to your chart at your convenience.  Next Medicare Annual Wellness Visit scheduled for next year: Yes, 09/21/24 @ 12:45pm

## 2023-09-21 NOTE — Patient Instructions (Signed)
Be Involved in Caring For Your Health:  Taking Medications When medications are taken as directed, they can greatly improve your health. But if they are not taken as prescribed, they may not work. In some cases, not taking them correctly can be harmful. To help ensure your treatment remains effective and safe, understand your medications and how to take them. Bring your medications to each visit for review by your provider.  Your lab results, notes, and after visit summary will be available on My Chart. We strongly encourage you to use this feature. If lab results are abnormal the clinic will contact you with the appropriate steps. If the clinic does not contact you assume the results are satisfactory. You can always view your results on My Chart. If you have questions regarding your health or results, please contact the clinic during office hours. You can also ask questions on My Chart.  We at [facility] are grateful that you chose [place] to provide your care. We strive to provide evidence-based and compassionate care and are always looking for feedback. If you get a survey from the clinic please complete this so we can hear your opinions.  Insomnia Insomnia is a sleep disorder that makes it difficult to fall asleep or stay asleep. Insomnia can cause fatigue, low energy, difficulty concentrating, mood swings, and poor performance at work or school. There are three different ways to classify insomnia: Difficulty falling asleep. Difficulty staying asleep. Waking up too early in the morning. Any type of insomnia can be long-term (chronic) or short-term (acute). Both are common. Short-term insomnia usually lasts for 3 months or less. Chronic insomnia occurs at least three times a week for longer than 3 months. What are the causes? Insomnia may be caused by another condition, situation, or substance, such as: Having certain mental health conditions, such as anxiety and depression. Using  caffeine, alcohol, tobacco, or drugs. Having gastrointestinal conditions, such as gastroesophageal reflux disease (GERD). Having certain medical conditions. These include: Asthma. Alzheimer's disease. Stroke. Chronic pain. An overactive thyroid gland (hyperthyroidism). Other sleep disorders, such as restless legs syndrome and sleep apnea. Menopause. Sometimes, the cause of insomnia may not be known. What increases the risk? Risk factors for insomnia include: Gender. Females are affected more often than males. Age. Insomnia is more common as people get older. Stress and certain medical and mental health conditions. Lack of exercise. Having an irregular work schedule. This may include working night shifts and traveling between different time zones. What are the signs or symptoms? If you have insomnia, the main symptom is having trouble falling asleep or having trouble staying asleep. This may lead to other symptoms, such as: Feeling tired or having low energy. Feeling nervous about going to sleep. Not feeling rested in the morning. Having trouble concentrating. Feeling irritable, anxious, or depressed. How is this diagnosed? This condition may be diagnosed based on: Your symptoms and medical history. Your health care provider may ask about: Your sleep habits. Any medical conditions you have. Your mental health. A physical exam. How is this treated? Treatment for insomnia depends on the cause. Treatment may focus on treating an underlying condition that is causing the insomnia. Treatment may also include: Medicines to help you sleep. Counseling or therapy. Lifestyle adjustments to help you sleep better. Follow these instructions at home: Eating and drinking  Limit or avoid alcohol, caffeinated beverages, and products that contain nicotine and tobacco, especially close to bedtime. These can disrupt your sleep. Do not eat a large  meal or eat spicy foods right before bedtime. This  can lead to digestive discomfort that can make it hard for you to sleep. Sleep habits  Keep a sleep diary to help you and your health care provider figure out what could be causing your insomnia. Write down: When you sleep. When you wake up during the night. How well you sleep and how rested you feel the next day. Any side effects of medicines you are taking. What you eat and drink. Make your bedroom a dark, comfortable place where it is easy to fall asleep. Put up shades or blackout curtains to block light from outside. Use a white noise machine to block noise. Keep the temperature cool. Limit screen use before bedtime. This includes: Not watching TV. Not using your smartphone, tablet, or computer. Stick to a routine that includes going to bed and waking up at the same times every day and night. This can help you fall asleep faster. Consider making a quiet activity, such as reading, part of your nighttime routine. Try to avoid taking naps during the day so that you sleep better at night. Get out of bed if you are still awake after 15 minutes of trying to sleep. Keep the lights down, but try reading or doing a quiet activity. When you feel sleepy, go back to bed. General instructions Take over-the-counter and prescription medicines only as told by your health care provider. Exercise regularly as told by your health care provider. However, avoid exercising in the hours right before bedtime. Use relaxation techniques to manage stress. Ask your health care provider to suggest some techniques that may work well for you. These may include: Breathing exercises. Routines to release muscle tension. Visualizing peaceful scenes. Make sure that you drive carefully. Do not drive if you feel very sleepy. Keep all follow-up visits. This is important. Contact a health care provider if: You are tired throughout the day. You have trouble in your daily routine due to sleepiness. You continue to have  sleep problems, or your sleep problems get worse. Get help right away if: You have thoughts about hurting yourself or someone else. Get help right away if you feel like you may hurt yourself or others, or have thoughts about taking your own life. Go to your nearest emergency room or: Call 911. Call the National Suicide Prevention Lifeline at 660-758-8376 or 988. This is open 24 hours a day. Text the Crisis Text Line at 7874336497. Summary Insomnia is a sleep disorder that makes it difficult to fall asleep or stay asleep. Insomnia can be long-term (chronic) or short-term (acute). Treatment for insomnia depends on the cause. Treatment may focus on treating an underlying condition that is causing the insomnia. Keep a sleep diary to help you and your health care provider figure out what could be causing your insomnia. This information is not intended to replace advice given to you by your health care provider. Make sure you discuss any questions you have with your health care provider. Document Revised: 11/19/2021 Document Reviewed: 11/19/2021 Elsevier Patient Education  2024 [employer].

## 2023-09-24 DIAGNOSIS — J3089 Other allergic rhinitis: Secondary | ICD-10-CM | POA: Diagnosis not present

## 2023-09-25 ENCOUNTER — Ambulatory Visit: Payer: Medicare PPO | Admitting: Nurse Practitioner

## 2023-09-25 ENCOUNTER — Encounter: Payer: Self-pay | Admitting: Nurse Practitioner

## 2023-09-25 VITALS — BP 137/80 | HR 75 | Temp 97.8°F | Ht 64.0 in | Wt 201.6 lb

## 2023-09-25 DIAGNOSIS — F5101 Primary insomnia: Secondary | ICD-10-CM | POA: Diagnosis not present

## 2023-09-25 DIAGNOSIS — Z23 Encounter for immunization: Secondary | ICD-10-CM | POA: Diagnosis not present

## 2023-09-25 MED ORDER — TRAZODONE HCL 50 MG PO TABS: 25.0000 mg | ORAL_TABLET | Freq: Every evening | ORAL | 4 refills | Status: DC | PRN

## 2023-09-25 MED ORDER — CYCLOBENZAPRINE HCL 10 MG PO TABS: 10.0000 mg | ORAL_TABLET | Freq: Three times a day (TID) | ORAL | 1 refills | Status: AC | PRN

## 2023-09-25 NOTE — Assessment & Plan Note (Signed)
Chronic, improved.  Will continue Trazodone 25-50 MG PRN at night, refills sent in.  Educated her on this regimen and possible side effects to report.  Recommend: Sleep as long as necessary to feel rested (usually seven to eight hours for adults) and then get out of bed ?Maintain a regular sleep schedule, particularly a regular wake-up time in the morning ?Try not to force sleep ?Avoid caffeinated beverages after lunch ?Avoid alcohol near bedtime (eg, late afternoon and evening)  ?Avoid smoking or other nicotine intake, particularly during the evening ?Adjust the bedroom environment as needed to decrease stimuli (eg, reduce ambient light, turn off the television or radio) ?Avoid prolonged use of light-emitting screens (laptops, tablets, smartphones, ebooks) before bedtime  ?Resolve concerns or worries before bedtime ?Exercise regularly for at least 20 minutes, preferably more than four to five hours prior to bedtime  ?Avoid daytime naps, especially if they are longer than 20 to 30 minutes or occur late in the day

## 2023-09-25 NOTE — Progress Notes (Signed)
BP 137/80 (BP Location: Left Arm, Cuff Size: Normal)   Pulse 75   Temp 97.8 F (36.6 C) (Oral)   Ht 5\' 4"  (1.626 m)   Wt 201 lb 9.6 oz (91.4 kg)   LMP  (LMP Unknown)   SpO2 98%   BMI 34.60 kg/m    Subjective:    Patient ID: Barbara Fitzpatrick, female    DOB: 03/27/1951, 72 y.o.   MRN: [id number]  HPI: Barbara Fitzpatrick is a 72 y.o. female  Chief Complaint  Patient presents with   Insomnia    Patient says everything is going good and says she feels the medication is helping.    INSOMNIA Started Trazodone on 07/24/23, this is working well.  Took 1/2 a pill initially which did not help, but a whole pill was too much.  Currently 25 MG is offering benefit. Duration: chronic Satisfied with sleep quality: yes Difficulty falling asleep: no Difficulty staying asleep: no Waking a few hours after sleep onset: no Early morning awakenings: no Daytime hypersomnolence: no Wakes feeling refreshed: yes Good sleep hygiene: yes Apnea: no Snoring: no Depressed/anxious mood: no Recent stress: no Restless legs/nocturnal leg cramps: no Chronic pain/arthritis: no History of sleep study: yes uses CPAP Treatments attempted: Melatonin and sound machine  Relevant past medical, surgical, family and social history reviewed and updated as indicated. Interim medical history since our last visit reviewed. Allergies and medications reviewed and updated.  Review of Systems  Constitutional:  Negative for activity change, appetite change, diaphoresis, fatigue and fever.  Respiratory:  Negative for cough, chest tightness and shortness of breath.   Cardiovascular:  Negative for chest pain, palpitations and leg swelling.  Gastrointestinal: Negative.   Endocrine: Negative for polydipsia, polyphagia and polyuria.  Neurological: Negative.   Psychiatric/Behavioral: Negative.      Per HPI unless specifically indicated above     Objective:    BP 137/80 (BP Location: Left Arm, Cuff Size: Normal)   Pulse  75   Temp 97.8 F (36.6 C) (Oral)   Ht 5\' 4"  (1.626 m)   Wt 201 lb 9.6 oz (91.4 kg)   LMP  (LMP Unknown)   SpO2 98%   BMI 34.60 kg/m   Wt Readings from Last 3 Encounters:  09/25/23 201 lb 9.6 oz (91.4 kg)  09/16/23 197 lb (89.4 kg)  08/13/23 201 lb 9.6 oz (91.4 kg)    Physical Exam Vitals and nursing note reviewed.  Constitutional:      General: She is awake. She is not in acute distress.    Appearance: She is well-developed and well-groomed. She is obese. She is not ill-appearing or toxic-appearing.  HENT:     Head: Normocephalic.     Right Ear: Hearing and external ear normal.     Left Ear: Hearing and external ear normal.  Eyes:     General: Lids are normal.        Right eye: No discharge.        Left eye: No discharge.     Conjunctiva/sclera: Conjunctivae normal.     Pupils: Pupils are equal, round, and reactive to light.  Neck:     Thyroid: No thyromegaly.     Vascular: Carotid bruit (remain mild) present.  Cardiovascular:     Rate and Rhythm: Normal rate and regular rhythm.     Heart sounds: Normal heart sounds. No murmur heard.    No gallop.  Pulmonary:     Effort: Pulmonary effort is normal. No accessory muscle  usage or respiratory distress.     Breath sounds: Normal breath sounds.  Abdominal:     General: Bowel sounds are normal. There is no distension.     Palpations: Abdomen is soft.     Tenderness: There is no abdominal tenderness.  Musculoskeletal:     Cervical back: Normal range of motion and neck supple.     Right lower leg: No edema.     Left lower leg: No edema.  Lymphadenopathy:     Cervical: No cervical adenopathy.  Skin:    General: Skin is warm and dry.  Neurological:     Mental Status: She is alert and oriented to person, place, and time.     Deep Tendon Reflexes: Reflexes are normal and symmetric.     Reflex Scores:      Brachioradialis reflexes are 2+ on the right side and 2+ on the left side.      Patellar reflexes are 2+ on the right  side and 2+ on the left side. Psychiatric:        Attention and Perception: Attention normal.        Mood and Affect: Mood normal.        Speech: Speech normal.        Behavior: Behavior normal. Behavior is cooperative.        Thought Content: Thought content normal.     Results for orders placed or performed in visit on 08/13/23  Bayer DCA Hb A1c Waived  Result Value Ref Range   HB A1C (BAYER DCA - WAIVED) 5.7 (H) 4.8 - 5.6 %  Basic metabolic panel  Result Value Ref Range   Glucose 100 (H) 70 - 99 mg/dL   BUN 11 8 - 27 mg/dL   Creatinine, Ser 1.47 0.57 - 1.00 mg/dL   eGFR 81 >82 NF/AOZ/3.08   BUN/Creatinine Ratio 14 12 - 28   Sodium 143 134 - 144 mmol/L   Potassium 3.8 3.5 - 5.2 mmol/L   Chloride 102 96 - 106 mmol/L   CO2 26 20 - 29 mmol/L   Calcium 10.3 8.7 - 10.3 mg/dL  Lipid Panel w/o Chol/HDL Ratio  Result Value Ref Range   Cholesterol, Total 120 100 - 199 mg/dL   Triglycerides 657 (H) 0 - 149 mg/dL   HDL 42 >84 mg/dL   VLDL Cholesterol Cal 27 5 - 40 mg/dL   LDL Chol Calc (NIH) 51 0 - 99 mg/dL      Assessment & Plan:   Problem List Items Addressed This Visit       Other   Primary insomnia - Primary    Chronic, improved.  Will continue Trazodone 25-50 MG PRN at night, refills sent in.  Educated her on this regimen and possible side effects to report.  Recommend: Sleep as long as necessary to feel rested (usually seven to eight hours for adults) and then get out of bed ?Maintain a regular sleep schedule, particularly a regular wake-up time in the morning ?Try not to force sleep ?Avoid caffeinated beverages after lunch ?Avoid alcohol near bedtime (eg, late afternoon and evening)  ?Avoid smoking or other nicotine intake, particularly during the evening ?Adjust the bedroom environment as needed to decrease stimuli (eg, reduce ambient light, turn off the television or radio) ?Avoid prolonged use of light-emitting screens (laptops, tablets, smartphones, ebooks) before  bedtime  ?Resolve concerns or worries before bedtime ?Exercise regularly for at least 20 minutes, preferably more than four to five hours prior to bedtime  ?  Avoid daytime naps, especially if they are longer than 20 to 30 minutes or occur late in the day       Other Visit Diagnoses     Flu vaccine need       Flu vaccine due and provided to patient today, educated her on this.   Relevant Orders   Flu Vaccine Trivalent High Dose (Fluad) (Completed)        Follow up plan: Return for return as scheduled for physical 02/18/24.

## 2023-10-03 DIAGNOSIS — J301 Allergic rhinitis due to pollen: Secondary | ICD-10-CM | POA: Diagnosis not present

## 2023-10-03 DIAGNOSIS — J3089 Other allergic rhinitis: Secondary | ICD-10-CM | POA: Diagnosis not present

## 2023-10-03 DIAGNOSIS — J3081 Allergic rhinitis due to animal (cat) (dog) hair and dander: Secondary | ICD-10-CM | POA: Diagnosis not present

## 2023-10-07 ENCOUNTER — Other Ambulatory Visit: Payer: Self-pay | Admitting: Nurse Practitioner

## 2023-10-07 NOTE — Telephone Encounter (Signed)
Requested Prescriptions  Pending Prescriptions Disp Refills   atorvastatin (LIPITOR) 80 MG tablet [Pharmacy Med Name: ATORVASTATIN 80MG  TABLETS] 90 tablet 2    Sig: TAKE 1 TABLET(80 MG) BY MOUTH DAILY     Cardiovascular:  Antilipid - Statins Failed - 10/07/2023  7:07 AM      Failed - Lipid Panel in normal range within the last 12 months    Cholesterol, Total  Date Value Ref Range Status  08/13/2023 120 100 - 199 mg/dL Final   Cholesterol Piccolo, Waived  Date Value Ref Range Status  03/16/2020 172 <200 mg/dL Final    Comment:                            Desirable                <200                         Borderline High      200- 239                         High                     >239    LDL Chol Calc (NIH)  Date Value Ref Range Status  08/13/2023 51 0 - 99 mg/dL Final   HDL  Date Value Ref Range Status  08/13/2023 42 >39 mg/dL Final   Triglycerides  Date Value Ref Range Status  08/13/2023 163 (H) 0 - 149 mg/dL Final   Triglycerides Piccolo,Waived  Date Value Ref Range Status  03/16/2020 222 (H) <150 mg/dL Final    Comment:                            Normal                   <150                         Borderline High     150 - 199                         High                200 - 499                         Very High                >499          Passed - Patient is not pregnant      Passed - Valid encounter within last 12 months    Recent Outpatient Visits           1 week ago Primary insomnia   [place] [facility] [place], [place] T, NP   1 month ago Type 2 diabetes mellitus with morbid obesity (HCC)   [place] [facility] [place], [place] T, NP   7 months ago Type 2 diabetes mellitus with morbid obesity (HCC)   [place] [facility] [place], [place] T, NP   1 year ago Hypertension associated with type 2 diabetes mellitus [facility])    [facility] [place], [place] T,  NP   1 year ago Type 2  diabetes mellitus with morbid obesity (HCC)   [place] [facility] [place], Dorie Rank, NP       Future Appointments             In 4 months Cannady, Dorie Rank, NP [place] [facility], [facility]

## 2023-10-08 ENCOUNTER — Ambulatory Visit
Admission: RE | Admit: 2023-10-08 | Discharge: 2023-10-08 | Disposition: A | Discharge status: Home / Self Care | Payer: Medicare PPO | Source: Ambulatory Visit | Attending: Nurse Practitioner | Admitting: Nurse Practitioner

## 2023-10-08 DIAGNOSIS — Z780 Asymptomatic menopausal state: Secondary | ICD-10-CM | POA: Insufficient documentation

## 2023-10-08 DIAGNOSIS — M85851 Other specified disorders of bone density and structure, right thigh: Secondary | ICD-10-CM | POA: Diagnosis not present

## 2023-10-09 DIAGNOSIS — J301 Allergic rhinitis due to pollen: Secondary | ICD-10-CM | POA: Diagnosis not present

## 2023-10-09 DIAGNOSIS — J3089 Other allergic rhinitis: Secondary | ICD-10-CM | POA: Diagnosis not present

## 2023-10-09 DIAGNOSIS — J3081 Allergic rhinitis due to animal (cat) (dog) hair and dander: Secondary | ICD-10-CM | POA: Diagnosis not present

## 2023-10-09 NOTE — Progress Notes (Signed)
Contacted via MyChart   Your bone density shows ongoing thinning bones (osteopenia) but not brittle (osteoporosis). We recommend Vitamin D supplementation of about 2,0000 IUs of over the counter Vitamin D3. In addition, we recommend a diet high in calcium with dairy and dark green leafy vegetables. We would like you to get plenty of weight bearing exercises with walking and resistance training such as light weights or resistance bands available with instructions at places such as [employer].

## 2023-10-14 ENCOUNTER — Other Ambulatory Visit: Payer: Self-pay | Admitting: Nurse Practitioner

## 2023-10-15 ENCOUNTER — Encounter: Payer: Self-pay | Admitting: Nurse Practitioner

## 2023-10-15 NOTE — Telephone Encounter (Signed)
Requested Prescriptions  Pending Prescriptions Disp Refills   ezetimibe (ZETIA) 10 MG tablet [Pharmacy Med Name: EZETIMIBE 10MG  TABLETS] 90 tablet 3    Sig: TAKE 1 TABLET(10 MG) BY MOUTH DAILY     Cardiovascular:  Antilipid - Sterol Transport Inhibitors Failed - 10/14/2023  2:29 PM      Failed - Lipid Panel in normal range within the last 12 months    Cholesterol, Total  Date Value Ref Range Status  08/13/2023 120 100 - 199 mg/dL Final   Cholesterol Piccolo, Waived  Date Value Ref Range Status  03/16/2020 172 <200 mg/dL Final    Comment:                            Desirable                <200                         Borderline High      200- 239                         High                     >239    LDL Chol Calc (NIH)  Date Value Ref Range Status  08/13/2023 51 0 - 99 mg/dL Final   HDL  Date Value Ref Range Status  08/13/2023 42 >39 mg/dL Final   Triglycerides  Date Value Ref Range Status  08/13/2023 163 (H) 0 - 149 mg/dL Final   Triglycerides Piccolo,Waived  Date Value Ref Range Status  03/16/2020 222 (H) <150 mg/dL Final    Comment:                            Normal                   <150                         Borderline High     150 - 199                         High                200 - 499                         Very High                >499          Passed - AST in normal range and within 360 days    AST  Date Value Ref Range Status  02/12/2023 22 0 - 40 IU/L Final         Passed - ALT in normal range and within 360 days    ALT  Date Value Ref Range Status  02/12/2023 27 0 - 32 IU/L Final         Passed - Patient is not pregnant      Passed - Valid encounter within last 12 months    Recent Outpatient Visits           2 weeks ago Primary insomnia   [place] [facility] [place],  Jolene T, NP   2 months ago Type 2 diabetes mellitus with morbid obesity (HCC)   [place] [facility] [place], [place] T, NP   8  months ago Type 2 diabetes mellitus with morbid obesity (HCC)   [place] [facility] [place], [place] T, NP   1 year ago Hypertension associated with type 2 diabetes mellitus (HCC)   [place] [facility] [place], [name] T, NP   1 year ago Type 2 diabetes mellitus with morbid obesity (HCC)   [place] [facility] [place], Dorie Rank, NP       Future Appointments             In 4 months Cannady, Dorie Rank, NP [place] [facility], [facility]

## 2023-10-16 DIAGNOSIS — H524 Presbyopia: Secondary | ICD-10-CM | POA: Diagnosis not present

## 2023-10-16 DIAGNOSIS — E113292 Type 2 diabetes mellitus with mild nonproliferative diabetic retinopathy without macular edema, left eye: Secondary | ICD-10-CM | POA: Diagnosis not present

## 2023-10-16 DIAGNOSIS — J3089 Other allergic rhinitis: Secondary | ICD-10-CM | POA: Diagnosis not present

## 2023-10-16 DIAGNOSIS — J3081 Allergic rhinitis due to animal (cat) (dog) hair and dander: Secondary | ICD-10-CM | POA: Diagnosis not present

## 2023-10-16 DIAGNOSIS — H2513 Age-related nuclear cataract, bilateral: Secondary | ICD-10-CM | POA: Diagnosis not present

## 2023-10-16 DIAGNOSIS — H43813 Vitreous degeneration, bilateral: Secondary | ICD-10-CM | POA: Diagnosis not present

## 2023-10-16 DIAGNOSIS — J301 Allergic rhinitis due to pollen: Secondary | ICD-10-CM | POA: Diagnosis not present

## 2023-10-24 DIAGNOSIS — J3081 Allergic rhinitis due to animal (cat) (dog) hair and dander: Secondary | ICD-10-CM | POA: Diagnosis not present

## 2023-10-24 DIAGNOSIS — J3089 Other allergic rhinitis: Secondary | ICD-10-CM | POA: Diagnosis not present

## 2023-10-24 DIAGNOSIS — J301 Allergic rhinitis due to pollen: Secondary | ICD-10-CM | POA: Diagnosis not present

## 2023-11-07 DIAGNOSIS — J3081 Allergic rhinitis due to animal (cat) (dog) hair and dander: Secondary | ICD-10-CM | POA: Diagnosis not present

## 2023-11-07 DIAGNOSIS — J301 Allergic rhinitis due to pollen: Secondary | ICD-10-CM | POA: Diagnosis not present

## 2023-11-07 DIAGNOSIS — J3089 Other allergic rhinitis: Secondary | ICD-10-CM | POA: Diagnosis not present

## 2023-11-08 DIAGNOSIS — G4733 Obstructive sleep apnea (adult) (pediatric): Secondary | ICD-10-CM | POA: Diagnosis not present

## 2023-11-14 DIAGNOSIS — J3089 Other allergic rhinitis: Secondary | ICD-10-CM | POA: Diagnosis not present

## 2023-11-14 DIAGNOSIS — J301 Allergic rhinitis due to pollen: Secondary | ICD-10-CM | POA: Diagnosis not present

## 2023-11-14 DIAGNOSIS — J3081 Allergic rhinitis due to animal (cat) (dog) hair and dander: Secondary | ICD-10-CM | POA: Diagnosis not present

## 2023-11-18 DIAGNOSIS — J3081 Allergic rhinitis due to animal (cat) (dog) hair and dander: Secondary | ICD-10-CM | POA: Diagnosis not present

## 2023-11-18 DIAGNOSIS — J3089 Other allergic rhinitis: Secondary | ICD-10-CM | POA: Diagnosis not present

## 2023-11-18 DIAGNOSIS — J301 Allergic rhinitis due to pollen: Secondary | ICD-10-CM | POA: Diagnosis not present

## 2023-11-22 ENCOUNTER — Other Ambulatory Visit: Payer: Self-pay | Admitting: Nurse Practitioner

## 2023-11-24 ENCOUNTER — Other Ambulatory Visit: Payer: Self-pay

## 2023-11-24 DIAGNOSIS — E1169 Type 2 diabetes mellitus with other specified complication: Secondary | ICD-10-CM

## 2023-11-24 MED ORDER — ACCU-CHEK SOFTCLIX LANCETS MISC: 3 refills | Status: AC

## 2023-11-24 MED ORDER — ACCU-CHEK GUIDE TEST VI STRP: 1.0000 | ORAL_STRIP | Freq: Three times a day (TID) | 3 refills | Status: AC

## 2023-11-25 NOTE — Telephone Encounter (Signed)
Duplicate request- both filled yesterday-11/24/23 Requested Prescriptions  Pending Prescriptions Disp Refills   Accu-Chek Softclix Lancets lancets [Pharmacy Med Name: SOFTCLIX LANCETS] 300 each 4    Sig: TEST BLOOD SUGAR THREE TIMES DAILY AND DOCUMENT FOR VISITS. BRING LOG TO VISITS     Endocrinology: Diabetes - Testing Supplies Passed - 11/22/2023 10:00 AM      Passed - Valid encounter within last 12 months    Recent Outpatient Visits           2 months ago Primary insomnia   [place] [facility] [place], T[place] T, NP   3 months ago Type 2 diabetes mellitus with morbid obesity (HCC)   [place] [facility] [place], [place] T, NP   9 months ago Type 2 diabetes mellitus with morbid obesity (HCC)   [place] [facility] [place], [name] T, NP   1 year ago Hypertension associated with type 2 diabetes mellitus (HCC)   [place] [facility] [place], [name] T, NP   1 year ago Type 2 diabetes mellitus with morbid obesity (HCC)   [place] [facility] [place], Dorie Rank, NP       Future Appointments             In 2 months Cannady, Dorie Rank, NP [place] [facility], [facility]             ACCU-CHEK GUIDE TEST test strip [Pharmacy Med Name: ACCU-CHEK GUIDE TEST STRIPS 100S] 300 strip 4    Sig: USE AS DIRECTED THREE TIMES DAILY.     There is no refill protocol information for this order

## 2023-11-27 DIAGNOSIS — J3081 Allergic rhinitis due to animal (cat) (dog) hair and dander: Secondary | ICD-10-CM | POA: Diagnosis not present

## 2023-11-27 DIAGNOSIS — J3089 Other allergic rhinitis: Secondary | ICD-10-CM | POA: Diagnosis not present

## 2023-11-27 DIAGNOSIS — J301 Allergic rhinitis due to pollen: Secondary | ICD-10-CM | POA: Diagnosis not present

## 2023-12-05 DIAGNOSIS — J3081 Allergic rhinitis due to animal (cat) (dog) hair and dander: Secondary | ICD-10-CM | POA: Diagnosis not present

## 2023-12-05 DIAGNOSIS — J301 Allergic rhinitis due to pollen: Secondary | ICD-10-CM | POA: Diagnosis not present

## 2023-12-05 DIAGNOSIS — J3089 Other allergic rhinitis: Secondary | ICD-10-CM | POA: Diagnosis not present

## 2023-12-11 DIAGNOSIS — J301 Allergic rhinitis due to pollen: Secondary | ICD-10-CM | POA: Diagnosis not present

## 2023-12-11 DIAGNOSIS — J3081 Allergic rhinitis due to animal (cat) (dog) hair and dander: Secondary | ICD-10-CM | POA: Diagnosis not present

## 2023-12-11 DIAGNOSIS — J3089 Other allergic rhinitis: Secondary | ICD-10-CM | POA: Diagnosis not present

## 2023-12-26 DIAGNOSIS — J301 Allergic rhinitis due to pollen: Secondary | ICD-10-CM | POA: Diagnosis not present

## 2023-12-26 DIAGNOSIS — J3089 Other allergic rhinitis: Secondary | ICD-10-CM | POA: Diagnosis not present

## 2023-12-26 DIAGNOSIS — J3081 Allergic rhinitis due to animal (cat) (dog) hair and dander: Secondary | ICD-10-CM | POA: Diagnosis not present

## 2024-01-01 DIAGNOSIS — J3089 Other allergic rhinitis: Secondary | ICD-10-CM | POA: Diagnosis not present

## 2024-01-01 DIAGNOSIS — J301 Allergic rhinitis due to pollen: Secondary | ICD-10-CM | POA: Diagnosis not present

## 2024-01-01 DIAGNOSIS — J3081 Allergic rhinitis due to animal (cat) (dog) hair and dander: Secondary | ICD-10-CM | POA: Diagnosis not present

## 2024-01-09 DIAGNOSIS — J3081 Allergic rhinitis due to animal (cat) (dog) hair and dander: Secondary | ICD-10-CM | POA: Diagnosis not present

## 2024-01-09 DIAGNOSIS — J301 Allergic rhinitis due to pollen: Secondary | ICD-10-CM | POA: Diagnosis not present

## 2024-01-09 DIAGNOSIS — J3089 Other allergic rhinitis: Secondary | ICD-10-CM | POA: Diagnosis not present

## 2024-01-20 DIAGNOSIS — H1045 Other chronic allergic conjunctivitis: Secondary | ICD-10-CM | POA: Diagnosis not present

## 2024-01-20 DIAGNOSIS — J3089 Other allergic rhinitis: Secondary | ICD-10-CM | POA: Diagnosis not present

## 2024-01-30 DIAGNOSIS — J3089 Other allergic rhinitis: Secondary | ICD-10-CM | POA: Diagnosis not present

## 2024-01-30 DIAGNOSIS — J3081 Allergic rhinitis due to animal (cat) (dog) hair and dander: Secondary | ICD-10-CM | POA: Diagnosis not present

## 2024-01-30 DIAGNOSIS — J301 Allergic rhinitis due to pollen: Secondary | ICD-10-CM | POA: Diagnosis not present

## 2024-02-06 DIAGNOSIS — J301 Allergic rhinitis due to pollen: Secondary | ICD-10-CM | POA: Diagnosis not present

## 2024-02-06 DIAGNOSIS — J3081 Allergic rhinitis due to animal (cat) (dog) hair and dander: Secondary | ICD-10-CM | POA: Diagnosis not present

## 2024-02-06 DIAGNOSIS — J3089 Other allergic rhinitis: Secondary | ICD-10-CM | POA: Diagnosis not present

## 2024-02-08 DIAGNOSIS — G4733 Obstructive sleep apnea (adult) (pediatric): Secondary | ICD-10-CM | POA: Diagnosis not present

## 2024-02-15 DIAGNOSIS — E119 Type 2 diabetes mellitus without complications: Secondary | ICD-10-CM | POA: Insufficient documentation

## 2024-02-15 NOTE — Patient Instructions (Signed)
 Be Involved in Caring For Your Health:  Taking Medications When medications are taken as directed, they can greatly improve your health. But if they are not taken as prescribed, they may not work. In some cases, not taking them correctly can be harmful. To help ensure your treatment remains effective and safe, understand your medications and how to take them. Bring your medications to each visit for review by your provider.  Your lab results, notes, and after visit summary will be available on My Chart. We strongly encourage you to use this feature. If lab results are abnormal the clinic will contact you with the appropriate steps. If the clinic does not contact you assume the results are satisfactory. You can always view your results on My Chart. If you have questions regarding your health or results, please contact the clinic during office hours. You can also ask questions on My Chart.  We at [facility] are grateful that you chose [place] to provide your care. We strive to provide evidence-based and compassionate care and are always looking for feedback. If you get a survey from the clinic please complete this so we can hear your opinions.  Diabetes Mellitus and Foot Care Diabetes, also called diabetes mellitus, may cause problems with your feet and legs because of poor blood flow (circulation). Poor circulation may make your skin: Become thinner and drier. Break more easily. Heal more slowly. Peel and crack. You may also have nerve damage (neuropathy). This can cause decreased feeling in your legs and feet. This means that you may not notice minor injuries to your feet that could lead to more serious problems. Finding and treating problems early is the best way to prevent future foot problems. How to care for your feet Foot hygiene  Wash your feet daily with warm water and mild soap. Do not use hot water. Then, pat your feet and the areas between your toes until they are fully dry. Do  not soak your feet. This can dry your skin. Trim your toenails straight across. Do not dig under them or around the cuticle. File the edges of your nails with an emery board or nail file. Apply a moisturizing lotion or petroleum jelly to the skin on your feet and to dry, brittle toenails. Use lotion that does not contain alcohol and is unscented. Do not apply lotion between your toes. Shoes and socks Wear clean socks or stockings every day. Make sure they are not too tight. Do not wear knee-high stockings. These may decrease blood flow to your legs. Wear shoes that fit well and have enough cushioning. Always look in your shoes before you put them on to be sure there are no objects inside. To break in new shoes, wear them for just a few hours a day. This prevents injuries on your feet. Wounds, scrapes, corns, and calluses  Check your feet daily for blisters, cuts, bruises, sores, and redness. If you cannot see the bottom of your feet, use a mirror or ask someone for help. Do not cut off corns or calluses or try to remove them with medicine. If you find a minor scrape, cut, or break in the skin on your feet, keep it and the skin around it clean and dry. You may clean these areas with mild soap and water. Do not clean the area with peroxide, alcohol, or iodine. If you have a wound, scrape, corn, or callus on your foot, look at it several times a day to make sure it  is healing and not infected. Check for: Redness, swelling, or pain. Fluid or blood. Warmth. Pus or a bad smell. General tips Do not cross your legs. This may decrease blood flow to your feet. Do not use heating pads or hot water bottles on your feet. They may burn your skin. If you have lost feeling in your feet or legs, you may not know this is happening until it is too late. Protect your feet from hot and cold by wearing shoes, such as at the beach or on hot pavement. Schedule a complete foot exam at least once a year or more often if  you have foot problems. Report any cuts, sores, or bruises to your health care provider right away. Where to find more information American Diabetes Association: diabetes.org Association of Diabetes Care & Education Specialists: diabeteseducator.org Contact a health care provider if: You have a condition that increases your risk of infection, and you have any cuts, sores, or bruises on your feet. You have an injury that is not healing. You have redness on your legs or feet. You feel burning or tingling in your legs or feet. You have pain or cramps in your legs and feet. Your legs or feet are numb. Your feet always feel cold. You have pain around any toenails. Get help right away if: You have a wound, scrape, corn, or callus on your foot and: You have signs of infection. You have a fever. You have a red line going up your leg. This information is not intended to replace advice given to you by your health care provider. Make sure you discuss any questions you have with your health care provider. Document Revised: 06/12/2022 Document Reviewed: 06/12/2022 Elsevier Patient Education  2024 [employer].

## 2024-02-18 ENCOUNTER — Encounter: Payer: Self-pay | Admitting: Nurse Practitioner

## 2024-02-18 ENCOUNTER — Ambulatory Visit (INDEPENDENT_AMBULATORY_CARE_PROVIDER_SITE_OTHER): Payer: Self-pay | Admitting: Nurse Practitioner

## 2024-02-18 VITALS — BP 151/83 | HR 69 | Temp 97.9°F | Ht 65.1 in | Wt 200.2 lb

## 2024-02-18 DIAGNOSIS — E119 Type 2 diabetes mellitus without complications: Secondary | ICD-10-CM | POA: Diagnosis not present

## 2024-02-18 DIAGNOSIS — E785 Hyperlipidemia, unspecified: Secondary | ICD-10-CM

## 2024-02-18 DIAGNOSIS — I152 Hypertension secondary to endocrine disorders: Secondary | ICD-10-CM

## 2024-02-18 DIAGNOSIS — E6609 Other obesity due to excess calories: Secondary | ICD-10-CM

## 2024-02-18 DIAGNOSIS — Z7984 Long term (current) use of oral hypoglycemic drugs: Secondary | ICD-10-CM

## 2024-02-18 DIAGNOSIS — F322 Major depressive disorder, single episode, severe without psychotic features: Secondary | ICD-10-CM

## 2024-02-18 DIAGNOSIS — G4733 Obstructive sleep apnea (adult) (pediatric): Secondary | ICD-10-CM | POA: Diagnosis not present

## 2024-02-18 DIAGNOSIS — E66811 Obesity, class 1: Secondary | ICD-10-CM

## 2024-02-18 DIAGNOSIS — F5101 Primary insomnia: Secondary | ICD-10-CM | POA: Diagnosis not present

## 2024-02-18 DIAGNOSIS — E1169 Type 2 diabetes mellitus with other specified complication: Secondary | ICD-10-CM | POA: Diagnosis not present

## 2024-02-18 DIAGNOSIS — K219 Gastro-esophageal reflux disease without esophagitis: Secondary | ICD-10-CM

## 2024-02-18 DIAGNOSIS — R0989 Other specified symptoms and signs involving the circulatory and respiratory systems: Secondary | ICD-10-CM

## 2024-02-18 DIAGNOSIS — M8588 Other specified disorders of bone density and structure, other site: Secondary | ICD-10-CM | POA: Diagnosis not present

## 2024-02-18 DIAGNOSIS — E1159 Type 2 diabetes mellitus with other circulatory complications: Secondary | ICD-10-CM

## 2024-02-18 DIAGNOSIS — Z0000 Encounter for general adult medical examination without abnormal findings: Secondary | ICD-10-CM | POA: Diagnosis not present

## 2024-02-18 DIAGNOSIS — R234 Changes in skin texture: Secondary | ICD-10-CM

## 2024-02-18 LAB — BAYER DCA HB A1C WAIVED: HB A1C (BAYER DCA - WAIVED): 5.3 % (ref 4.8–5.6)

## 2024-02-18 LAB — MICROALBUMIN, URINE WAIVED
Creatinine, Urine Waived: 50 mg/dL (ref 10–300)
Microalb, Ur Waived: 10 mg/L (ref 0–19)

## 2024-02-18 MED ORDER — ACYCLOVIR 5 % EX OINT: 1.0000 | TOPICAL_OINTMENT | Freq: Three times a day (TID) | CUTANEOUS | 1 refills | Status: AC | PRN

## 2024-02-18 MED ORDER — BUPROPION HCL ER (XL) 300 MG PO TB24: ORAL_TABLET | ORAL | 4 refills | Status: AC

## 2024-02-18 MED ORDER — BENAZEPRIL-HYDROCHLOROTHIAZIDE 20-12.5 MG PO TABS: 2.0000 | ORAL_TABLET | Freq: Every day | ORAL | 4 refills | Status: AC

## 2024-02-18 MED ORDER — METFORMIN HCL 500 MG PO TABS
500.0000 mg | ORAL_TABLET | Freq: Two times a day (BID) | ORAL | 4 refills | Status: DC
Start: 2024-02-18 — End: 2024-08-17

## 2024-02-18 MED ORDER — ATORVASTATIN CALCIUM 80 MG PO TABS: ORAL_TABLET | ORAL | 4 refills | Status: AC

## 2024-02-18 MED ORDER — VALACYCLOVIR HCL 1 G PO TABS
ORAL_TABLET | ORAL | 3 refills | Status: AC
Start: 2024-02-18 — End: ?

## 2024-02-18 NOTE — Assessment & Plan Note (Signed)
 Chronic, improved.  Will continue Trazodone 25-50 MG PRN at night, refills sent in.  Educated her on this regimen and possible side effects to report.  Recommend: Sleep as long as necessary to feel rested (usually seven to eight hours for adults) and then get out of bed ?Maintain a regular sleep schedule, particularly a regular wake-up time in the morning ?Try not to force sleep ?Avoid caffeinated beverages after lunch ?Avoid alcohol near bedtime (eg, late afternoon and evening)  ?Avoid smoking or other nicotine intake, particularly during the evening ?Adjust the bedroom environment as needed to decrease stimuli (eg, reduce ambient light, turn off the television or radio) ?Avoid prolonged use of light-emitting screens (laptops, tablets, smartphones, ebooks) before bedtime  ?Resolve concerns or worries before bedtime ?Exercise regularly for at least 20 minutes, preferably more than four to five hours prior to bedtime  ?Avoid daytime naps, especially if they are longer than 20 to 30 minutes or occur late in the day

## 2024-02-18 NOTE — Assessment & Plan Note (Signed)
 Refer to diabetes with obesity plan of care.

## 2024-02-18 NOTE — Assessment & Plan Note (Signed)
 Chronic, ongoing with A1c 5.3% today and urine ALB 02 February 2024. Recommend continued heavy focus on diet and exercise regimen.  Continue Metformin 500 MG BID, but advised her if consistent BS <70 reduce to once a day.  Check BS three mornings a week and document.  Recheck A1c in 6 months. - Up to date on eye and foot exams - Vaccinations up to date - ACE and statin on board

## 2024-02-18 NOTE — Assessment & Plan Note (Signed)
 Ongoing and stable.  She remains asymptomatic and 2021 imaging by vascular noted no stenosis.  Discussed at length preventative measures, including goal for LDL <70 and BP <130/80. She is to alert provider if any symptoms present.  Will plan on returning to vascular if symptoms present or worsening bruits noted.  At this time continue preventative measures, including ASA and statin.  Plan for repeat imaging if worsening.

## 2024-02-18 NOTE — Assessment & Plan Note (Signed)
 Chronic, continue to use CPAP 100% of the time.  Continue to collaborate with pulmonary.

## 2024-02-18 NOTE — Assessment & Plan Note (Signed)
 Chronic, ongoing.  Continue current medication regimen, Wellbutrin XL 300 MG daily.  Denies SI/HI -- is aware to notify provider immediately if SI presents or plan.  No medication adjustment at this time.

## 2024-02-18 NOTE — Progress Notes (Signed)
 Contacted via MyChart   A1c still fabulous at 5.3%.  No changes needed.

## 2024-02-18 NOTE — Progress Notes (Signed)
 BP (!) 151/83 (BP Location: Left Arm, Cuff Size: Normal)   Pulse 69   Temp 97.9 F (36.6 C) (Oral)   Ht 5' 5.1" (1.654 m)   Wt 200 lb 3.2 oz (90.8 kg)   LMP  (LMP Unknown)   SpO2 97%   BMI 33.21 kg/m    Subjective:    Patient ID: Barbara Fitzpatrick, female    DOB: [birth year], 72 y.o.   MRN: [id number]  HPI: Barbara Fitzpatrick is a 73 y.o. female presenting on 02/18/2024 for comprehensive medical examination. She does not have any current medical complaints today.  She currently lives with: self -- spouse passed 25 years ago Menopausal Symptoms: no  Order diagnostic and ultrasound  In November started to have left breast changes, nipple went inwards, has not returned to normal.  No other skin or breast changes.  She has felt no masses.   DIABETES Diagnosed in August 2022 with A1c 7.7% .  A1c August 5.7%.  Continues Metformin 500 MG BID. Maintaining weight loss. Continues daily B12.   Hypoglycemic episodes:no Polydipsia/polyuria: no Visual disturbance: no Chest pain: no Paresthesias: no Glucose Monitoring: yes             Accucheck frequency: TID             Fasting glucose: 77 to 149 via her log at different times             Post prandial:              Evening:              Before meals: Taking Insulin?: no             Long acting insulin:             Short acting insulin: Blood Pressure Monitoring: not checking Retinal Examination: Up to Date -- Dr. Walker Kehr at [facility] Foot Exam: Up to Date Pneumovax: Up to Date Influenza: Up to Date Aspirin: no    HYPERTENSION/HLD Taking Lotensin & Lipitor + Zetia. Does have CPAP and uses 100% of the time.   Takes occasional Prilosec for heart burn -- needs almost every day. Hypertension status: controlled  Satisfied with current treatment? yes Duration of hypertension: chronic BP monitoring frequency: a few times a week BP range: 127/82 to 150/90 -- HR in 60-80 range BP medication side effects:  no Medication compliance:  good compliance Previous BP meds: none Aspirin: yes Recurrent headaches: when BP is up notices headaches Visual changes: no Palpitations: no Dyspnea: no Chest pain: no Lower extremity edema: occasional if on legs for awhile Dizzy/lightheaded: no   OSTEOPENIA Remains on imaging 2024. No recent falls or fractures. Satisfied with current treatment?: yes Adequate calcium & vitamin D: yes Weight bearing exercises: yes    DEPRESSION Taking Wellbutrin XL daily. Mood status: controlled Satisfied with current treatment?: yes Symptom severity: mild  Duration of current treatment : chronic Side effects: no Medication compliance: excellent compliance Psychotherapy/counseling: no never gone Previous psychiatric medications:none Depressed mood: occasional Anxious mood: occasional Anhedonia: no Significant weight loss or gain: no Insomnia: occasionally Fatigue: no Feelings of worthlessness or guilt: no Impaired concentration/indecisiveness: no Suicidal ideations: no Hopelessness: no Crying spells: no    02/18/2024    8:44 AM 09/25/2023   11:03 AM 09/16/2023    1:45 PM 08/13/2023    9:40 AM 02/12/2023    9:47 AM  Depression screen PHQ 2/9  Decreased Interest 1 0 0 1 1  Down,  Depressed, Hopeless 1 1 0 1 1  PHQ - 2 Score 2 1 0 2 2  Altered sleeping 2 2 0 1 1  Tired, decreased energy 1 1 0 1 2  Change in appetite 2 2 0 1 2  Feeling bad or failure about yourself  0 0 0 0 0  Trouble concentrating 0 0 0 0 0  Moving slowly or fidgety/restless 0 0 0 0 0  Suicidal thoughts 0 0 0 0 0  PHQ-9 Score 7 6 0 5 7  Difficult doing work/chores Somewhat difficult Somewhat difficult Not difficult at all Somewhat difficult        02/18/2024    8:45 AM 09/25/2023   11:04 AM 08/13/2023    9:40 AM 02/12/2023    9:48 AM  GAD 7 : Generalized Anxiety Score  Nervous, Anxious, on Edge 1 2 1 1   Control/stop worrying 2 2 1 1   Worry too much - different things 1 2 1 1   Trouble relaxing 1 3 2 1   Restless  1 1 1  0  Easily annoyed or irritable 0 0 0 1  Afraid - awful might happen 0 0 0 0  Total GAD 7 Score 6 10 6 5   Anxiety Difficulty Somewhat difficult Not difficult at all Not difficult at all Somewhat difficult      02/12/2023    9:47 AM 08/13/2023    9:39 AM 09/16/2023    1:48 PM 09/25/2023   11:03 AM 02/18/2024    8:44 AM  Fall Risk  Falls in the past year? 0 0 0 0 0  Was there an injury with Fall? 0 0 0 0 0  Fall Risk Category Calculator 0 0 0 0 0  Patient at Risk for Falls Due to No Fall Risks No Fall Risks No Fall Risks No Fall Risks No Fall Risks  Fall risk Follow up Falls evaluation completed Falls evaluation completed Falls prevention discussed Falls evaluation completed Falls evaluation completed    Functional Status Survey: Is the patient deaf or have difficulty hearing?: No Does the patient have difficulty seeing, even when wearing glasses/contacts?: No Does the patient have difficulty concentrating, remembering, or making decisions?: No Does the patient have difficulty walking or climbing stairs?: No Does the patient have difficulty dressing or bathing?: No Does the patient have difficulty doing errands alone such as visiting a doctor's office or shopping?: No    Past Medical History:  Past Medical History:  Diagnosis Date   Allergy    Arthritis    Depression    Diabetes (HCC)    GERD (gastroesophageal reflux disease)    Hyperlipidemia    Hypertension    Personal history of colonic adenomas 06/16/2013   4 adenomas (max 1 cm)  04/2003  no polyps 04/2005    Sleep apnea    uses c-pap    Surgical History:  Past Surgical History:  Procedure Laterality Date   ABDOMINAL HYSTERECTOMY     COLONOSCOPY     hysterectomy     with bladder tack   POLYPECTOMY     TUBAL LIGATION      Medications:  Current Outpatient Medications on File Prior to Visit  Medication Sig   ACCU-CHEK GUIDE test strip TEST THREE TIMES DAILY   Accu-Chek Softclix Lancets lancets USE TO CHECK  BLOOD SUGAR THREE TIMES DAILY AND DOCUMENT FOR VISITS. BRING LOG TO VISITS   aspirin 81 MG tablet Take 81 mg by mouth daily.   cetirizine (ZYRTEC) 10 MG tablet  Take 10 mg by mouth daily.   Cholecalciferol (VITAMIN D-3 PO) Take 3,000 Units by mouth daily.    Coenzyme Q10 (CO Q-10 PO) Take by mouth daily.   cyclobenzaprine (FLEXERIL) 10 MG tablet Take 1 tablet (10 mg total) by mouth 3 (three) times daily as needed for muscle spasms.   EPIPEN 2-PAK 0.3 MG/0.3ML SOAJ injection as needed.    ezetimibe (ZETIA) 10 MG tablet TAKE 1 TABLET(10 MG) BY MOUTH DAILY   glucosamine-chondroitin 500-400 MG tablet Take 1 tablet by mouth 2 (two) times daily.   glucose blood (ACCU-CHEK GUIDE TEST) test strip 1 each by Other route 3 (three) times daily. Use as instructed   ipratropium (ATROVENT) 0.06 % nasal spray Place into both nostrils.   Magnesium 250 MG TABS Take by mouth daily.    Multiple Vitamins-Minerals (MULTIVITAMIN PO) Take by mouth daily.   Olopatadine HCl 0.6 % SOLN Place 2 sprays into both nostrils 2 (two) times daily.   omeprazole (PRILOSEC) 20 MG capsule Take 20 mg by mouth daily.   traZODone (DESYREL) 50 MG tablet Take 0.5-1 tablets (25-50 mg total) by mouth at bedtime as needed for sleep.   vitamin B-12 (CYANOCOBALAMIN) 1000 MCG tablet Take 1,000 mcg by mouth daily.   vitamin E 1000 UNIT capsule Take 1,000 Units by mouth daily.   No current facility-administered medications on file prior to visit.    Allergies:  No Known Allergies  Social History:  Social History   Socioeconomic History   Marital status: Widowed    Spouse name: Not on file   Number of children: 2   Years of education: Not on file   Highest education level: 12th grade  Occupational History   Occupation: retired    Comment: asst. Air traffic controller for [employer]  Tobacco Use   Smoking status: Never   Smokeless tobacco: Never  Vaping Use   Vaping status: Never Used  Substance and Sexual Activity   Alcohol use: No   Drug  use: No   Sexual activity: Not Currently  Other Topics Concern   Not on file  Social History Narrative   Widowed, 2 daughters (twins)   Social Drivers of Health   Financial Resource Strain: Low Risk  (09/16/2023)   Overall Financial Resource Strain (CARDIA)    Difficulty of Paying Living Expenses: Not hard at all  Food Insecurity: No Food Insecurity (09/16/2023)   Hunger Vital Sign    Worried About Running Out of Food in the Last Year: Never true    Ran Out of Food in the Last Year: Never true  Transportation Needs: No Transportation Needs (09/16/2023)   PRAPARE - Administrator, Civil Service (Medical): No    Lack of Transportation (Non-Medical): No  Physical Activity: Insufficiently Active (09/16/2023)   Exercise Vital Sign    Days of Exercise per Week: 2 days    Minutes of Exercise per Session: 30 min  Stress: No Stress Concern Present (09/16/2023)   [employer] of Occupational Health - Occupational Stress Questionnaire    Feeling of Stress : Not at all  Social Connections: Moderately Isolated (09/16/2023)   Social Connection and Isolation Panel [NHANES]    Frequency of Communication with Friends and Family: More than three times a week    Frequency of Social Gatherings with Friends and Family: More than three times a week    Attends Religious Services: More than 4 times per year    Active Member of Clubs or Organizations: No  Attends Banker Meetings: Never    Marital Status: Widowed  Intimate Partner Violence: Not At Risk (09/16/2023)   Humiliation, Afraid, Rape, and Kick questionnaire    Fear of Current or Ex-Partner: No    Emotionally Abused: No    Physically Abused: No    Sexually Abused: No   Social History   Tobacco Use  Smoking Status Never  Smokeless Tobacco Never   Social History   Substance and Sexual Activity  Alcohol Use No    Family History:  Family History  Problem Relation Age of Onset   Hypertension Mother     Hyperlipidemia Mother    Diabetes Mother    Cancer Mother        uterine or ovarian cancer   Glaucoma Mother    Heart disease Father    Hypertension Father    Hyperlipidemia Father    Diabetes Father    Diabetes Sister    Lung disease Sister        polyps   Cancer Brother        kidney- on dialysis   Glaucoma Brother    Hyperlipidemia Brother    Hypertension Brother    Cancer Maternal Aunt 50       ovarian   Stroke Maternal Grandfather    Diabetes Paternal Grandmother    Heart disease Paternal Grandfather    Esophageal cancer Neg Hx    Rectal cancer Neg Hx    Stomach cancer Neg Hx    Breast cancer Neg Hx    Colon polyps Neg Hx     Past medical history, surgical history, medications, allergies, family history and social history reviewed with patient today and changes made to appropriate areas of the chart.   ROS  All other ROS negative except what is listed above and in the HPI.      Objective:    BP (!) 151/83 (BP Location: Left Arm, Cuff Size: Normal)   Pulse 69   Temp 97.9 F (36.6 C) (Oral)   Ht 5' 5.1" (1.654 m)   Wt 200 lb 3.2 oz (90.8 kg)   LMP  (LMP Unknown)   SpO2 97%   BMI 33.21 kg/m   Wt Readings from Last 3 Encounters:  02/18/24 200 lb 3.2 oz (90.8 kg)  09/25/23 201 lb 9.6 oz (91.4 kg)  09/16/23 197 lb (89.4 kg)    Physical Exam Vitals and nursing note reviewed. Exam conducted with a chaperone present.  Constitutional:      General: She is awake. She is not in acute distress.    Appearance: She is well-developed and well-groomed. She is obese. She is not ill-appearing or toxic-appearing.  HENT:     Head: Normocephalic and atraumatic.     Right Ear: Hearing, tympanic membrane, ear canal and external ear normal. No drainage.     Left Ear: Hearing, tympanic membrane, ear canal and external ear normal. No drainage.     Nose: Nose normal.     Right Sinus: No maxillary sinus tenderness or frontal sinus tenderness.     Left Sinus: No maxillary sinus  tenderness or frontal sinus tenderness.     Mouth/Throat:     Mouth: Mucous membranes are moist.     Pharynx: Oropharynx is clear. Uvula midline. No pharyngeal swelling, oropharyngeal exudate or posterior oropharyngeal erythema.  Eyes:     General: Lids are normal.        Right eye: No discharge.        Left  eye: No discharge.     Extraocular Movements: Extraocular movements intact.     Conjunctiva/sclera: Conjunctivae normal.     Pupils: Pupils are equal, round, and reactive to light.     Visual Fields: Right eye visual fields normal and left eye visual fields normal.  Neck:     Thyroid: No thyromegaly.     Vascular: Carotid bruit (R>L) present.     Trachea: Trachea normal.  Cardiovascular:     Rate and Rhythm: Normal rate and regular rhythm.     Heart sounds: Normal heart sounds. No murmur heard.    No gallop.  Pulmonary:     Effort: Pulmonary effort is normal. No accessory muscle usage or respiratory distress.     Breath sounds: Normal breath sounds.  Chest:  Breasts:    Right: Normal.     Left: Inverted nipple present. No swelling, mass, nipple discharge, skin change or tenderness.  Abdominal:     General: Bowel sounds are normal.     Palpations: Abdomen is soft. There is no hepatomegaly or splenomegaly.     Tenderness: There is no abdominal tenderness.  Musculoskeletal:        General: Normal range of motion.     Cervical back: Normal range of motion and neck supple.     Right lower leg: No edema.     Left lower leg: No edema.  Lymphadenopathy:     Head:     Right side of head: No submental, submandibular, tonsillar, preauricular or posterior auricular adenopathy.     Left side of head: No submental, submandibular, tonsillar, preauricular or posterior auricular adenopathy.     Cervical: No cervical adenopathy.     Upper Body:     Right upper body: No supraclavicular, axillary or pectoral adenopathy.     Left upper body: No supraclavicular, axillary or pectoral  adenopathy.  Skin:    General: Skin is warm and dry.     Capillary Refill: Capillary refill takes less than 2 seconds.     Findings: No rash.  Neurological:     Mental Status: She is alert and oriented to person, place, and time.     Gait: Gait is intact.     Deep Tendon Reflexes: Reflexes are normal and symmetric.     Reflex Scores:      Brachioradialis reflexes are 2+ on the right side and 2+ on the left side.      Patellar reflexes are 2+ on the right side and 2+ on the left side. Psychiatric:        Attention and Perception: Attention normal.        Mood and Affect: Mood normal.        Speech: Speech normal.        Behavior: Behavior normal. Behavior is cooperative.        Thought Content: Thought content normal.        Judgment: Judgment normal.    Diabetic Foot Exam - Simple   Simple Foot Form Visual Inspection See comments: Yes Sensation Testing See comments: Yes Pulse Check Posterior Tibialis and Dorsalis pulse intact bilaterally: Yes Comments Dry skin to both feet.  Sensation right 8/10 and left 10/10.     Results for orders placed or performed in visit on 02/18/24  Bayer DCA Hb A1c Waived   Collection Time: 02/18/24  8:43 AM  Result Value Ref Range   HB A1C (BAYER DCA - WAIVED) 5.3 4.8 - 5.6 %  Microalbumin, Urine Waived  Collection Time: 02/18/24  8:43 AM  Result Value Ref Range   Microalb, Ur Waived 10 0 - 19 mg/L   Creatinine, Urine Waived 50 10 - 300 mg/dL   Microalb/Creat Ratio 30-300 (H) <30 mg/g      Assessment & Plan:   Problem List Items Addressed This Visit       Cardiovascular and Mediastinum   Hypertension associated with type 2 diabetes mellitus (HCC)   Chronic, stable.  BP above goal in office today on initial and recheck.  Recommend she monitor BP at least a few mornings a week at home and document.  DASH diet at home.  Increase Benazepril-HCTZ to 20 MG-12.5 MG two tablets daily to = 40 MG and 25 MG.  Labs: CBC, TSH, CMP, urine ALB.   Return in 4 weeks.  Educated patient at length on change.      Relevant Medications   benazepril-hydrochlorthiazide (LOTENSIN HCT) 20-12.5 MG tablet   atorvastatin (LIPITOR) 80 MG tablet   metFORMIN (GLUCOPHAGE) 500 MG tablet   Other Relevant Orders   Bayer DCA Hb A1c Waived (Completed)   Microalbumin, Urine Waived (Completed)   CBC with Differential/Platelet   Comprehensive metabolic panel   TSH     Respiratory   OSA on CPAP   Chronic, continue to use CPAP 100% of the time.  Continue to collaborate with pulmonary.        Digestive   GERD (gastroesophageal reflux disease)   Relevant Orders   Magnesium     Endocrine   Diabetes mellitus treated with oral medication (HCC)   Refer to diabetes with obesity plan of care.      Relevant Medications   benazepril-hydrochlorthiazide (LOTENSIN HCT) 20-12.5 MG tablet   atorvastatin (LIPITOR) 80 MG tablet   metFORMIN (GLUCOPHAGE) 500 MG tablet   Other Relevant Orders   Bayer DCA Hb A1c Waived (Completed)   Microalbumin, Urine Waived (Completed)   Hyperlipidemia associated with type 2 diabetes mellitus (HCC)   Chronic, stable.  Continue current medication regimen and adjust as needed.  Lipid panel today.      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 Recommend continued heavy focus on diet and exercise regimen.  Continue Metformin 500 MG BID, but advised her if consistent BS <70 reduce to once a day.  Check BS three mornings a week and document.  Recheck A1c in 6 months. - Up to date on eye and foot exams - Vaccinations up to date - ACE and statin on board       Relevant Medications   benazepril-hydrochlorthiazide (LOTENSIN HCT) 20-12.5 MG tablet   atorvastatin (LIPITOR) 80 MG tablet   metFORMIN (GLUCOPHAGE) 500 MG tablet     Musculoskeletal and Integument   Osteopenia of spine   Chronic, continue daily supplements.  DEXA repeat in 2029.  Vitamin D level today.      Relevant Orders   VITAMIN D 25 Hydroxy (Vit-D Deficiency, Fractures)     Other   Bilateral carotid bruits   Ongoing and stable.  She remains asymptomatic and 2021 imaging by vascular noted no stenosis.  Discussed at  length preventative measures, including goal for LDL <70 and BP <130/80. She is to alert provider if any symptoms present.  Will plan on returning to vascular if symptoms present or worsening bruits noted.  At this time continue preventative measures, including ASA and statin.  Plan for repeat imaging if worsening.      Depression, major, single episode, severe (HCC)   Chronic, ongoing.  Continue current medication regimen, Wellbutrin XL 300 MG daily.  Denies SI/HI -- is aware to notify provider immediately if SI presents or plan.  No medication adjustment at this time.        Relevant Medications   buPROPion (WELLBUTRIN XL) 300 MG 24 hr tablet   Obesity   BMI 33.21, maintaining loss.  Recommended eating smaller high protein, low fat meals more frequently and exercising 30 mins a day 5 times a week with a goal of 10-15lb weight loss in the next 3 months. Patient voiced their understanding and motivation to adhere to these recommendations.       Relevant Medications   metFORMIN (GLUCOPHAGE) 500 MG tablet   Primary insomnia   Chronic, improved.  Will continue Trazodone 25-50 MG PRN at night, refills sent in.  Educated her on this regimen and possible side effects to report.  Recommend: Sleep as long as necessary to feel rested (usually seven to eight hours for adults) and then get out of bed ?Maintain a regular sleep schedule, particularly a regular wake-up time in  the morning ?Try not to force sleep ?Avoid caffeinated beverages after lunch ?Avoid alcohol near bedtime (eg, late afternoon and evening)  ?Avoid smoking or other nicotine intake, particularly during the evening ?Adjust the bedroom environment as needed to decrease stimuli (eg, reduce ambient light, turn off the television or radio) ?Avoid prolonged use of light-emitting screens (laptops, tablets, smartphones, ebooks) before bedtime  ?Resolve concerns or worries before bedtime ?Exercise regularly for at least 20 minutes, preferably more than four to five hours prior to bedtime  ?Avoid daytime naps, especially if they are longer than 20 to 30 minutes or occur late in the day       Other Visit Diagnoses       Breast skin changes       Left nipple has inverted, new onset. Order for diagnostic imaging and ultrasound.  Discussed with patient.   Relevant Orders   MM Digital Diagnostic Bilat   [place] LIMITED ULTRASOUND INCLUDING AXILLA LEFT BREAST      Encounter for annual physical exam       Annual physical today with labs and health maintenance reviewed, discussed with patient.        Follow up plan: Return in about 4 weeks (around 03/17/2024) for HTN -- increase BP medication.   LABORATORY TESTING:  - Pap smear: not applicable  IMMUNIZATIONS:   - Tetanus vaccination status reviewed: last tetanus booster within 10 years. - Influenza: Up to date - Pneumovax: Up to date - Prevnar: Up to date - COVID: Up to date - HPV: Not applicable - Shingrix vaccine: Up to date  SCREENING: -Mammogram: Up to date  - Colonoscopy: Up To Date - Bone Density:Up To Date 10/07/2028 -Hearing Test:  no applicable   -Spirometry: Not applicable   PATIENT COUNSELING:    Diet: Encouraged to adjust caloric intake to maintain or achieve ideal body weight, to reduce intake of dietary saturated fat and total fat, to limit sodium intake by avoiding high sodium foods and not adding table salt, and to maintain  adequate dietary potassium and calcium preferably from fresh fruits, vegetables, and low-fat dairy products.    Stressed the importance of regular exercise  Injury prevention: Discussed safety belts, safety helmets, smoke detector, smoking near bedding or upholstery.   Dental health: Discussed importance of regular tooth brushing, flossing, and dental visits.    NEXT PREVENTATIVE PHYSICAL DUE IN 1 YEAR. Return in about 4 weeks (around 03/17/2024) for HTN -- increase BP medication.

## 2024-02-18 NOTE — Assessment & Plan Note (Signed)
 Chronic, stable.  Continue current medication regimen and adjust as needed.  Lipid panel today.

## 2024-02-18 NOTE — Assessment & Plan Note (Signed)
 Chronic, stable.  BP above goal in office today on initial and recheck.  Recommend she monitor BP at least a few mornings a week at home and document.  DASH diet at home.  Increase Benazepril-HCTZ to 20 MG-12.5 MG two tablets daily to = 40 MG and 25 MG.  Labs: CBC, TSH, CMP, urine ALB.  Return in 4 weeks.  Educated patient at length on change.

## 2024-02-18 NOTE — Assessment & Plan Note (Signed)
 Chronic, continue daily supplements.  DEXA repeat in 2029.  Vitamin D level today.

## 2024-02-18 NOTE — Assessment & Plan Note (Addendum)
 BMI 33.21, maintaining loss.  Recommended eating smaller high protein, low fat meals more frequently and exercising 30 mins a day 5 times a week with a goal of 10-15lb weight loss in the next 3 months. Patient voiced their understanding and motivation to adhere to these recommendations.

## 2024-02-19 DIAGNOSIS — J3089 Other allergic rhinitis: Secondary | ICD-10-CM | POA: Diagnosis not present

## 2024-02-19 LAB — COMPREHENSIVE METABOLIC PANEL
ALT: 21 [IU]/L (ref 0–32)
AST: 19 [IU]/L (ref 0–40)
Albumin: 4.1 g/dL (ref 3.8–4.8)
Alkaline Phosphatase: 84 [IU]/L (ref 44–121)
BUN/Creatinine Ratio: 18 (ref 12–28)
BUN: 14 mg/dL (ref 8–27)
Bilirubin Total: 0.4 mg/dL (ref 0.0–1.2)
CO2: 26 mmol/L (ref 20–29)
Calcium: 9.9 mg/dL (ref 8.7–10.3)
Chloride: 100 mmol/L (ref 96–106)
Creatinine, Ser: 0.76 mg/dL (ref 0.57–1.00)
Globulin, Total: 1.7 g/dL (ref 1.5–4.5)
Glucose: 98 mg/dL (ref 70–99)
Potassium: 3.7 mmol/L (ref 3.5–5.2)
Sodium: 140 mmol/L (ref 134–144)
Total Protein: 5.8 g/dL — ABNORMAL LOW (ref 6.0–8.5)
eGFR: 84 mL/min/{1.73_m2} (ref >59)

## 2024-02-19 LAB — CBC WITH DIFFERENTIAL/PLATELET
Basophils Absolute: 0 10*3/uL (ref 0.0–0.2)
Basos: 0 %
EOS (ABSOLUTE): 0.1 10*3/uL (ref 0.0–0.4)
Eos: 1 %
Hematocrit: 41.3 % (ref 34.0–46.6)
Hemoglobin: 13.9 g/dL (ref 11.1–15.9)
Immature Grans (Abs): 0 10*3/uL (ref 0.0–0.1)
Immature Granulocytes: 0 %
Lymphocytes Absolute: 2.4 10*3/uL (ref 0.7–3.1)
Lymphs: 30 %
MCH: 31.1 pg (ref 26.6–33.0)
MCHC: 33.7 g/dL (ref 31.5–35.7)
MCV: 92 fL (ref 79–97)
Monocytes Absolute: 0.5 10*3/uL (ref 0.1–0.9)
Monocytes: 6 %
Neutrophils Absolute: 5.1 10*3/uL (ref 1.4–7.0)
Neutrophils: 63 %
Platelets: 267 10*3/uL (ref 150–450)
RBC: 4.47 x10E6/uL (ref 3.77–5.28)
RDW: 12.1 % (ref 11.7–15.4)
WBC: 8.1 10*3/uL (ref 3.4–10.8)

## 2024-02-19 LAB — LIPID PANEL W/O CHOL/HDL RATIO
Cholesterol, Total: 107 mg/dL (ref 100–199)
HDL: 39 mg/dL — ABNORMAL LOW (ref >39)
LDL Chol Calc (NIH): 47 mg/dL (ref 0–99)
Triglycerides: 112 mg/dL (ref 0–149)
VLDL Cholesterol Cal: 21 mg/dL (ref 5–40)

## 2024-02-19 LAB — TSH: TSH: 3.08 u[IU]/mL (ref 0.450–4.500)

## 2024-02-19 LAB — VITAMIN D 25 HYDROXY (VIT D DEFICIENCY, FRACTURES): Vit D, 25-Hydroxy: 51.4 ng/mL (ref 30.0–100.0)

## 2024-02-19 LAB — MAGNESIUM: Magnesium: 1.9 mg/dL (ref 1.6–2.3)

## 2024-02-19 NOTE — Progress Notes (Signed)
 Contacted via MyChart   Good afternoon Barbara Fitzpatrick, your labs have returned and overall are stable.  No medication changes needed.  You are doing amazing!!  Keep it up!! Keep being stellar!!  Thank you for allowing me to participate in your care.  I appreciate you. Kindest regards, Edina Winningham

## 2024-02-24 ENCOUNTER — Telehealth: Payer: Self-pay

## 2024-02-24 NOTE — Telephone Encounter (Signed)
 PA for Acyclovir ointment initiated and submitted via Cover My Meds. Key: [id number]

## 2024-02-26 ENCOUNTER — Ambulatory Visit
Admission: RE | Admit: 2024-02-26 | Discharge: 2024-02-26 | Disposition: A | Discharge status: Home / Self Care | Payer: Medicare PPO | Source: Ambulatory Visit | Attending: Nurse Practitioner | Admitting: Nurse Practitioner

## 2024-02-26 ENCOUNTER — Other Ambulatory Visit: Payer: Self-pay | Admitting: Nurse Practitioner

## 2024-02-26 DIAGNOSIS — R234 Changes in skin texture: Secondary | ICD-10-CM

## 2024-02-26 DIAGNOSIS — I152 Hypertension secondary to endocrine disorders: Secondary | ICD-10-CM

## 2024-02-26 DIAGNOSIS — E669 Obesity, unspecified: Secondary | ICD-10-CM

## 2024-02-26 DIAGNOSIS — G4733 Obstructive sleep apnea (adult) (pediatric): Secondary | ICD-10-CM

## 2024-02-26 DIAGNOSIS — R0989 Other specified symptoms and signs involving the circulatory and respiratory systems: Secondary | ICD-10-CM

## 2024-02-26 DIAGNOSIS — E1159 Type 2 diabetes mellitus with other circulatory complications: Secondary | ICD-10-CM

## 2024-02-26 DIAGNOSIS — F5101 Primary insomnia: Secondary | ICD-10-CM

## 2024-02-26 DIAGNOSIS — K219 Gastro-esophageal reflux disease without esophagitis: Secondary | ICD-10-CM

## 2024-02-26 DIAGNOSIS — M8588 Other specified disorders of bone density and structure, other site: Secondary | ICD-10-CM

## 2024-02-26 DIAGNOSIS — N6459 Other signs and symptoms in breast: Secondary | ICD-10-CM | POA: Diagnosis present

## 2024-02-26 DIAGNOSIS — Z0000 Encounter for general adult medical examination without abnormal findings: Secondary | ICD-10-CM

## 2024-02-26 DIAGNOSIS — E66811 Obesity, class 1: Secondary | ICD-10-CM

## 2024-02-26 DIAGNOSIS — E119 Type 2 diabetes mellitus without complications: Secondary | ICD-10-CM

## 2024-02-26 DIAGNOSIS — E785 Hyperlipidemia, unspecified: Secondary | ICD-10-CM

## 2024-02-26 DIAGNOSIS — F322 Major depressive disorder, single episode, severe without psychotic features: Secondary | ICD-10-CM

## 2024-03-14 NOTE — Patient Instructions (Signed)
 Be Involved in Caring For Your Health:  Taking Medications When medications are taken as directed, they can greatly improve your health. But if they are not taken as prescribed, they may not work. In some cases, not taking them correctly can be harmful. To help ensure your treatment remains effective and safe, understand your medications and how to take them. Bring your medications to each visit for review by your provider.  Your lab results, notes, and after visit summary will be available on My Chart. We strongly encourage you to use this feature. If lab results are abnormal the clinic will contact you with the appropriate steps. If the clinic does not contact you assume the results are satisfactory. You can always view your results on My Chart. If you have questions regarding your health or results, please contact the clinic during office hours. You can also ask questions on My Chart.  We at [facility] are grateful that you chose [place] to provide your care. We strive to provide evidence-based and compassionate care and are always looking for feedback. If you get a survey from the clinic please complete this so we can hear your opinions.  DASH Eating Plan DASH stands for Dietary Approaches to Stop Hypertension. The DASH eating plan is a healthy eating plan that has been shown to: Lower high blood pressure (hypertension). Reduce your risk for type 2 diabetes, heart disease, and stroke. Help with weight loss. What are tips for following this plan? Reading food labels Check food labels for the amount of salt (sodium) per serving. Choose foods with less than 5 percent of the Daily Value (DV) of sodium. In general, foods with less than 300 milligrams (mg) of sodium per serving fit into this eating plan. To find whole grains, look for the word "whole" as the first word in the ingredient list. Shopping Buy products labeled as "low-sodium" or "no salt added." Buy fresh foods. Avoid canned  foods and pre-made or frozen meals. Cooking Try not to add salt when you cook. Use salt-free seasonings or herbs instead of table salt or sea salt. Check with your health care provider or pharmacist before using salt substitutes. Do not fry foods. Cook foods in healthy ways, such as baking, boiling, grilling, roasting, or broiling. Cook using oils that are good for your heart. These include olive, canola, avocado, soybean, and sunflower oil. Meal planning  Eat a balanced diet. This should include: 4 or more servings of fruits and 4 or more servings of vegetables each day. Try to fill half of your plate with fruits and vegetables. 6-8 servings of whole grains each day. 6 or less servings of lean meat, poultry, or fish each day. 1 oz is 1 serving. A 3 oz (85 g) serving of meat is about the same size as the palm of your hand. One egg is 1 oz (28 g). 2-3 servings of low-fat dairy each day. One serving is 1 cup (237 mL). 1 serving of nuts, seeds, or beans 5 times each week. 2-3 servings of heart-healthy fats. Healthy fats called omega-3 fatty acids are found in foods such as walnuts, flaxseeds, fortified milks, and eggs. These fats are also found in cold-water fish, such as sardines, salmon, and mackerel. Limit how much you eat of: Canned or prepackaged foods. Food that is high in trans fat, such as fried foods. Food that is high in saturated fat, such as fatty meat. Desserts and other sweets, sugary drinks, and other foods with added sugar. Full-fat  dairy products. Do not salt foods before eating. Do not eat more than 4 egg yolks a week. Try to eat at least 2 vegetarian meals a week. Eat more home-cooked food and less restaurant, buffet, and fast food. Lifestyle When eating at a restaurant, ask if your food can be made with less salt or no salt. If you drink alcohol: Limit how much you have to: 0-1 drink a day if you are female. 0-2 drinks a day if you are female. Know how much alcohol is in  your drink. In the [place] one drink is one 12 oz bottle of beer (355 mL), one 5 oz glass of wine (148 mL), or one 1 oz glass of hard liquor (44 mL). General information Avoid eating more than 2,300 mg of salt a day. If you have hypertension, you may need to reduce your sodium intake to 1,500 mg a day. Work with your provider to stay at a healthy body weight or lose weight. Ask what the best weight range is for you. On most days of the week, get at least 30 minutes of exercise that causes your heart to beat faster. This may include walking, swimming, or biking. Work with your provider or dietitian to adjust your eating plan to meet your specific calorie needs. What foods should I eat? Fruits All fresh, dried, or frozen fruit. Canned fruits that are in their natural juice and do not have sugar added to them. Vegetables Fresh or frozen vegetables that are raw, steamed, roasted, or grilled. Low-sodium or reduced-sodium tomato and vegetable juice. Low-sodium or reduced-sodium tomato sauce and tomato paste. Low-sodium or reduced-sodium canned vegetables. Grains Whole-grain or whole-wheat bread. Whole-grain or whole-wheat pasta. Brown rice. Orpah Cobb. Bulgur. Whole-grain and low-sodium cereals. Pita bread. Low-fat, low-sodium crackers. Whole-wheat flour tortillas. Meats and other proteins Skinless chicken or [place]. Ground chicken or [place]. Pork with fat trimmed off. Fish and seafood. Egg whites. Dried beans, peas, or lentils. Unsalted nuts, nut butters, and seeds. Unsalted canned beans. Lean cuts of beef with fat trimmed off. Low-sodium, lean precooked or cured meat, such as sausages or meat loaves. Dairy Low-fat (1%) or fat-free (skim) milk. Reduced-fat, low-fat, or fat-free cheeses. Nonfat, low-sodium ricotta or cottage cheese. Low-fat or nonfat yogurt. Low-fat, low-sodium cheese. Fats and oils Soft margarine without trans fats. Vegetable oil. Reduced-fat, low-fat, or light mayonnaise and salad  dressings (reduced-sodium). Canola, safflower, olive, avocado, soybean, and sunflower oils. Avocado. Seasonings and condiments Herbs. Spices. Seasoning mixes without salt. Other foods Unsalted popcorn and pretzels. Fat-free sweets. The items listed above may not be all the foods and drinks you can have. Talk to a dietitian to learn more. What foods should I avoid? Fruits Canned fruit in a light or heavy syrup. Fried fruit. Fruit in cream or butter sauce. Vegetables Creamed or fried vegetables. Vegetables in a cheese sauce. Regular canned vegetables that are not marked as low-sodium or reduced-sodium. Regular canned tomato sauce and paste that are not marked as low-sodium or reduced-sodium. Regular tomato and vegetable juices that are not marked as low-sodium or reduced-sodium. Rosita Fire. Olives. Grains Baked goods made with fat, such as croissants, muffins, or some breads. Dry pasta or rice meal packs. Meats and other proteins Fatty cuts of meat. Ribs. Fried meat. Tomasa Blase. Bologna, salami, and other precooked or cured meats, such as sausages or meat loaves, that are not lean and low in sodium. Fat from the back of a pig (fatback). Bratwurst. Salted nuts and seeds. Canned beans with added salt. Canned  or smoked fish. Whole eggs or egg yolks. Chicken or [place] with skin. Dairy Whole or 2% milk, cream, and half-and-half. Whole or full-fat cream cheese. Whole-fat or sweetened yogurt. Full-fat cheese. Nondairy creamers. Whipped toppings. Processed cheese and cheese spreads. Fats and oils Butter. Stick margarine. Lard. Shortening. Ghee. Bacon fat. Tropical oils, such as coconut, palm kernel, or palm oil. Seasonings and condiments Onion salt, garlic salt, seasoned salt, table salt, and sea salt. Worcestershire sauce. Tartar sauce. Barbecue sauce. Teriyaki sauce. Soy sauce, including reduced-sodium soy sauce. Steak sauce. Canned and packaged gravies. Fish sauce. Oyster sauce. Cocktail sauce. Store-bought  horseradish. Ketchup. Mustard. Meat flavorings and tenderizers. Bouillon cubes. Hot sauces. Pre-made or packaged marinades. Pre-made or packaged taco seasonings. Relishes. Regular salad dressings. Other foods Salted popcorn and pretzels. The items listed above may not be all the foods and drinks you should avoid. Talk to a dietitian to learn more. Where to find more information National Heart, Lung, and Blood Institute (NHLBI): BuffaloDryCleaner.gl American Heart Association (AHA): heart.org Academy of Nutrition and Dietetics: eatright.org National Kidney Foundation (NKF): kidney.org This information is not intended to replace advice given to you by your health care provider. Make sure you discuss any questions you have with your health care provider. Document Revised: 12/26/2022 Document Reviewed: 12/26/2022 Elsevier Patient Education  2024 [employer].

## 2024-03-17 ENCOUNTER — Encounter: Payer: Self-pay | Admitting: Nurse Practitioner

## 2024-03-17 ENCOUNTER — Ambulatory Visit: Payer: Medicare PPO | Admitting: Nurse Practitioner

## 2024-03-17 VITALS — BP 127/75 | HR 68 | Temp 97.9°F | Ht 65.0 in | Wt 199.8 lb

## 2024-03-17 DIAGNOSIS — E6609 Other obesity due to excess calories: Secondary | ICD-10-CM | POA: Diagnosis not present

## 2024-03-17 DIAGNOSIS — E66811 Obesity, class 1: Secondary | ICD-10-CM | POA: Diagnosis not present

## 2024-03-17 DIAGNOSIS — Z6833 Body mass index (BMI) 33.0-33.9, adult: Secondary | ICD-10-CM

## 2024-03-17 DIAGNOSIS — E1159 Type 2 diabetes mellitus with other circulatory complications: Secondary | ICD-10-CM | POA: Diagnosis not present

## 2024-03-17 DIAGNOSIS — I152 Hypertension secondary to endocrine disorders: Secondary | ICD-10-CM

## 2024-03-17 NOTE — Assessment & Plan Note (Signed)
 BMI 33.25, maintaining loss.  Recommended eating smaller high protein, low fat meals more frequently and exercising 30 mins a day 5 times a week with a goal of 10-15lb weight loss in the next 3 months. Patient voiced their understanding and motivation to adhere to these recommendations.

## 2024-03-17 NOTE — Assessment & Plan Note (Signed)
 Chronic, stable.  BP improving on checks at home and in office.  Recommend she monitor BP at least a few mornings a week at home and document.  DASH diet at home.  Continue Benazepril-HCTZ to 20 MG-12.5 MG two tablets daily = 40 MG and 25 MG total daily.  Labs: BMP.  If electrolytes are abnormal may need to adjust regimen, could consider reducing back down to one tablet and then adding on Amlodipine.

## 2024-03-17 NOTE — Progress Notes (Signed)
 BP 127/75 (BP Location: Left Arm, Cuff Size: Large)   Pulse 68   Temp 97.9 F (36.6 C) (Oral)   Ht 5\' 5"  (1.651 m)   Wt 199 lb 12.8 oz (90.6 kg)   LMP  (LMP Unknown)   SpO2 98%   BMI 33.25 kg/m    Subjective:    Patient ID: Barbara Fitzpatrick, female    DOB: 1951-09-13, 73 y.o.   MRN: [id number]  HPI: Barbara Fitzpatrick is a 73 y.o. 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  At visit on 02/18/24 increased her Benazepril-HCTZ to 20-12.5 MG two tablets daily (daily dose 40-25 MG).  With medication change she does notice her legs have felt more heavy and occasional cramping. Hypertension status: stable  Satisfied with current treatment? yes Duration of hypertension: chronic BP monitoring frequency:  daily BP range: 108/70 to 143/89 -- on average <130/80 BP medication side effects:  no Medication compliance: good compliance Aspirin: yes Recurrent headaches: no Visual changes: no Palpitations: no Dyspnea: no Chest pain: no Lower extremity edema: occasional Dizzy/lightheaded: no   Relevant past medical, surgical, family and social history reviewed and updated as indicated. Interim medical history since our last visit reviewed. Allergies and medications reviewed and updated.  Review of Systems  Constitutional:  Negative for activity change, appetite change, diaphoresis, fatigue and fever.  Respiratory:  Negative for cough, chest tightness, shortness of breath and wheezing.   Cardiovascular:  Negative for chest pain, palpitations and leg swelling.  Gastrointestinal: Negative.   Neurological: Negative.   Psychiatric/Behavioral: Negative.      Per HPI unless specifically indicated above     Objective:    BP 127/75 (BP Location: Left Arm, Cuff Size: Large)   Pulse 68   Temp 97.9 F (36.6 C) (Oral)   Ht 5\' 5"  (1.651 m)   Wt 199 lb 12.8 oz (90.6 kg)   LMP  (LMP Unknown)    SpO2 98%   BMI 33.25 kg/m   Wt Readings from Last 3 Encounters:  03/17/24 199 lb 12.8 oz (90.6 kg)  02/18/24 200 lb 3.2 oz (90.8 kg)  09/25/23 201 lb 9.6 oz (91.4 kg)    Physical Exam Vitals and nursing note reviewed.  Constitutional:      General: She is awake. She is not in acute distress.    Appearance: She is well-developed and well-groomed. She is obese. She is not ill-appearing or toxic-appearing.  HENT:     Head: Normocephalic.     Right Ear: Hearing and external ear normal.     Left Ear: Hearing and external ear normal.  Eyes:     General: Lids are normal.        Right eye: No discharge.        Left eye: No discharge.     Conjunctiva/sclera: Conjunctivae normal.     Pupils: Pupils are equal, round, and reactive to light.  Neck:     Thyroid: No thyromegaly.     Vascular: No carotid bruit.  Cardiovascular:     Rate and Rhythm: Normal rate and regular rhythm.     Heart sounds: Normal heart sounds. No murmur heard.    No gallop.  Pulmonary:     Effort: Pulmonary effort is normal. No accessory muscle usage or respiratory distress.     Breath sounds: Normal breath sounds.  Abdominal:     General: Bowel sounds are normal.  There is no distension.     Palpations: Abdomen is soft.     Tenderness: There is no abdominal tenderness.  Musculoskeletal:     Cervical back: Normal range of motion and neck supple.     Right lower leg: No edema.     Left lower leg: No edema.  Lymphadenopathy:     Cervical: No cervical adenopathy.  Skin:    General: Skin is warm and dry.  Neurological:     Mental Status: She is alert and oriented to person, place, and time.     Deep Tendon Reflexes: Reflexes are normal and symmetric.     Reflex Scores:      Brachioradialis reflexes are 2+ on the right side and 2+ on the left side.      Patellar reflexes are 2+ on the right side and 2+ on the left side. Psychiatric:        Attention and Perception: Attention normal.        Mood and Affect: Mood  normal.        Speech: Speech normal.        Behavior: Behavior normal. Behavior is cooperative.        Thought Content: Thought content normal.     Results for orders placed or performed in visit on 02/18/24  Bayer DCA Hb A1c Waived   Collection Time: 02/18/24  8:43 AM  Result Value Ref Range   HB A1C (BAYER DCA - WAIVED) 5.3 4.8 - 5.6 %  Microalbumin, Urine Waived   Collection Time: 02/18/24  8:43 AM  Result Value Ref Range   Microalb, Ur Waived 10 0 - 19 mg/L   Creatinine, Urine Waived 50 10 - 300 mg/dL   Microalb/Creat Ratio 30-300 (H) <30 mg/g  CBC with Differential/Platelet   Collection Time: 02/18/24  8:44 AM  Result Value Ref Range   WBC 8.1 3.4 - 10.8 x10E3/uL   RBC 4.47 3.77 - 5.28 x10E6/uL   Hemoglobin 13.9 11.1 - 15.9 g/dL   Hematocrit 21.3 08.6 - 46.6 %   MCV 92 79 - 97 fL   MCH 31.1 26.6 - 33.0 pg   MCHC 33.7 31.5 - 35.7 g/dL   RDW 57.8 46.9 - 62.9 %   Platelets 267 150 - 450 x10E3/uL   Neutrophils 63 Not Estab. %   Lymphs 30 Not Estab. %   Monocytes 6 Not Estab. %   Eos 1 Not Estab. %   Basos 0 Not Estab. %   Neutrophils Absolute 5.1 1.4 - 7.0 x10E3/uL   Lymphocytes Absolute 2.4 0.7 - 3.1 x10E3/uL   Monocytes Absolute 0.5 0.1 - 0.9 x10E3/uL   EOS (ABSOLUTE) 0.1 0.0 - 0.4 x10E3/uL   Basophils Absolute 0.0 0.0 - 0.2 x10E3/uL   Immature Granulocytes 0 Not Estab. %   Immature Grans (Abs) 0.0 0.0 - 0.1 x10E3/uL  Comprehensive metabolic panel   Collection Time: 02/18/24  8:44 AM  Result Value Ref Range   Glucose 98 70 - 99 mg/dL   BUN 14 8 - 27 mg/dL   Creatinine, Ser 5.28 0.57 - 1.00 mg/dL   eGFR 84 >41 LK/GMW/1.02   BUN/Creatinine Ratio 18 12 - 28   Sodium 140 134 - 144 mmol/L   Potassium 3.7 3.5 - 5.2 mmol/L   Chloride 100 96 - 106 mmol/L   CO2 26 20 - 29 mmol/L   Calcium 9.9 8.7 - 10.3 mg/dL   Total Protein 5.8 (L) 6.0 - 8.5 g/dL   Albumin 4.1 3.8 -  4.8 g/dL   Globulin, Total 1.7 1.5 - 4.5 g/dL   Bilirubin Total 0.4 0.0 - 1.2 mg/dL   Alkaline  Phosphatase 84 44 - 121 IU/L   AST 19 0 - 40 IU/L   ALT 21 0 - 32 IU/L  Lipid Panel w/o Chol/HDL Ratio   Collection Time: 02/18/24  8:44 AM  Result Value Ref Range   Cholesterol, Total 107 100 - 199 mg/dL   Triglycerides 161 0 - 149 mg/dL   HDL 39 (L) >09 mg/dL   VLDL Cholesterol Cal 21 5 - 40 mg/dL   LDL Chol Calc (NIH) 47 0 - 99 mg/dL  TSH   Collection Time: 02/18/24  8:44 AM  Result Value Ref Range   TSH 3.080 0.450 - 4.500 uIU/mL  VITAMIN D 25 Hydroxy (Vit-D Deficiency, Fractures)   Collection Time: 02/18/24  8:44 AM  Result Value Ref Range   Vit D, 25-Hydroxy 51.4 30.0 - 100.0 ng/mL  Magnesium   Collection Time: 02/18/24  8:44 AM  Result Value Ref Range   Magnesium 1.9 1.6 - 2.3 mg/dL      Assessment & Plan:   Problem List Items Addressed This Visit       Cardiovascular and Mediastinum   Hypertension associated with type 2 diabetes mellitus (HCC) - Primary   Chronic, stable.  BP improving on checks at home and in office.  Recommend she monitor BP at least a few mornings a week at home and document.  DASH diet at home.  Continue Benazepril-HCTZ to 20 MG-12.5 MG two tablets daily = 40 MG and 25 MG total daily.  Labs: BMP.  If electrolytes are abnormal may need to adjust regimen, could consider reducing back down to one tablet and then adding on Amlodipine.      Relevant Orders   Basic metabolic panel     Other   Obesity   BMI 33.25, maintaining loss.  Recommended eating smaller high protein, low fat meals more frequently and exercising 30 mins a day 5 times a week with a goal of 10-15lb weight loss in the next 3 months. Patient voiced their understanding and motivation to adhere to these recommendations.         Follow up plan: Return in about 5 months (around 08/17/2024) for T2DM, HTN/HLD.

## 2024-03-18 ENCOUNTER — Encounter: Payer: Self-pay | Admitting: Nurse Practitioner

## 2024-03-18 LAB — BASIC METABOLIC PANEL WITH GFR
BUN/Creatinine Ratio: 18 (ref 12–28)
BUN: 13 mg/dL (ref 8–27)
CO2: 26 mmol/L (ref 20–29)
Calcium: 9.9 mg/dL (ref 8.7–10.3)
Chloride: 102 mmol/L (ref 96–106)
Creatinine, Ser: 0.74 mg/dL (ref 0.57–1.00)
Glucose: 110 mg/dL — ABNORMAL HIGH (ref 70–99)
Potassium: 3.8 mmol/L (ref 3.5–5.2)
Sodium: 142 mmol/L (ref 134–144)
eGFR: 86 mL/min/{1.73_m2} (ref >59)

## 2024-03-18 NOTE — Progress Notes (Signed)
 Contacted via MyChart   Good evening Barbara Fitzpatrick, your labs have returned and overall are stable.  No changes needed.  Any questions? Keep being stellar!!  Thank you for allowing me to participate in your care.  I appreciate you. Kindest regards, [name]

## 2024-04-09 ENCOUNTER — Other Ambulatory Visit: Payer: Self-pay | Admitting: Nurse Practitioner

## 2024-04-09 NOTE — Telephone Encounter (Signed)
 Too soon for refill, last 02/18/24 for 90 and 4 refills.  Requested Prescriptions  Pending Prescriptions Disp Refills   benazepril -hydrochlorthiazide (LOTENSIN  HCT) 20-12.5 MG tablet [Pharmacy Med Name: BENAZEPRIL /HCTZ 20/12.5MG  TABLETS] 90 tablet     Sig: TAKE 1 TABLET BY MOUTH DAILY     Cardiovascular:  ACEI + Diuretic Combos Passed - 04/09/2024  1:44 PM      Passed - Na in normal range and within 180 days    Sodium  Date Value Ref Range Status  03/17/2024 142 134 - 144 mmol/L Final         Passed - K in normal range and within 180 days    Potassium  Date Value Ref Range Status  03/17/2024 3.8 3.5 - 5.2 mmol/L Final         Passed - Cr in normal range and within 180 days    Creatinine, Ser  Date Value Ref Range Status  03/17/2024 0.74 0.57 - 1.00 mg/dL Final         Passed - eGFR is 30 or above and within 180 days    GFR calc Af Amer  Date Value Ref Range Status  01/25/2021 77 >59 mL/min/1.73 Final    Comment:    **In accordance with recommendations from the NKF-ASN Task force,**   [facility] is in the process of updating its eGFR calculation to the   2021 CKD-EPI creatinine equation that estimates kidney function   without a race variable.    GFR calc non Af Amer  Date Value Ref Range Status  01/25/2021 67 >59 mL/min/1.73 Final   eGFR  Date Value Ref Range Status  03/17/2024 86 >59 mL/min/1.73 Final         Passed - Patient is not pregnant      Passed - Last BP in normal range    BP Readings from Last 1 Encounters:  03/17/24 127/75         Passed - Valid encounter within last 6 months    Recent Outpatient Visits           3 weeks ago Hypertension associated with type 2 diabetes mellitus (HCC)   [facility] Fitz[facility], Barbara T, NP   1 month ago Type 2 diabetes mellitus with obesity (HCC)    [facility] [place], Melanie DASEN, NP

## 2024-06-02 ENCOUNTER — Other Ambulatory Visit: Payer: Self-pay | Admitting: Nurse Practitioner

## 2024-06-02 DIAGNOSIS — Z1231 Encounter for screening mammogram for malignant neoplasm of breast: Secondary | ICD-10-CM

## 2024-06-17 ENCOUNTER — Ambulatory Visit
Admission: RE | Admit: 2024-06-17 | Discharge: 2024-06-17 | Disposition: A | Discharge status: Home / Self Care | Source: Ambulatory Visit | Attending: Nurse Practitioner | Admitting: Nurse Practitioner

## 2024-06-17 DIAGNOSIS — Z1231 Encounter for screening mammogram for malignant neoplasm of breast: Secondary | ICD-10-CM | POA: Diagnosis present

## 2024-06-21 ENCOUNTER — Ambulatory Visit: Payer: Self-pay | Admitting: Nurse Practitioner

## 2024-06-21 NOTE — Progress Notes (Signed)
 Contacted via MyChart   Normal mammogram, may repeat in one year:)

## 2024-08-06 NOTE — Progress Notes (Signed)
 [facility] [address] [place], [place] [address]  Pulmonary Sleep Medicine   Office Visit Note  Patient Name: Barbara Fitzpatrick DOB: 1951/04/20 MRN [id number]    Chief Complaint: Obstructive Sleep Apnea visit  Brief History:  Barbara Fitzpatrick is seen today for an annual follow up visit for CPAP@ 7 cmH2O. The patient has a 8 year history of sleep apnea. Patient is using PAP nightly.  The patient feels rested after sleeping with PAP.  The patient reports benefiting from PAP use. Reported sleepiness is  improved and the Epworth Sleepiness Score is 1 out of 24. The patient does not take naps. The patient complains of the following: occasional headaches and fatigue.  The compliance download shows  99% compliance with an average use time of 8 hours 46 minutes. The AHI is 0.8.  The patient does complain of limb movements disrupting sleep. The patient continues to require PAP therapy in order  to eliminate sleep apnea.  The patient complains of rumination, and restlessness that causes her not to be able to lie down to go to sleep for a prolonged period at night. In the past, I had prescribed her some medication for restless legs, but she didn't ever try it.  ROS  General: (-) fever, (-) chills, (-) night sweat Nose and Sinuses: (-) nasal stuffiness or itchiness, (-) postnasal drip, (-) nosebleeds, (-) sinus trouble. Mouth and Throat: (-) sore throat, (-) hoarseness. Neck: (-) swollen glands, (-) enlarged thyroid, (-) neck pain. Respiratory: - cough, - shortness of breath, - wheezing. Neurologic: - numbness, - tingling. Psychiatric: - anxiety, - depression   Current Medication: Outpatient Encounter Medications as of 08/09/2024  Medication Sig   ACCU-CHEK GUIDE test strip TEST THREE TIMES DAILY   Accu-Chek Softclix Lancets lancets USE TO CHECK BLOOD SUGAR THREE TIMES DAILY AND DOCUMENT FOR VISITS. BRING LOG TO VISITS   acyclovir ointment (ZOVIRAX) 5 % Apply 1 Application topically 3  (three) times daily as needed.   aspirin 81 MG tablet Take 81 mg by mouth daily.   atorvastatin (LIPITOR) 80 MG tablet TAKE 1 TABLET(80 MG) BY MOUTH DAILY   benazepril-hydrochlorthiazide (LOTENSIN HCT) 20-12.5 MG tablet Take 2 tablets by mouth daily.   buPROPion (WELLBUTRIN XL) 300 MG 24 hr tablet TAKE 1 TABLET(300 MG) BY MOUTH DAILY   cetirizine (ZYRTEC) 10 MG tablet Take 10 mg by mouth daily.   Cholecalciferol (VITAMIN D-3 PO) Take 3,000 Units by mouth daily.    Coenzyme Q10 (CO Q-10 PO) Take by mouth daily.   cyclobenzaprine (FLEXERIL) 10 MG tablet Take 1 tablet (10 mg total) by mouth 3 (three) times daily as needed for muscle spasms.   EPIPEN 2-PAK 0.3 MG/0.3ML SOAJ injection as needed.    ezetimibe (ZETIA) 10 MG tablet TAKE 1 TABLET(10 MG) BY MOUTH DAILY   glucosamine-chondroitin 500-400 MG tablet Take 1 tablet by mouth 2 (two) times daily.   glucose blood (ACCU-CHEK GUIDE TEST) test strip 1 each by Other route 3 (three) times daily. Use as instructed   ipratropium (ATROVENT) 0.06 % nasal spray Place into both nostrils.   Magnesium 250 MG TABS Take by mouth daily.    metFORMIN (GLUCOPHAGE) 500 MG tablet Take 1 tablet (500 mg total) by mouth 2 (two) times daily with a meal.   Multiple Vitamins-Minerals (MULTIVITAMIN PO) Take by mouth daily.   Olopatadine HCl 0.6 % SOLN Place 2 sprays into both nostrils 2 (two) times daily.   omeprazole (PRILOSEC) 20 MG capsule Take 20 mg by  mouth daily.   traZODone (DESYREL) 50 MG tablet Take 0.5-1 tablets (25-50 mg total) by mouth at bedtime as needed for sleep.   valACYclovir (VALTREX) 1000 MG tablet Take one tablet twice daily at the first sign of a flare for about 3-5 days   vitamin B-12 (CYANOCOBALAMIN) 1000 MCG tablet Take 1,000 mcg by mouth daily.   vitamin E 1000 UNIT capsule Take 1,000 Units by mouth daily.   No facility-administered encounter medications on file as of 08/09/2024.    Surgical History: Past Surgical History:  Procedure  Laterality Date   ABDOMINAL HYSTERECTOMY     COLONOSCOPY     hysterectomy     with bladder tack   POLYPECTOMY     TUBAL LIGATION      Medical History: Past Medical History:  Diagnosis Date   Allergy    Arthritis    Depression    Diabetes (HCC)    GERD (gastroesophageal reflux disease)    Hyperlipidemia    Hypertension    Personal history of colonic adenomas 06/16/2013   4 adenomas (max 1 cm)  04/2003  no polyps 04/2005    Sleep apnea    uses c-pap    Family History: Non contributory to the present illness  Social History: Social History   Socioeconomic History   Marital status: Widowed    Spouse name: Not on file   Number of children: 2   Years of education: Not on file   Highest education level: 12th grade  Occupational History   Occupation: retired    Comment: asst. Air traffic controller for [employer]  Tobacco Use   Smoking status: Never   Smokeless tobacco: Never  Vaping Use   Vaping status: Never Used  Substance and Sexual Activity   Alcohol use: No   Drug use: No   Sexual activity: Not Currently  Other Topics Concern   Not on file  Social History Narrative   Widowed, 2 daughters (twins)   Social Drivers of Health   Financial Resource Strain: Low Risk  (09/16/2023)   Overall Financial Resource Strain (CARDIA)    Difficulty of Paying Living Expenses: Not hard at all  Food Insecurity: No Food Insecurity (09/16/2023)   Hunger Vital Sign    Worried About Running Out of Food in the Last Year: Never true    Ran Out of Food in the Last Year: Never true  Transportation Needs: No Transportation Needs (09/16/2023)   PRAPARE - Administrator, Civil Service (Medical): No    Lack of Transportation (Non-Medical): No  Physical Activity: Insufficiently Active (09/16/2023)   Exercise Vital Sign    Days of Exercise per Week: 2 days    Minutes of Exercise per Session: 30 min  Stress: No Stress Concern Present (09/16/2023)   [employer] of Occupational Health -  Occupational Stress Questionnaire    Feeling of Stress : Not at all  Social Connections: Moderately Isolated (09/16/2023)   Social Connection and Isolation Panel    Frequency of Communication with Friends and Family: More than three times a week    Frequency of Social Gatherings with Friends and Family: More than three times a week    Attends Religious Services: More than 4 times per year    Active Member of [employer] or Organizations: No    Attends Banker Meetings: Never    Marital Status: Widowed  Intimate Partner Violence: Not At Risk (09/16/2023)   Humiliation, Afraid, Rape, and Kick questionnaire  Fear of Current or Ex-Partner: No    Emotionally Abused: No    Physically Abused: No    Sexually Abused: No    Vital Signs: There were no vitals taken for this visit. There is no height or weight on file to calculate BMI.    Examination: General Appearance: The patient is well-developed, well-nourished, and in no distress. Neck Circumference: 39 cm Skin: Gross inspection of skin unremarkable. Head: normocephalic, no gross deformities. Eyes: no gross deformities noted. ENT: ears appear grossly normal Neurologic: Alert and oriented. No involuntary movements.  STOP BANG RISK ASSESSMENT S (snore) Have you been told that you snore?     NO   T (tired) Are you often tired, fatigued, or sleepy during the day?   NO  O (obstruction) Do you stop breathing, choke, or gasp during sleep? NO   P (pressure) Do you have or are you being treated for high blood pressure? YES   B (BMI) Is your body index greater than 35 kg/m? YES   A (age) Are you 9 years old or older? YES   N (neck) Do you have a neck circumference greater than 16 inches?   NO   G (gender) Are you a female? NO   TOTAL STOP/BANG "YES" ANSWERS 3       A STOP-Bang score of 2 or less is considered low risk, and a score of 5 or more is high risk for having either moderate or severe OSA. For people who score 3 or  4, doctors may need to perform further assessment to determine how likely they are to have OSA.         EPWORTH SLEEPINESS SCALE:  Scale:  (0)= no chance of dozing; (1)= slight chance of dozing; (2)= moderate chance of dozing; (3)= high chance of dozing  Chance  Situtation    Sitting and reading: 1    Watching TV: 0    Sitting Inactive in public: 0    As a passenger in car: 0      Lying down to rest: 0    Sitting and talking: 0    Sitting quielty after lunch: 0    In a car, stopped in traffic: 0   TOTAL SCORE:   1 out of 24    SLEEP STUDIES:  PSG (09/2016) AHI 10/hr, REM AHI 58/hr, min SpO2 79% Titration (09/2016) CPAP@ 7 cmH2O, PLMs 75.7   CPAP COMPLIANCE DATA:  Date Range: 08/11/2023-08/09/2024  Average Daily Use: 8 hours 46 minutes  Median Use: 8 hours 50 minutes  Compliance for > 4 Hours: 99%  AHI: 0.8 respiratory events per hour  Days Used: 361/365 days  Mask Leak: 20.7  95th Percentile Pressure: 7         LABS: No results found for this or any previous visit (from the past 2160 hours).  Radiology: MM 3D SCREENING MAMMOGRAM BILATERAL BREAST Result Date: 06/21/2024 CLINICAL DATA:  Screening. EXAM: DIGITAL SCREENING BILATERAL MAMMOGRAM WITH TOMOSYNTHESIS AND CAD TECHNIQUE: Bilateral screening digital craniocaudal and mediolateral oblique mammograms were obtained. Bilateral screening digital breast tomosynthesis was performed. The images were evaluated with computer-aided detection. COMPARISON:  Previous exam(s). ACR Breast Density Category b: There are scattered areas of fibroglandular density. FINDINGS: There are no findings suspicious for malignancy. IMPRESSION: No mammographic evidence of malignancy. A result letter of this screening mammogram will be mailed directly to the patient. RECOMMENDATION: Screening mammogram in one year. (Code:SM-B-01Y) BI-RADS CATEGORY  1: Negative. Electronically Signed   By: Harrie  Arceo M.D.   On: 06/21/2024 08:53     No results found.  No results found.    Assessment and Plan: Patient Active Problem List   Diagnosis Date Noted   Diabetes mellitus treated with oral medication (HCC) 02/15/2024   OSA on CPAP 08/13/2021   Bilateral carotid bruits 01/11/2020   Osteopenia of spine 07/23/2018   Depression, major, single episode, severe (HCC) 06/17/2018   GERD (gastroesophageal reflux disease) 11/03/2017   Advanced care planning/counseling discussion 11/03/2017   Obesity 03/13/2016   Type 2 diabetes mellitus with obesity (HCC) 09/15/2015   Hypertension associated with type 2 diabetes mellitus (HCC) 09/15/2015   Hyperlipidemia associated with type 2 diabetes mellitus (HCC) 09/15/2015   Primary insomnia 09/15/2015   1. OSA on CPAP (Primary) The patient does tolerate PAP and reports  benefit from PAP use. The patient was reminded how to clean equipment and advised to replace supplies routinely. The patient was also counselled on weight loss. The compliance is excellent. The AHI is 0.8.   OSA on cpap- controlled. Continue with excellent compliance with pap. CPAP continues to be medically necessary to treat this patient's OSA. F/u one year.     2. CPAP use counseling CPAP Counseling: had a lengthy discussion with the patient regarding the importance of PAP therapy in management of the sleep apnea. Patient appears to understand the risk factor reduction and also understands the risks associated with untreated sleep apnea. Patient will try to make a good faith effort to remain compliant with therapy. Also instructed the patient on proper cleaning of the device including the water must be changed daily if possible and use of distilled water is preferred. Patient understands that the machine should be regularly cleaned with appropriate recommended cleaning solutions that do not damage the PAP machine for example given white vinegar and water rinses. Other methods such as ozone treatment may not be as good as  these simple methods to achieve cleaning.   3. Rumination Discussed cbti programs and sleep hygiene. Patient will work on these.   4. Restless leg syndrome There is definitely a restless component to her insomnia. Patient reports I would go crazy if she was asked to stay still to try to sleep. Recommend iron and ferritin testing, supplement if below 75, and trial of gabapentin.      General Counseling: I have discussed the findings of the evaluation and examination with Yaris.  I have also discussed any further diagnostic evaluation thatmay be needed or ordered today. Kaleyah verbalizes understanding of the findings of todays visit. We also reviewed her medications today and discussed drug interactions and side effects including but not limited excessive drowsiness and altered mental states. We also discussed that there is always a risk not just to her but also people around her. she has been encouraged to call the office with any questions or concerns that should arise related to todays visit.  No orders of the defined types were placed in this encounter.       I have personally obtained a history, examined the patient, evaluated laboratory and imaging results, formulated the assessment and plan and placed orders. This patient was seen today by Lauraine Lay, PA-C in collaboration with Dr. Elfreda Bathe.   Elfreda DELENA Bathe, MD [facility] Diplomate ABMS Pulmonary Critical Care Medicine and Sleep Medicine

## 2024-08-07 DIAGNOSIS — G4733 Obstructive sleep apnea (adult) (pediatric): Secondary | ICD-10-CM | POA: Diagnosis not present

## 2024-08-09 ENCOUNTER — Ambulatory Visit (INDEPENDENT_AMBULATORY_CARE_PROVIDER_SITE_OTHER): Admitting: Internal Medicine

## 2024-08-09 VITALS — BP 128/77 | HR 92 | Resp 16 | Ht 63.0 in | Wt 205.0 lb

## 2024-08-09 DIAGNOSIS — Z7189 Other specified counseling: Secondary | ICD-10-CM

## 2024-08-09 DIAGNOSIS — R1110 Vomiting, unspecified: Secondary | ICD-10-CM

## 2024-08-09 DIAGNOSIS — G2581 Restless legs syndrome: Secondary | ICD-10-CM | POA: Diagnosis not present

## 2024-08-09 DIAGNOSIS — G4733 Obstructive sleep apnea (adult) (pediatric): Secondary | ICD-10-CM

## 2024-08-09 MED ORDER — GABAPENTIN 100 MG PO CAPS: ORAL_CAPSULE | ORAL | 2 refills | Status: AC

## 2024-08-09 NOTE — Patient Instructions (Signed)

## 2024-08-10 DIAGNOSIS — J3081 Allergic rhinitis due to animal (cat) (dog) hair and dander: Secondary | ICD-10-CM | POA: Diagnosis not present

## 2024-08-10 DIAGNOSIS — J3089 Other allergic rhinitis: Secondary | ICD-10-CM | POA: Diagnosis not present

## 2024-08-10 DIAGNOSIS — J301 Allergic rhinitis due to pollen: Secondary | ICD-10-CM | POA: Diagnosis not present

## 2024-08-14 NOTE — Patient Instructions (Signed)

## 2024-08-17 ENCOUNTER — Encounter: Payer: Self-pay | Admitting: Nurse Practitioner

## 2024-08-17 ENCOUNTER — Ambulatory Visit: Admitting: Nurse Practitioner

## 2024-08-17 VITALS — BP 128/72 | HR 76 | Temp 98.4°F | Ht 63.5 in | Wt 201.4 lb

## 2024-08-17 DIAGNOSIS — E119 Type 2 diabetes mellitus without complications: Secondary | ICD-10-CM

## 2024-08-17 DIAGNOSIS — Z7984 Long term (current) use of oral hypoglycemic drugs: Secondary | ICD-10-CM | POA: Diagnosis not present

## 2024-08-17 DIAGNOSIS — E1159 Type 2 diabetes mellitus with other circulatory complications: Secondary | ICD-10-CM | POA: Diagnosis not present

## 2024-08-17 DIAGNOSIS — F322 Major depressive disorder, single episode, severe without psychotic features: Secondary | ICD-10-CM

## 2024-08-17 DIAGNOSIS — E66811 Obesity, class 1: Secondary | ICD-10-CM

## 2024-08-17 DIAGNOSIS — E669 Obesity, unspecified: Secondary | ICD-10-CM

## 2024-08-17 DIAGNOSIS — E785 Hyperlipidemia, unspecified: Secondary | ICD-10-CM | POA: Diagnosis not present

## 2024-08-17 DIAGNOSIS — E6609 Other obesity due to excess calories: Secondary | ICD-10-CM

## 2024-08-17 DIAGNOSIS — G4733 Obstructive sleep apnea (adult) (pediatric): Secondary | ICD-10-CM

## 2024-08-17 DIAGNOSIS — I152 Hypertension secondary to endocrine disorders: Secondary | ICD-10-CM | POA: Diagnosis not present

## 2024-08-17 DIAGNOSIS — E1169 Type 2 diabetes mellitus with other specified complication: Secondary | ICD-10-CM

## 2024-08-17 DIAGNOSIS — F5101 Primary insomnia: Secondary | ICD-10-CM | POA: Diagnosis not present

## 2024-08-17 LAB — BAYER DCA HB A1C WAIVED: HB A1C (BAYER DCA - WAIVED): 5.8 % — ABNORMAL HIGH (ref 4.8–5.6)

## 2024-08-17 MED ORDER — METFORMIN HCL 500 MG PO TABS: 500.0000 mg | ORAL_TABLET | Freq: Two times a day (BID) | ORAL | 4 refills | Status: AC

## 2024-08-17 MED ORDER — EZETIMIBE 10 MG PO TABS: ORAL_TABLET | ORAL | 4 refills | Status: AC

## 2024-08-17 NOTE — Progress Notes (Signed)
 BP 128/72   Pulse 76   Temp 98.4 F (36.9 C) (Oral)   Ht 5' 3.5 (1.613 m)   Wt 201 lb 6.4 oz (91.4 kg)   LMP  (LMP Unknown)   SpO2 96%   BMI 35.12 kg/m    Subjective:    Patient ID: Barbara Fitzpatrick, female    DOB: 12/30/1950, 73 y.o.   MRN: [id number]  HPI: Barbara Fitzpatrick is a 73 y.o. female  Chief Complaint  Patient presents with   Diabetes   Hyperlipidemia   Hypertension   DIABETES Diagnosed in August 2022 with A1c 7.7% .  February A1c 5.3%.  Continues Metformin  500 MG BID, sometimes will take extra if BS elevated. Maintaining weight loss. Takes B12 at home daily. Hypoglycemic episodes:no Polydipsia/polyuria: no Visual disturbance: no Chest pain: no Paresthesias: no Glucose Monitoring: yes             Accucheck frequency: TID             Fasting glucose: 92 to 122             Post prandial:              Evening:              Before meals: Taking Insulin?: no             Long acting insulin:             Short acting insulin: Blood Pressure Monitoring: not checking Retinal Examination: Not Up to Date -- Dr. Christina at [facility] -- October Foot Exam: Up to Date Pneumovax: Up to Date Influenza: Up to Date Aspirin: no    HYPERTENSION/HLD Taking Lotensin  & Lipitor + Zetia . Has CPAP and uses 100% of the time, saw Dr. Fernand on 08/09/24 and they would like iron and ferritin checked as at night patient is moving a lot.  So they feel she is not getting good rest, does not always feel awake in the morning.  They want to start her on Gabapentin  at night. Hypertension status: controlled  Satisfied with current treatment? yes Duration of hypertension: chronic BP monitoring frequency: a few times a week BP range: 96/68 to 141/86, on average <130/80 range -- HR in 60-80 range BP medication side effects:  no Medication compliance: good compliance Previous BP meds: none Aspirin: yes Recurrent headaches: no Visual changes: no Palpitations: no Dyspnea: no Chest pain:  no Lower extremity edema: occasional if on legs for awhile Dizzy/lightheaded: no   DEPRESSION Continues Wellbutrin  XL daily. Mood status: controlled Satisfied with current treatment?: yes Symptom severity: mild  Duration of current treatment : chronic Side effects: no Medication compliance: excellent compliance Psychotherapy/counseling: no never gone Previous psychiatric medications:none Depressed mood: occasional Anxious mood: occasional Anhedonia: no Significant weight loss or gain: no Insomnia: occasionally Fatigue: no Feelings of worthlessness or guilt: no Impaired concentration/indecisiveness: no Suicidal ideations: no Hopelessness: no Crying spells: no    08/17/2024   10:19 AM 02/18/2024    8:44 AM 09/25/2023   11:03 AM 09/16/2023    1:45 PM 08/13/2023    9:40 AM  Depression screen PHQ 2/9  Decreased Interest 1 1 0 0 1  Down, Depressed, Hopeless 0 1 1 0 1  PHQ - 2 Score 1 2 1  0 2  Altered sleeping 1 2 2  0 1  Tired, decreased energy 1 1 1  0 1  Change in appetite 1 2 2  0 1  Feeling bad or failure about  yourself  0 0 0 0 0  Trouble concentrating 0 0 0 0 0  Moving slowly or fidgety/restless 0 0 0 0 0  Suicidal thoughts 0 0 0 0 0  PHQ-9 Score 4 7 6  0 5  Difficult doing work/chores Not difficult at all Somewhat difficult Somewhat difficult Not difficult at all Somewhat difficult       08/17/2024   10:19 AM 02/18/2024    8:45 AM 09/25/2023   11:04 AM 08/13/2023    9:40 AM  GAD 7 : Generalized Anxiety Score  Nervous, Anxious, on Edge 1 1 2 1   Control/stop worrying 0 2 2 1   Worry too much - different things 1 1 2 1   Trouble relaxing 0 1 3 2   Restless 0 1 1 1   Easily annoyed or irritable 0 0 0 0  Afraid - awful might happen 0 0 0 0  Total GAD 7 Score 2 6 10 6   Anxiety Difficulty Not difficult at all Somewhat difficult Not difficult at all Not difficult at all   Relevant past medical, surgical, family and social history reviewed and updated as indicated. Interim  medical history since our last visit reviewed. Allergies and medications reviewed and updated.  Review of Systems  Constitutional:  Negative for activity change, appetite change, diaphoresis, fatigue and fever.  Respiratory:  Negative for cough, chest tightness, shortness of breath and wheezing.   Cardiovascular:  Negative for chest pain, palpitations and leg swelling.  Gastrointestinal: Negative.   Neurological: Negative.   Psychiatric/Behavioral: Negative.      Per HPI unless specifically indicated above     Objective:    BP 128/72   Pulse 76   Temp 98.4 F (36.9 C) (Oral)   Ht 5' 3.5 (1.613 m)   Wt 201 lb 6.4 oz (91.4 kg)   LMP  (LMP Unknown)   SpO2 96%   BMI 35.12 kg/m   Wt Readings from Last 3 Encounters:  08/17/24 201 lb 6.4 oz (91.4 kg)  08/09/24 205 lb (93 kg)  03/17/24 199 lb 12.8 oz (90.6 kg)    Physical Exam Vitals and nursing note reviewed.  Constitutional:      General: She is awake. She is not in acute distress.    Appearance: She is well-developed and well-groomed. She is obese. She is not ill-appearing or toxic-appearing.  HENT:     Head: Normocephalic.     Right Ear: Hearing and external ear normal.     Left Ear: Hearing and external ear normal.  Eyes:     General: Lids are normal.        Right eye: No discharge.        Left eye: No discharge.     Conjunctiva/sclera: Conjunctivae normal.     Pupils: Pupils are equal, round, and reactive to light.  Neck:     Thyroid : No thyromegaly.     Vascular: Carotid bruit (remain mild) present.  Cardiovascular:     Rate and Rhythm: Normal rate and regular rhythm.     Heart sounds: Normal heart sounds. No murmur heard.    No gallop.  Pulmonary:     Effort: Pulmonary effort is normal. No accessory muscle usage or respiratory distress.     Breath sounds: Normal breath sounds.  Abdominal:     General: Bowel sounds are normal. There is no distension.     Palpations: Abdomen is soft.     Tenderness: There is  no abdominal tenderness.  Musculoskeletal:  Cervical back: Normal range of motion and neck supple.     Right lower leg: No edema.     Left lower leg: No edema.  Lymphadenopathy:     Cervical: No cervical adenopathy.  Skin:    General: Skin is warm and dry.  Neurological:     Mental Status: She is alert and oriented to person, place, and time.     Deep Tendon Reflexes: Reflexes are normal and symmetric.     Reflex Scores:      Brachioradialis reflexes are 2+ on the right side and 2+ on the left side.      Patellar reflexes are 2+ on the right side and 2+ on the left side. Psychiatric:        Attention and Perception: Attention normal.        Mood and Affect: Mood normal.        Speech: Speech normal.        Behavior: Behavior normal. Behavior is cooperative.        Thought Content: Thought content normal.    Diabetic Foot Exam - Simple   Simple Foot Form Visual Inspection No deformities, no ulcerations, no other skin breakdown bilaterally: Yes Sensation Testing Intact to touch and monofilament testing bilaterally: Yes Pulse Check Posterior Tibialis and Dorsalis pulse intact bilaterally: Yes Comments     Results for orders placed or performed in visit on 03/17/24  Basic metabolic panel   Collection Time: 03/17/24 10:18 AM  Result Value Ref Range   Glucose 110 (H) 70 - 99 mg/dL   BUN 13 8 - 27 mg/dL   Creatinine, Ser 9.25 0.57 - 1.00 mg/dL   eGFR 86 >40 fO/fpw/8.26   BUN/Creatinine Ratio 18 12 - 28   Sodium 142 134 - 144 mmol/L   Potassium 3.8 3.5 - 5.2 mmol/L   Chloride 102 96 - 106 mmol/L   CO2 26 20 - 29 mmol/L   Calcium  9.9 8.7 - 10.3 mg/dL      Assessment & Plan:   Problem List Items Addressed This Visit       Cardiovascular and Mediastinum   Hypertension associated with type 2 diabetes mellitus (HCC)   Chronic, stable.  BP at goal at home and in office.  Recommend she monitor BP at least a few mornings a week at home and document.  DASH diet at home.   Continue Benazepril -HCTZ to 20 MG-12.5 MG two tablets daily = 40 MG and 25 MG total daily.  Labs: CMP.  If electrolytes are abnormal may need to adjust regimen, could consider reducing back down to one tablet and then adding on Amlodipine.      Relevant Medications   ezetimibe  (ZETIA ) 10 MG tablet   metFORMIN  (GLUCOPHAGE ) 500 MG tablet   Other Relevant Orders   Bayer DCA Hb A1c Waived   Iron   Ferritin     Respiratory   OSA on CPAP   Chronic, continue to use CPAP 100% of the time.  Continue to collaborate with pulmonary. Check iron and ferritin today per Sleep provider recommendations. Educated her on Gabapentin  and may trial this dependent on labs..        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 Recommend continued heavy focus on diet and exercise regimen.  Continue Metformin  500 MG BID, but advised her if consistent BS <70 reduce to once a day.  Check  BS three mornings a week and document.  Recheck A1c in 6 months. Could consider Ozempic in future for diabetes and weight control. - Up to date on eye and foot exams - Vaccinations up to date - ACE and statin on board      Relevant Medications   metFORMIN  (GLUCOPHAGE ) 500 MG tablet   Other Relevant Orders   Bayer DCA Hb A1c Waived   Hyperlipidemia associated with type 2 diabetes mellitus (HCC)   Chronic, stable.  Continue current medication regimen and adjust as needed.  Lipid panel today.      Relevant Medications   ezetimibe  (ZETIA ) 10 MG tablet   metFORMIN  (GLUCOPHAGE ) 500 MG tablet   Other Relevant Orders   Bayer DCA Hb A1c Waived   Comprehensive metabolic panel with GFR   Lipid Panel w/o Chol/HDL Ratio   Iron   Ferritin   Diabetes mellitus treated with oral medication (HCC)   Refer to diabetes with obesity plan of care.      Relevant Medications   metFORMIN  (GLUCOPHAGE ) 500 MG tablet   Other Relevant Orders   Bayer DCA  Hb A1c Waived     Other   Primary insomnia   Chronic, ongoing.  No benefit from Trazodone . Check iron and ferritin today per Sleep provider recommendations. Educated her on Gabapentin  and may trial this dependent on labs..  Recommend: Sleep as long as necessary to feel rested (usually seven to eight hours for adults) and then get out of bed ?Maintain a regular sleep schedule, particularly a regular wake-up time in the morning ?Try not to force sleep ?Avoid caffeinated beverages after lunch ?Avoid alcohol near bedtime (eg, late afternoon and evening)  ?Avoid smoking or other nicotine intake, particularly during the evening ?Adjust the bedroom environment as needed to decrease stimuli (eg, reduce ambient light, turn off the television or radio) ?Avoid prolonged use of light-emitting screens (laptops, tablets, smartphones, ebooks) before bedtime  ?Resolve concerns or worries before bedtime ?Exercise regularly for at least 20 minutes, preferably more than four to five hours prior to bedtime  ?Avoid daytime naps, especially if they are longer than 20 to 30 minutes or occur late in the day       Obesity   BMI 35.12, maintaining loss.  Recommended eating smaller high protein, low fat meals more frequently and exercising 30 mins a day 5 times a week with a goal of 10-15lb weight loss in the next 3 months. Patient voiced their understanding and motivation to adhere to these recommendations.       Relevant Medications   metFORMIN  (GLUCOPHAGE ) 500 MG tablet   Depression, major, single episode, severe (HCC)   Chronic, ongoing.  Continue current medication regimen, Wellbutrin  XL 300 MG daily.  Denies SI/HI -- is aware to notify provider immediately if SI presents or plan.  No medication adjustment at this time.          Follow up plan: Return in about 6 months (around 02/17/2025) for Annual Physical -- diabetes check after 02/17/25.

## 2024-08-17 NOTE — Assessment & Plan Note (Signed)
 Refer to diabetes with obesity plan of care.

## 2024-08-17 NOTE — Assessment & Plan Note (Signed)
 Chronic, ongoing.  Continue current medication regimen, Wellbutrin XL 300 MG daily.  Denies SI/HI -- is aware to notify provider immediately if SI presents or plan.  No medication adjustment at this time.

## 2024-08-17 NOTE — Assessment & Plan Note (Signed)
 Chronic, continue to use CPAP 100% of the time.  Continue to collaborate with pulmonary. Check iron and ferritin today per Sleep provider recommendations. Educated her on Gabapentin  and may trial this dependent on labs.Barbara Fitzpatrick

## 2024-08-17 NOTE — Assessment & Plan Note (Signed)
 BMI 35.12, maintaining loss.  Recommended eating smaller high protein, low fat meals more frequently and exercising 30 mins a day 5 times a week with a goal of 10-15lb weight loss in the next 3 months. Patient voiced their understanding and motivation to adhere to these recommendations.

## 2024-08-17 NOTE — Assessment & Plan Note (Signed)
 Chronic, ongoing.  No benefit from Trazodone . Check iron and ferritin today per Sleep provider recommendations. Educated her on Gabapentin  and may trial this dependent on labs..  Recommend: Sleep as long as necessary to feel rested (usually seven to eight hours for adults) and then get out of bed ?Maintain a regular sleep schedule, particularly a regular wake-up time in the morning ?Try not to force sleep ?Avoid caffeinated beverages after lunch ?Avoid alcohol near bedtime (eg, late afternoon and evening)  ?Avoid smoking or other nicotine intake, particularly during the evening ?Adjust the bedroom environment as needed to decrease stimuli (eg, reduce ambient light, turn off the television or radio) ?Avoid prolonged use of light-emitting screens (laptops, tablets, smartphones, ebooks) before bedtime  ?Resolve concerns or worries before bedtime ?Exercise regularly for at least 20 minutes, preferably more than four to five hours prior to bedtime  ?Avoid daytime naps, especially if they are longer than 20 to 30 minutes or occur late in the day

## 2024-08-17 NOTE — Assessment & Plan Note (Signed)
 Chronic, stable.  BP at goal at home and in office.  Recommend she monitor BP at least a few mornings a week at home and document.  DASH diet at home.  Continue Benazepril -HCTZ to 20 MG-12.5 MG two tablets daily = 40 MG and 25 MG total daily.  Labs: CMP.  If electrolytes are abnormal may need to adjust regimen, could consider reducing back down to one tablet and then adding on Amlodipine.

## 2024-08-17 NOTE — Assessment & Plan Note (Signed)
 Chronic, stable.  Continue current medication regimen and adjust as needed.  Lipid panel today.

## 2024-08-17 NOTE — Assessment & Plan Note (Signed)
 Chronic, ongoing with A1c 6.2% today and urine ALB 02 February 2024. Recommend continued heavy focus on diet and exercise regimen.  Continue Metformin  500 MG BID, but advised her if consistent BS <70 reduce to once a day.  Check BS three mornings a week and document.  Recheck A1c in 6 months. Could consider Ozempic in future for diabetes and weight control. - Up to date on eye and foot exams - Vaccinations up to date - ACE and statin on board

## 2024-08-18 ENCOUNTER — Ambulatory Visit: Payer: Self-pay | Admitting: Nurse Practitioner

## 2024-08-18 ENCOUNTER — Encounter: Payer: Self-pay | Admitting: Nurse Practitioner

## 2024-08-18 LAB — COMPREHENSIVE METABOLIC PANEL WITH GFR
ALT: 24 IU/L (ref 0–32)
AST: 17 IU/L (ref 0–40)
Albumin: 4.1 g/dL (ref 3.8–4.8)
Alkaline Phosphatase: 80 IU/L (ref 44–121)
BUN/Creatinine Ratio: 14 (ref 12–28)
BUN: 11 mg/dL (ref 8–27)
Bilirubin Total: 0.4 mg/dL (ref 0.0–1.2)
CO2: 22 mmol/L (ref 20–29)
Calcium: 9.8 mg/dL (ref 8.7–10.3)
Chloride: 103 mmol/L (ref 96–106)
Creatinine, Ser: 0.77 mg/dL (ref 0.57–1.00)
Globulin, Total: 1.8 g/dL (ref 1.5–4.5)
Glucose: 104 mg/dL — ABNORMAL HIGH (ref 70–99)
Potassium: 3.7 mmol/L (ref 3.5–5.2)
Sodium: 142 mmol/L (ref 134–144)
Total Protein: 5.9 g/dL — ABNORMAL LOW (ref 6.0–8.5)
eGFR: 82 mL/min/1.73 (ref >59)

## 2024-08-18 LAB — LIPID PANEL W/O CHOL/HDL RATIO
Cholesterol, Total: 101 mg/dL (ref 100–199)
HDL: 38 mg/dL — ABNORMAL LOW (ref >39)
LDL Chol Calc (NIH): 41 mg/dL (ref 0–99)
Triglycerides: 119 mg/dL (ref 0–149)
VLDL Cholesterol Cal: 22 mg/dL (ref 5–40)

## 2024-08-18 LAB — IRON: Iron: 54 ug/dL (ref 27–139)

## 2024-08-18 LAB — FERRITIN: Ferritin: 67 ng/mL (ref 15–150)

## 2024-08-18 NOTE — Progress Notes (Signed)
 Contacted via MyChart  Good afternoon Barbara Fitzpatrick, your labs have returned: - Kidney function, creatinine and eGFR, remains normal, as is liver function, AST and ALT.  - Lipid panel shows levels are goal, great job!! - Iron level on lower side of normal, you may benefit a iron supplement a few days a week.  I recommend Vitron over the counter.  This often is easier on the belly and has Vitamin C in with it, which helps iron absorb in system.  Ferritin normal.  Any questions? Keep being amazing!!  Thank you for allowing me to participate in your care.  I appreciate you. Kindest regards, Domique Clapper

## 2024-08-20 DIAGNOSIS — J3089 Other allergic rhinitis: Secondary | ICD-10-CM | POA: Diagnosis not present

## 2024-08-20 DIAGNOSIS — J301 Allergic rhinitis due to pollen: Secondary | ICD-10-CM | POA: Diagnosis not present

## 2024-08-20 DIAGNOSIS — J3081 Allergic rhinitis due to animal (cat) (dog) hair and dander: Secondary | ICD-10-CM | POA: Diagnosis not present

## 2024-08-27 DIAGNOSIS — J3089 Other allergic rhinitis: Secondary | ICD-10-CM | POA: Diagnosis not present

## 2024-09-20 ENCOUNTER — Telehealth: Payer: Self-pay | Admitting: Nurse Practitioner

## 2024-09-20 NOTE — Telephone Encounter (Signed)
 Copied from CRM [id number]. Topic: Medicare AWV >> Sep 20, 2024  1:31 PM Barbara DEL wrote: Reason for CRM: LVM 09/20/2024 reminder call for AWV appt on Tuesday 09/21/2024 to confirm awv  Barbara Fitzpatrick; Care Guide Ambulatory Clinical Support [place] [facility] Direct Dial: [phone]

## 2024-09-21 ENCOUNTER — Ambulatory Visit (INDEPENDENT_AMBULATORY_CARE_PROVIDER_SITE_OTHER): Payer: Self-pay | Admitting: Emergency Medicine

## 2024-09-21 VITALS — BP 113/72 | HR 69 | Ht 64.0 in | Wt 196.0 lb

## 2024-09-21 DIAGNOSIS — Z0000 Encounter for general adult medical examination without abnormal findings: Secondary | ICD-10-CM

## 2024-09-21 NOTE — Patient Instructions (Signed)
 [name],  Thank you for taking the time for your Medicare Wellness Visit. I appreciate your continued commitment to your health goals. Please review the care plan we discussed, and feel free to reach out if I can assist you further.  Medicare recommends these wellness visits once per year to help you and your care team stay ahead of potential health issues. These visits are designed to focus on prevention, allowing your provider to concentrate on managing your acute and chronic conditions during your regular appointments.  Please note that Annual Wellness Visits do not include a physical exam. Some assessments may be limited, especially if the visit was conducted virtually. If needed, we may recommend a separate in-person follow-up with your provider.  Ongoing Care Seeing your primary care provider every 3 to 6 months helps us  monitor your health and provide consistent, personalized care.   Referrals If a referral was made during today's visit and you haven't received any updates within two weeks, please contact the referred provider directly to check on the status.  Recommended Screenings:  Keep up the good work!  Health Maintenance  Topic Date Due   COVID-19 Vaccine (7 - 2025-26 season) 08/23/2024   Eye exam for diabetics  10/15/2024   Yearly kidney health urinalysis for diabetes  02/17/2025   Hemoglobin A1C  02/17/2025   Breast Cancer Screening  06/17/2025   Colon Cancer Screening  07/08/2025   Yearly kidney function blood test for diabetes  08/17/2025   Complete foot exam   08/17/2025   Medicare Annual Wellness Visit  09/21/2025   DEXA scan (bone density measurement)  10/07/2028   DTaP/Tdap/Td vaccine (3 - Td or Tdap) 08/05/2032   Pneumococcal Vaccine for age over 47  Completed   Flu Shot  Completed   Hepatitis C Screening  Completed   Zoster (Shingles) Vaccine  Completed   HPV Vaccine  Aged Out   Meningitis B Vaccine  Aged Out       09/21/2024    1:20 PM  Advanced  Directives  Does Patient Have a Medical Advance Directive? Yes  Type of Estate agent of [employer];Living will  Does patient want to make changes to medical advance directive? No - Patient declined  Copy of Healthcare Power of Attorney in Chart? Yes - validated most recent copy scanned in chart (See row information)   Advance Care Planning is important because it: Ensures you receive medical care that aligns with your values, goals, and preferences. Provides guidance to your family and loved ones, reducing the emotional burden of decision-making during critical moments.  Vision: Annual vision screenings are recommended for early detection of glaucoma, cataracts, and diabetic retinopathy. These exams can also reveal signs of chronic conditions such as diabetes and high blood pressure.  Dental: Annual dental screenings help detect early signs of oral cancer, gum disease, and other conditions linked to overall health, including heart disease and diabetes.  Please see the attached documents for additional preventive care recommendations.   Fall Prevention in the Home, Adult Falls can cause injuries and affect people of all ages. There are many simple things that you can do to make your home safe and to help prevent falls. If you need it, ask for help making these changes. What actions can I take to prevent falls? General information Use good lighting in all rooms. Make sure to: Replace any light bulbs that burn out. Turn on lights if it is dark and use night-lights. Keep items that you use  often in easy-to-reach places. Lower the shelves around your home if needed. Move furniture so that there are clear paths around it. Do not keep throw rugs or other things on the floor that can make you trip. If any of your floors are uneven, fix them. Add color or contrast paint or tape to clearly mark and help you see: Grab bars or handrails. First and last steps of  staircases. Where the edge of each step is. If you use a ladder or stepladder: Make sure that it is fully opened. Do not climb a closed ladder. Make sure the sides of the ladder are locked in place. Have someone hold the ladder while you use it. Know where your pets are as you move through your home. What can I do in the bathroom?     Keep the floor dry. Clean up any water that is on the floor right away. Remove soap buildup in the bathtub or shower. Buildup makes bathtubs and showers slippery. Use non-skid mats or decals on the floor of the bathtub or shower. Attach bath mats securely with double-sided, non-slip rug tape. If you need to sit down while you are in the shower, use a non-slip stool. Install grab bars by the toilet and in the bathtub and shower. Do not use towel bars as grab bars. What can I do in the bedroom? Make sure that you have a light by your bed that is easy to reach. Do not use any sheets or blankets on your bed that hang to the floor. Have a firm bench or chair with side arms that you can use for support when you get dressed. What can I do in the kitchen? Clean up any spills right away. If you need to reach something above you, use a sturdy step stool that has a grab bar. Keep electrical cables out of the way. Do not use floor polish or wax that makes floors slippery. What can I do with my stairs? Do not leave anything on the stairs. Make sure that you have a light switch at the top and the bottom of the stairs. Have them installed if you do not have them. Make sure that there are handrails on both sides of the stairs. Fix handrails that are broken or loose. Make sure that handrails are as long as the staircases. Install non-slip stair treads on all stairs in your home if they do not have carpet. Avoid having throw rugs at the top or bottom of stairs, or secure the rugs with carpet tape to prevent them from moving. Choose a carpet design that does not hide the  edge of steps on the stairs. Make sure that carpet is firmly attached to the stairs. Fix any carpet that is loose or worn. What can I do on the outside of my home? Use bright outdoor lighting. Repair the edges of walkways and driveways and fix any cracks. Clear paths of anything that can make you trip, such as tools or rocks. Add color or contrast paint or tape to clearly mark and help you see high doorway thresholds. Trim any bushes or trees on the main path into your home. Check that handrails are securely fastened and in good repair. Both sides of all steps should have handrails. Install guardrails along the edges of any raised decks or porches. Have leaves, snow, and ice cleared regularly. Use sand, salt, or ice melt on walkways during winter months if you live where there is ice and snow. In  the garage, clean up any spills right away, including grease or oil spills. What other actions can I take? Review your medicines with your health care provider. Some medicines can make you confused or feel dizzy. This can increase your chance of falling. Wear closed-toe shoes that fit well and support your feet. Wear shoes that have rubber soles and low heels. Use a cane, walker, scooter, or crutches that help you move around if needed. Talk with your provider about other ways that you can decrease your risk of falls. This may include seeing a physical therapist to learn to do exercises to improve movement and strength. Where to find more information Centers for Disease Control and Prevention, [employer]: TonerPromos.no [employer] on Aging: BaseRingTones.pl National Institute on Aging: BaseRingTones.pl Contact a health care provider if: You are afraid of falling at home. You feel weak, drowsy, or dizzy at home. You fall at home. Get help right away if you: Lose consciousness or have trouble moving after a fall. Have a fall that causes a head injury. These symptoms may be an emergency. Get help right away. Call  911. Do not wait to see if the symptoms will go away. Do not drive yourself to the hospital. This information is not intended to replace advice given to you by your health care provider. Make sure you discuss any questions you have with your health care provider. Document Revised: 08/12/2022 Document Reviewed: 08/12/2022 Elsevier Patient Education  2024 [employer].

## 2024-09-21 NOTE — Progress Notes (Signed)
 Subjective:   Barbara Fitzpatrick is a 73 y.o. who presents for a Medicare Wellness preventive visit.  As a reminder, Annual Wellness Visits don't include a physical exam, and some assessments may be limited, especially if this visit is performed virtually. We may recommend an in-person follow-up visit with your provider if needed.  Visit Complete: Virtual I connected with  Barbara Fitzpatrick on 09/21/24 by a audio enabled telemedicine application and verified that I am speaking with the correct person using two identifiers.  Patient Location: Home  Provider Location: Office/Clinic  I discussed the limitations of evaluation and management by telemedicine. The patient expressed understanding and agreed to proceed.  Vital Signs: Because this visit was a virtual/telehealth visit, some criteria may be missing or patient reported. Any vitals not documented were not able to be obtained and vitals that have been documented are patient reported.  VideoDeclined- This patient declined Librarian, academic. Therefore the visit was completed with audio only.  Persons Participating in Visit: Patient.  AWV Questionnaire: No: Patient Medicare AWV questionnaire was not completed prior to this visit.  Cardiac Risk Factors include: advanced age (>27men, >50 women);diabetes mellitus;hypertension;dyslipidemia;obesity (BMI >30kg/m2);Other (see comment), Risk factor comments: OSA (cpap)     Objective:    Today's Vitals   09/21/24 1307  BP: 113/72  Pulse: 69  Weight: 196 lb (88.9 kg)  Height: 5' 4 (1.626 m)   Body mass index is 33.64 kg/m.     09/21/2024    1:20 PM 09/16/2023    1:47 PM 09/07/2021    5:22 PM 08/11/2020   11:17 AM 08/02/2019   11:13 AM 06/15/2018    8:44 AM 04/10/2017    1:35 PM  Advanced Directives  Does Patient Have a Medical Advance Directive? Yes Yes Yes Yes Yes Yes  Yes   Type of Estate agent of [place];Living will Healthcare  Power of [place];Living will Healthcare Power of [place];Living will Healthcare Power of [place];Living will Living will;Healthcare Power of [employer] Power of [place];Living will Living will;Healthcare Power of Attorney  Does patient want to make changes to medical advance directive? No - Patient declined No - Patient declined No - Patient declined      Copy of Healthcare Power of Attorney in Chart? Yes - validated most recent copy scanned in chart (See row information) No - copy requested  No - copy requested No - copy requested  No - copy requested  No - copy requested      Data saved with a previous flowsheet row definition    Current Medications (verified) Outpatient Encounter Medications as of 09/21/2024  Medication Sig   ACCU-CHEK GUIDE test strip TEST THREE TIMES DAILY   Accu-Chek Softclix Lancets lancets USE TO CHECK BLOOD SUGAR THREE TIMES DAILY AND DOCUMENT FOR VISITS. BRING LOG TO VISITS   acyclovir  ointment (ZOVIRAX ) 5 % Apply 1 Application topically 3 (three) times daily as needed.   aspirin 81 MG tablet Take 81 mg by mouth daily.   atorvastatin  (LIPITOR) 80 MG tablet TAKE 1 TABLET(80 MG) BY MOUTH DAILY   benazepril -hydrochlorthiazide (LOTENSIN  HCT) 20-12.5 MG tablet Take 2 tablets by mouth daily.   buPROPion  (WELLBUTRIN  XL) 300 MG 24 hr tablet TAKE 1 TABLET(300 MG) BY MOUTH DAILY   cetirizine (ZYRTEC) 10 MG tablet Take 10 mg by mouth daily.   Cholecalciferol (VITAMIN D -3 PO) Take 3,000 Units by mouth daily.    Coenzyme Q10 (CO Q-10 PO) Take by mouth daily.  cyclobenzaprine  (FLEXERIL ) 10 MG tablet Take 1 tablet (10 mg total) by mouth 3 (three) times daily as needed for muscle spasms.   EPIPEN 2-PAK 0.3 MG/0.3ML SOAJ injection as needed.    ezetimibe  (ZETIA ) 10 MG tablet TAKE 1 TABLET(10 MG) BY MOUTH DAILY   gabapentin  (NEURONTIN ) 100 MG capsule Take one capsule 90 minutes before bedtime, after two weeks increase to two capsules. (Patient taking differently: Take one  capsule 90 minutes before bedtime, after two weeks increase to two capsules. Taking 1 capsule daily)   glucosamine-chondroitin 500-400 MG tablet Take 1 tablet by mouth 2 (two) times daily.   glucose blood (ACCU-CHEK GUIDE TEST) test strip 1 each by Other route 3 (three) times daily. Use as instructed   ipratropium (ATROVENT) 0.06 % nasal spray Place into both nostrils.   Magnesium 250 MG TABS Take by mouth daily.    metFORMIN  (GLUCOPHAGE ) 500 MG tablet Take 1 tablet (500 mg total) by mouth 2 (two) times daily with a meal.   Multiple Vitamins-Minerals (MULTIVITAMIN PO) Take by mouth daily.   Olopatadine HCl 0.6 % SOLN Place 2 sprays into both nostrils 2 (two) times daily.   omeprazole (PRILOSEC) 20 MG capsule Take 20 mg by mouth daily.   valACYclovir  (VALTREX ) 1000 MG tablet Take one tablet twice daily at the first sign of a flare for about 3-5 days   vitamin B-12 (CYANOCOBALAMIN ) 1000 MCG tablet Take 1,000 mcg by mouth daily.   vitamin E 1000 UNIT capsule Take 1,000 Units by mouth daily.   No facility-administered encounter medications on file as of 09/21/2024.    Allergies (verified) Patient has no known allergies.   History: Past Medical History:  Diagnosis Date   Allergy    Arthritis    Depression    Diabetes (HCC)    GERD (gastroesophageal reflux disease)    Hyperlipidemia    Hypertension    Personal history of colonic adenomas 06/16/2013   4 adenomas (max 1 cm)  04/2003  no polyps 04/2005    Sleep apnea    uses c-pap   Past Surgical History:  Procedure Laterality Date   ABDOMINAL HYSTERECTOMY     COLONOSCOPY     hysterectomy     with bladder tack   POLYPECTOMY     TUBAL LIGATION     Family History  Problem Relation Age of Onset   Hypertension Mother    Hyperlipidemia Mother    Diabetes Mother    Cancer Mother        uterine or ovarian cancer   Glaucoma Mother    Heart disease Father    Hypertension Father    Hyperlipidemia Father    Diabetes Father     Diabetes Sister    Lung disease Sister        polyps   Cancer Brother        kidney- on dialysis   Glaucoma Brother    Hyperlipidemia Brother    Hypertension Brother    Cancer Maternal Aunt 50       ovarian   Stroke Maternal Grandfather    Diabetes Paternal Grandmother    Heart disease Paternal Grandfather    Esophageal cancer Neg Hx    Rectal cancer Neg Hx    Stomach cancer Neg Hx    Breast cancer Neg Hx    Colon polyps Neg Hx    Social History   Socioeconomic History   Marital status: Widowed    Spouse name: Not on file   Number  of children: 2   Years of education: Not on file   Highest education level: 12th grade  Occupational History   Occupation: retired    Comment: asst. Air traffic controller for [employer]  Tobacco Use   Smoking status: Never   Smokeless tobacco: Never  Vaping Use   Vaping status: Never Used  Substance and Sexual Activity   Alcohol use: No   Drug use: No   Sexual activity: Not Currently  Other Topics Concern   Not on file  Social History Narrative   Widowed, 2 daughters (twins)   Social Drivers of Health   Financial Resource Strain: Low Risk  (09/21/2024)   Overall Financial Resource Strain (CARDIA)    Difficulty of Paying Living Expenses: Not hard at all  Food Insecurity: No Food Insecurity (09/21/2024)   Hunger Vital Sign    Worried About Running Out of Food in the Last Year: Never true    Ran Out of Food in the Last Year: Never true  Transportation Needs: No Transportation Needs (09/21/2024)   PRAPARE - Administrator, Civil Service (Medical): No    Lack of Transportation (Non-Medical): No  Physical Activity: Inactive (09/21/2024)   Exercise Vital Sign    Days of Exercise per Week: 0 days    Minutes of Exercise per Session: 0 min  Stress: No Stress Concern Present (09/21/2024)   [employer] of Occupational Health - Occupational Stress Questionnaire    Feeling of Stress: Not at all  Social Connections: Socially Isolated  (09/21/2024)   Social Connection and Isolation Panel    Frequency of Communication with Friends and Family: More than three times a week    Frequency of Social Gatherings with Friends and Family: More than three times a week    Attends Religious Services: Never    Database administrator or Organizations: No    Attends Banker Meetings: Never    Marital Status: Widowed    Tobacco Counseling Counseling given: Not Answered    Clinical Intake:  Pre-visit preparation completed: Yes  Pain : No/denies pain     BMI - recorded: 33.64 Nutritional Status: BMI > 30  Obese Nutritional Risks: None Diabetes: Yes CBG done?: No (FBS 107 per patient) Did pt. bring in CBG monitor from home?: No  Lab Results  Component Value Date   HGBA1C 5.8 (H) 08/17/2024   HGBA1C 5.3 02/18/2024   HGBA1C 5.7 (H) 08/13/2023     How often do you need to have someone help you when you read instructions, pamphlets, or other written materials from your doctor or pharmacy?: 1 - Never  Interpreter Needed?: No  Information entered by :: Vina Ned, CMA   Activities of Daily Living     09/21/2024    1:10 PM 02/18/2024    9:15 AM  In your present state of health, do you have any difficulty performing the following activities:  Hearing? 0 0  Vision? 0 0  Difficulty concentrating or making decisions? 0 0  Walking or climbing stairs? 0 0  Dressing or bathing? 0 0  Doing errands, shopping? 0 0  Preparing Food and eating ? N   Using the Toilet? N   In the past six months, have you accidently leaked urine? Y   Comment sometimes at night, wears panty liner   Do you have problems with loss of bowel control? N   Managing your Medications? N   Managing your Finances? N   Housekeeping or managing your  Housekeeping? N     Patient Care Team: Cannady, Jolene T, NP as PCP - General (Nurse Practitioner) Lucio Franky PARAS, OD (Optometry) Fernand Elfreda LABOR, MD as Consulting Physician (Pulmonary  Disease) Avram Lupita BRAVO, MD as Consulting Physician (Gastroenterology) Frutoso Luz, MD as Referring Physician (Allergy)  I have updated your Care Teams any recent Medical Services you may have received from other providers in the past year.     Assessment:   This is a routine wellness examination for Barbara Fitzpatrick.  Hearing/Vision screen Hearing Screening - Comments:: Denies hearing loss  Vision Screening - Comments:: Gets DM eye exam, Dr. Lucio Jacobs [place]   Goals Addressed               This Visit's Progress     Increase physical activity (pt-stated)         Depression Screen     09/21/2024    1:18 PM 08/17/2024   10:19 AM 02/18/2024    8:44 AM 09/25/2023   11:03 AM 09/16/2023    1:45 PM 08/13/2023    9:40 AM 02/12/2023    9:47 AM  PHQ 2/9 Scores  PHQ - 2 Score 2 1 2 1  0 2 2  PHQ- 9 Score 3 4 7 6  0 5 7    Fall Risk     09/21/2024    1:23 PM 02/18/2024    8:44 AM 09/25/2023   11:03 AM 09/16/2023    1:48 PM 08/13/2023    9:39 AM  Fall Risk   Falls in the past year? 1 0 0 0 0  Number falls in past yr: 0 0 0 0 0  Injury with Fall? 0 0 0 0 0  Risk for fall due to : History of fall(s);Impaired balance/gait;Orthopedic patient No Fall Risks No Fall Risks No Fall Risks No Fall Risks  Follow up Falls evaluation completed;Education provided Falls evaluation completed Falls evaluation completed Falls prevention discussed Falls evaluation completed    MEDICARE RISK AT HOME:  Medicare Risk at Home Any stairs in or around the home?: Yes If so, are there any without handrails?: No Home free of loose throw rugs in walkways, pet beds, electrical cords, etc?: Yes Adequate lighting in your home to reduce risk of falls?: Yes Life alert?: No Use of a cane, walker or w/c?: No Grab bars in the bathroom?: Yes Shower chair or bench in shower?: Yes Elevated toilet seat or a handicapped toilet?: Yes  TIMED UP AND GO:  Was the test performed?  No  Cognitive Function: 6CIT completed         09/21/2024    1:26 PM 09/16/2023    1:49 PM 09/12/2022    1:27 PM 09/07/2021    5:24 PM 01/25/2021   10:41 AM  6CIT Screen  What Year? 0 points 0 points 0 points 0 points 0 points  What month? 0 points 0 points 0 points 0 points 0 points  What time? 0 points 0 points 0 points 0 points 0 points  Count back from 20 0 points 0 points 0 points 0 points 0 points  Months in reverse 0 points 0 points 0 points 0 points 2 points  Repeat phrase 0 points 2 points 2 points 0 points 0 points  Total Score 0 points 2 points 2 points 0 points 2 points    Immunizations Immunization History  Administered Date(s) Administered   Fluad Quad(high Dose 65+) 09/22/2019, 09/14/2020, 09/06/2021   Fluad Trivalent(High Dose 65+) 09/25/2023   INFLUENZA,  HIGH DOSE SEASONAL PF 12/31/2016, 11/03/2017, 12/30/2017, 09/14/2018, 01/01/2019, 12/28/2019, 12/26/2020, 12/28/2021   Influenza,inj,Quad PF,6+ Mos 09/15/2015, 10/15/2016, 12/27/2022, 01/20/2024   Influenza-Unspecified 10/02/2022, 08/31/2024   Moderna SARS-COV2 Booster Vaccination 09/15/2023   Moderna Sars-Covid-2 Vaccination 02/08/2020, 03/07/2020, 12/26/2020, 07/17/2021, 11/23/2021   Pneumococcal Conjugate-13 11/03/2017   Pneumococcal Polysaccharide-23 01/01/2019, 09/22/2019, 12/28/2019, 12/26/2020, 12/28/2021   Td 08/05/2022   Tdap 04/23/2012   Zoster Recombinant(Shingrix) 10/16/2020, 02/06/2021   Zoster, Live 03/16/2014    Screening Tests Health Maintenance  Topic Date Due   COVID-19 Vaccine (7 - 2025-26 season) 08/23/2024   OPHTHALMOLOGY EXAM  10/15/2024   Diabetic kidney evaluation - Urine ACR  02/17/2025   HEMOGLOBIN A1C  02/17/2025   Mammogram  06/17/2025   Colonoscopy  07/08/2025   Diabetic kidney evaluation - eGFR measurement  08/17/2025   FOOT EXAM  08/17/2025   Medicare Annual Wellness (AWV)  09/21/2025   DEXA SCAN  10/07/2028   DTaP/Tdap/Td (3 - Td or Tdap) 08/05/2032   Pneumococcal Vaccine: 50+ Years  Completed   Influenza Vaccine   Completed   Hepatitis C Screening  Completed   Zoster Vaccines- Shingrix  Completed   HPV VACCINES  Aged Out   Meningococcal B Vaccine  Aged Out    Health Maintenance Items Addressed: See Nurse Notes at the end of this note  Additional Screening:  Vision Screening: Recommended annual ophthalmology exams for early detection of glaucoma and other disorders of the eye. Is the patient up to date with their annual eye exam?  Yes  Who is the provider or what is the name of the office in which the patient attends annual eye exams? Dr. Franky Corporal, [place]  Dental Screening: Recommended annual dental exams for proper oral hygiene  Community Resource Referral / Chronic Care Management: CRR required this visit?  No   CCM required this visit?  No   Plan:    I have personally reviewed and noted the following in the patient's chart:   Medical and social history Use of alcohol, tobacco or illicit drugs  Current medications and supplements including opioid prescriptions. Patient is not currently taking opioid prescriptions. Functional ability and status Nutritional status Physical activity Advanced directives List of other physicians Hospitalizations, surgeries, and ER visits in previous 12 months Vitals Screenings to include cognitive, depression, and falls Referrals and appointments  In addition, I have reviewed and discussed with patient certain preventive protocols, quality metrics, and best practice recommendations. A written personalized care plan for preventive services as well as general preventive health recommendations were provided to patient.   Vina Ned, CMA   09/21/2024   After Visit Summary: (MyChart) Due to this being a telephonic visit, the after visit summary with patients personalized plan was offered to patient via MyChart   Notes:  FBS this morning per patient was 107 Declined DM & Nutrition education referral

## 2024-09-30 DIAGNOSIS — J3081 Allergic rhinitis due to animal (cat) (dog) hair and dander: Secondary | ICD-10-CM | POA: Diagnosis not present

## 2024-09-30 DIAGNOSIS — J3089 Other allergic rhinitis: Secondary | ICD-10-CM | POA: Diagnosis not present

## 2024-09-30 DIAGNOSIS — J301 Allergic rhinitis due to pollen: Secondary | ICD-10-CM | POA: Diagnosis not present

## 2024-10-01 NOTE — Progress Notes (Unsigned)
 [facility] [address] [place], [place] [address]  Pulmonary Sleep Medicine   Office Visit Note  Patient Name: Barbara Fitzpatrick DOB: [birth year] MRN [id number]    Chief Complaint: Obstructive Sleep Apnea visit  Brief History:  Barbara Fitzpatrick is seen today for a follow up visit for CPAP@ 7 cmH2O. The patient has a 8 year history of sleep apnea. Patient is using PAP nightly.  The patient feels rested after sleeping with PAP.  The patient reports benefiting from PAP use. Reported sleepiness is  improved and the Epworth Sleepiness Score is 2 out of 24. The patient does not take naps. The patient complains of the following: none.   The compliance download shows 99% compliance with an average use time of 8 hours 49 minutes. The AHI is 0.8.  The patient does complain of limb movements disrupting sleep but these have improved with the gabapentin . She has only tried one capsule, and this is because her primary doctor felt like 200 mg would be too big of a dose for her. The patient continues to require PAP therapy in order to eliminate sleep apnea.   ROS  General: (-) fever, (-) chills, (-) night sweat Nose and Sinuses: (-) nasal stuffiness or itchiness, (-) postnasal drip, (-) nosebleeds, (-) sinus trouble. Mouth and Throat: (-) sore throat, (-) hoarseness. Neck: (-) swollen glands, (-) enlarged thyroid , (-) neck pain. Respiratory: - cough, - shortness of breath, - wheezing. Neurologic: - numbness, - tingling. Psychiatric: + anxiety, - depression   Current Medication: Outpatient Encounter Medications as of 10/04/2024  Medication Sig   ACCU-CHEK GUIDE test strip TEST THREE TIMES DAILY   Accu-Chek Softclix Lancets lancets USE TO CHECK BLOOD SUGAR THREE TIMES DAILY AND DOCUMENT FOR VISITS. BRING LOG TO VISITS   acyclovir  ointment (ZOVIRAX ) 5 % Apply 1 Application topically 3 (three) times daily as needed.   aspirin 81 MG tablet Take 81 mg by mouth daily.   atorvastatin  (LIPITOR) 80 MG  tablet TAKE 1 TABLET(80 MG) BY MOUTH DAILY   benazepril -hydrochlorthiazide (LOTENSIN  HCT) 20-12.5 MG tablet Take 2 tablets by mouth daily.   buPROPion  (WELLBUTRIN  XL) 300 MG 24 hr tablet TAKE 1 TABLET(300 MG) BY MOUTH DAILY   cetirizine (ZYRTEC) 10 MG tablet Take 10 mg by mouth daily.   Cholecalciferol (VITAMIN D -3 PO) Take 3,000 Units by mouth daily.    Coenzyme Q10 (CO Q-10 PO) Take by mouth daily.   cyclobenzaprine  (FLEXERIL ) 10 MG tablet Take 1 tablet (10 mg total) by mouth 3 (three) times daily as needed for muscle spasms.   EPIPEN 2-PAK 0.3 MG/0.3ML SOAJ injection as needed.    ezetimibe  (ZETIA ) 10 MG tablet TAKE 1 TABLET(10 MG) BY MOUTH DAILY   gabapentin  (NEURONTIN ) 100 MG capsule Take one capsule 90 minutes before bedtime, after two weeks increase to two capsules. (Patient taking differently: Take one capsule 90 minutes before bedtime, after two weeks increase to two capsules. Taking 1 capsule daily)   glucosamine-chondroitin 500-400 MG tablet Take 1 tablet by mouth 2 (two) times daily.   glucose blood (ACCU-CHEK GUIDE TEST) test strip 1 each by Other route 3 (three) times daily. Use as instructed   ipratropium (ATROVENT) 0.06 % nasal spray Place into both nostrils.   Magnesium 250 MG TABS Take by mouth daily.    metFORMIN  (GLUCOPHAGE ) 500 MG tablet Take 1 tablet (500 mg total) by mouth 2 (two) times daily with a meal.   Multiple Vitamins-Minerals (MULTIVITAMIN PO) Take by mouth daily.   Olopatadine  HCl 0.6 % SOLN Place 2 sprays into both nostrils 2 (two) times daily.   omeprazole (PRILOSEC) 20 MG capsule Take 20 mg by mouth daily.   valACYclovir  (VALTREX ) 1000 MG tablet Take one tablet twice daily at the first sign of a flare for about 3-5 days   vitamin B-12 (CYANOCOBALAMIN ) 1000 MCG tablet Take 1,000 mcg by mouth daily.   vitamin E 1000 UNIT capsule Take 1,000 Units by mouth daily.   No facility-administered encounter medications on file as of 10/04/2024.    Surgical  History: Past Surgical History:  Procedure Laterality Date   ABDOMINAL HYSTERECTOMY     COLONOSCOPY     hysterectomy     with bladder tack   POLYPECTOMY     TUBAL LIGATION      Medical History: Past Medical History:  Diagnosis Date   Allergy    Arthritis    Depression    Diabetes (HCC)    GERD (gastroesophageal reflux disease)    Hyperlipidemia    Hypertension    Personal history of colonic adenomas 06/16/2013   4 adenomas (max 1 cm)  04/2003  no polyps 04/2005    Sleep apnea    uses c-pap    Family History: Non contributory to the present illness  Social History: Social History   Socioeconomic History   Marital status: Widowed    Spouse name: Not on file   Number of children: 2   Years of education: Not on file   Highest education level: 12th grade  Occupational History   Occupation: retired    Comment: asst. Air traffic controller for [employer]  Tobacco Use   Smoking status: Never   Smokeless tobacco: Never  Vaping Use   Vaping status: Never Used  Substance and Sexual Activity   Alcohol use: No   Drug use: No   Sexual activity: Not Currently  Other Topics Concern   Not on file  Social History Narrative   Widowed, 2 daughters (twins)   Social Drivers of Health   Financial Resource Strain: Low Risk  (09/21/2024)   Overall Financial Resource Strain (CARDIA)    Difficulty of Paying Living Expenses: Not hard at all  Food Insecurity: No Food Insecurity (09/21/2024)   Hunger Vital Sign    Worried About Running Out of Food in the Last Year: Never true    Ran Out of Food in the Last Year: Never true  Transportation Needs: No Transportation Needs (09/21/2024)   PRAPARE - Administrator, Civil Service (Medical): No    Lack of Transportation (Non-Medical): No  Physical Activity: Inactive (09/21/2024)   Exercise Vital Sign    Days of Exercise per Week: 0 days    Minutes of Exercise per Session: 0 min  Stress: No Stress Concern Present (09/21/2024)   [employer] of Occupational Health - Occupational Stress Questionnaire    Feeling of Stress: Not at all  Social Connections: Socially Isolated (09/21/2024)   Social Connection and Isolation Panel    Frequency of Communication with Friends and Family: More than three times a week    Frequency of Social Gatherings with Friends and Family: More than three times a week    Attends Religious Services: Never    Database administrator or Organizations: No    Attends Banker Meetings: Never    Marital Status: Widowed  Intimate Partner Violence: Not At Risk (09/21/2024)   Humiliation, Afraid, Rape, and Kick questionnaire    Fear of Current  or Ex-Partner: No    Emotionally Abused: No    Physically Abused: No    Sexually Abused: No    Vital Signs: Blood pressure 136/84, pulse 77, resp. rate 16, height 5' 4 (1.626 m), weight 197 lb (89.4 kg), SpO2 98%. Body mass index is 33.81 kg/m.    Examination: General Appearance: The patient is well-developed, well-nourished, and in no distress. Neck Circumference: 39 cm Skin: Gross inspection of skin unremarkable. Head: normocephalic, no gross deformities. Eyes: no gross deformities noted. ENT: ears appear grossly normal Neurologic: Alert and oriented. No involuntary movements.  STOP BANG RISK ASSESSMENT S (snore) Have you been told that you snore?     NO   T (tired) Are you often tired, fatigued, or sleepy during the day?   NO  O (obstruction) Do you stop breathing, choke, or gasp during sleep? NO   P (pressure) Do you have or are you being treated for high blood pressure? YES   B (BMI) Is your body index greater than 35 kg/m? YES   A (age) Are you 60 years old or older? YES   N (neck) Do you have a neck circumference greater than 16 inches?   NO   G (gender) Are you a female? NO   TOTAL STOP/BANG "YES" ANSWERS 3       A STOP-Bang score of 2 or less is considered low risk, and a score of 5 or more is high risk for having either  moderate or severe OSA. For people who score 3 or 4, doctors may need to perform further assessment to determine how likely they are to have OSA.         EPWORTH SLEEPINESS SCALE:  Scale:  (0)= no chance of dozing; (1)= slight chance of dozing; (2)= moderate chance of dozing; (3)= high chance of dozing  Chance  Situtation    Sitting and reading: 0    Watching TV: 1    Sitting Inactive in public: 0    As a passenger in car: 0      Lying down to rest: 0    Sitting and talking: 0    Sitting quielty after lunch: 1    In a car, stopped in traffic: 0   TOTAL SCORE:   2 out of 24    SLEEP STUDIES:  PSG (09/2016) AHI 10/hr, REM AHI 58/hr, min SpO2 79% Titration (09/2016) CPAP@ 7 cmH2O, PLMs 75.7   CPAP COMPLIANCE DATA:  Date Range: 10/05/2023-10/03/2024  Average Daily Use: 8 hours 49 minutes  Median Use: 8 hours 52 minutes  Compliance for > 4 Hours: 99%  AHI: 0.8 respiratory events per hour  Days Used: 362/365 days  Mask Leak: 21.6  95th Percentile Pressure: 7         LABS: Recent Results (from the past 2160 hours)  Bayer DCA Hb A1c Waived     Status: Abnormal   Collection Time: 08/17/24 10:26 AM  Result Value Ref Range   HB A1C (BAYER DCA - WAIVED) 5.8 (H) 4.8 - 5.6 %    Comment:          Prediabetes: 5.7 - 6.4          Diabetes: >6.4          Glycemic control for adults with diabetes: <7.0   Comprehensive metabolic panel with GFR     Status: Abnormal   Collection Time: 08/17/24 10:26 AM  Result Value Ref Range   Glucose 104 (H) 70 - 99  mg/dL   BUN 11 8 - 27 mg/dL   Creatinine, Ser 9.22 0.57 - 1.00 mg/dL   eGFR 82 >40 fO/fpw/8.26   BUN/Creatinine Ratio 14 12 - 28   Sodium 142 134 - 144 mmol/L   Potassium 3.7 3.5 - 5.2 mmol/L   Chloride 103 96 - 106 mmol/L   CO2 22 20 - 29 mmol/L   Calcium  9.8 8.7 - 10.3 mg/dL   Total Protein 5.9 (L) 6.0 - 8.5 g/dL   Albumin 4.1 3.8 - 4.8 g/dL   Globulin, Total 1.8 1.5 - 4.5 g/dL   Bilirubin Total 0.4 0.0  - 1.2 mg/dL   Alkaline Phosphatase 80 44 - 121 IU/L   AST 17 0 - 40 IU/L   ALT 24 0 - 32 IU/L  Lipid Panel w/o Chol/HDL Ratio     Status: Abnormal   Collection Time: 08/17/24 10:26 AM  Result Value Ref Range   Cholesterol, Total 101 100 - 199 mg/dL   Triglycerides 880 0 - 149 mg/dL   HDL 38 (L) >60 mg/dL   VLDL Cholesterol Cal 22 5 - 40 mg/dL   LDL Chol Calc (NIH) 41 0 - 99 mg/dL  Iron     Status: None   Collection Time: 08/17/24 10:26 AM  Result Value Ref Range   Iron 54 27 - 139 ug/dL  Ferritin     Status: None   Collection Time: 08/17/24 10:26 AM  Result Value Ref Range   Ferritin 67 15 - 150 ng/mL    Radiology: MM 3D SCREENING MAMMOGRAM BILATERAL BREAST Result Date: 06/21/2024 CLINICAL DATA:  Screening. EXAM: DIGITAL SCREENING BILATERAL MAMMOGRAM WITH TOMOSYNTHESIS AND CAD TECHNIQUE: Bilateral screening digital craniocaudal and mediolateral oblique mammograms were obtained. Bilateral screening digital breast tomosynthesis was performed. The images were evaluated with computer-aided detection. COMPARISON:  Previous exam(s). ACR Breast Density Category b: There are scattered areas of fibroglandular density. FINDINGS: There are no findings suspicious for malignancy. IMPRESSION: No mammographic evidence of malignancy. A result letter of this screening mammogram will be mailed directly to the patient. RECOMMENDATION: Screening mammogram in one year. (Code:SM-B-01Y) BI-RADS CATEGORY  1: Negative. Electronically Signed   By: Dina  Arceo M.D.   On: 06/21/2024 08:53    No results found.  No results found.    Assessment and Plan: Patient Active Problem List   Diagnosis Date Noted   Diabetes mellitus treated with oral medication (HCC) 02/15/2024   OSA on CPAP 08/13/2021   Bilateral carotid bruits 01/11/2020   Osteopenia of spine 07/23/2018   Depression, major, single episode, severe (HCC) 06/17/2018   GERD (gastroesophageal reflux disease) 11/03/2017   Advanced care  planning/counseling discussion 11/03/2017   Obesity 03/13/2016   Type 2 diabetes mellitus with obesity 09/15/2015   Hypertension associated with type 2 diabetes mellitus (HCC) 09/15/2015   Hyperlipidemia associated with type 2 diabetes mellitus (HCC) 09/15/2015   Primary insomnia 09/15/2015   1. OSA on CPAP (Primary) The patient does tolerate PAP and reports  benefit from PAP use. The patient was reminded how to clean equipment and advised to replace supplies routinely. The patient was also counselled on weight loss. The compliance is excellent. The AHI is 0.8.   OSA on cpap- controlled. Continue with excellent compliance with pap. CPAP continues to be medically necessary to treat this patient's OSA. F/u one year.    2. CPAP use counseling CPAP Counseling: had a lengthy discussion with the patient regarding the importance of PAP therapy in management of the sleep apnea.  Patient appears to understand the risk factor reduction and also understands the risks associated with untreated sleep apnea. Patient will try to make a good faith effort to remain compliant with therapy. Also instructed the patient on proper cleaning of the device including the water must be changed daily if possible and use of distilled water is preferred. Patient understands that the machine should be regularly cleaned with appropriate recommended cleaning solutions that do not damage the PAP machine for example given white vinegar and water rinses. Other methods such as ozone treatment may not be as good as these simple methods to achieve cleaning.   3. Restless leg syndrome Improved with low dose gabapentin . Continue.      General Counseling: I have discussed the findings of the evaluation and examination with Barbara Fitzpatrick.  I have also discussed any further diagnostic evaluation thatmay be needed or ordered today. Barbara Fitzpatrick verbalizes understanding of the findings of todays visit. We also reviewed her medications today and discussed  drug interactions and side effects including but not limited excessive drowsiness and altered mental states. We also discussed that there is always a risk not just to her but also people around her. she has been encouraged to call the office with any questions or concerns that should arise related to todays visit.  No orders of the defined types were placed in this encounter.       I have personally obtained a history, examined the patient, evaluated laboratory and imaging results, formulated the assessment and plan and placed orders. This patient was seen today by Lauraine Lay, PA-C in collaboration with Dr. Elfreda Bathe.   Barbara Fitzpatrick DELENA Bathe, MD [facility] Diplomate ABMS Pulmonary Critical Care Medicine and Sleep Medicine

## 2024-10-04 ENCOUNTER — Ambulatory Visit (INDEPENDENT_AMBULATORY_CARE_PROVIDER_SITE_OTHER): Admitting: Internal Medicine

## 2024-10-04 VITALS — BP 136/84 | HR 77 | Resp 16 | Ht 64.0 in | Wt 197.0 lb

## 2024-10-04 DIAGNOSIS — G2581 Restless legs syndrome: Secondary | ICD-10-CM | POA: Diagnosis not present

## 2024-10-04 DIAGNOSIS — G4733 Obstructive sleep apnea (adult) (pediatric): Secondary | ICD-10-CM

## 2024-10-04 DIAGNOSIS — Z7189 Other specified counseling: Secondary | ICD-10-CM | POA: Insufficient documentation

## 2024-10-04 NOTE — Patient Instructions (Signed)

## 2024-10-07 DIAGNOSIS — J301 Allergic rhinitis due to pollen: Secondary | ICD-10-CM | POA: Diagnosis not present

## 2024-10-07 DIAGNOSIS — J3081 Allergic rhinitis due to animal (cat) (dog) hair and dander: Secondary | ICD-10-CM | POA: Diagnosis not present

## 2024-10-07 DIAGNOSIS — J3089 Other allergic rhinitis: Secondary | ICD-10-CM | POA: Diagnosis not present

## 2024-10-14 NOTE — Progress Notes (Signed)
 Barbara Fitzpatrick                                          MRN: [id number]   10/14/2024   The [facility] Quality Team Specialist reviewed this patient medical record for the purposes of chart review for care gap closure. The following were reviewed: chart review for care gap closure-diabetic eye exam.    [facility] Quality Team

## 2024-10-15 DIAGNOSIS — J3089 Other allergic rhinitis: Secondary | ICD-10-CM | POA: Diagnosis not present

## 2024-10-15 DIAGNOSIS — J301 Allergic rhinitis due to pollen: Secondary | ICD-10-CM | POA: Diagnosis not present

## 2024-10-19 DIAGNOSIS — H353121 Nonexudative age-related macular degeneration, left eye, early dry stage: Secondary | ICD-10-CM | POA: Diagnosis not present

## 2024-10-19 DIAGNOSIS — E119 Type 2 diabetes mellitus without complications: Secondary | ICD-10-CM | POA: Diagnosis not present

## 2024-10-19 DIAGNOSIS — H524 Presbyopia: Secondary | ICD-10-CM | POA: Diagnosis not present

## 2024-10-19 DIAGNOSIS — H43813 Vitreous degeneration, bilateral: Secondary | ICD-10-CM | POA: Diagnosis not present

## 2024-10-23 LAB — OPHTHALMOLOGY REPORT-SCANNED

## 2024-11-12 DIAGNOSIS — J301 Allergic rhinitis due to pollen: Secondary | ICD-10-CM | POA: Diagnosis not present

## 2024-11-12 DIAGNOSIS — J3081 Allergic rhinitis due to animal (cat) (dog) hair and dander: Secondary | ICD-10-CM | POA: Diagnosis not present

## 2024-11-12 DIAGNOSIS — J3089 Other allergic rhinitis: Secondary | ICD-10-CM | POA: Diagnosis not present

## 2024-11-25 DIAGNOSIS — J301 Allergic rhinitis due to pollen: Secondary | ICD-10-CM | POA: Diagnosis not present

## 2024-11-25 DIAGNOSIS — J3089 Other allergic rhinitis: Secondary | ICD-10-CM | POA: Diagnosis not present

## 2024-11-25 DIAGNOSIS — J3081 Allergic rhinitis due to animal (cat) (dog) hair and dander: Secondary | ICD-10-CM | POA: Diagnosis not present

## 2025-01-08 ENCOUNTER — Encounter: Payer: Self-pay | Admitting: Nurse Practitioner

## 2025-01-10 ENCOUNTER — Encounter: Payer: Self-pay | Admitting: Nurse Practitioner

## 2025-01-10 ENCOUNTER — Ambulatory Visit: Payer: Self-pay

## 2025-01-10 ENCOUNTER — Ambulatory Visit: Admitting: Nurse Practitioner

## 2025-01-10 VITALS — BP 122/72 | HR 94 | Temp 97.9°F | Resp 17 | Ht 64.02 in | Wt 193.4 lb

## 2025-01-10 DIAGNOSIS — J029 Acute pharyngitis, unspecified: Secondary | ICD-10-CM

## 2025-01-10 DIAGNOSIS — J069 Acute upper respiratory infection, unspecified: Secondary | ICD-10-CM | POA: Diagnosis not present

## 2025-01-10 LAB — POC COVID19 BINAXNOW: SARS Coronavirus 2 Ag: NEGATIVE

## 2025-01-10 MED ORDER — BENZONATATE 100 MG PO CAPS: 100.0000 mg | ORAL_CAPSULE | Freq: Two times a day (BID) | ORAL | 0 refills | Status: AC | PRN

## 2025-01-10 MED ORDER — LIDOCAINE VISCOUS HCL 2 % MT SOLN: 15.0000 mL | OROMUCOSAL | 0 refills | Status: DC | PRN

## 2025-01-10 NOTE — Assessment & Plan Note (Signed)
 Acute, Covid and Flu negative in the office. Discussed with her that viruses often run their course in 5-7 days and abx therapy is not recommended during that time. If symptoms are ongoing on Friday she is to reach out to office and will send abx in then since snow may be present this weekend and may not be able to leave home. Tessalon  and Viscous Lidocaine  sent in to use as needed. Educated on this. Recommend: - Increased rest - Increasing Fluids - Acetaminophen / ibuprofen as needed for fever/pain.  - Salt water gargling, chloraseptic spray and throat lozenges - OTC Coricidin - Mucinex .  - Saline sinus flushes or a neti pot.  - Humidifying the air.

## 2025-01-10 NOTE — Progress Notes (Signed)
 "  BP 122/72 (BP Location: Left Arm, Patient Position: Sitting, Cuff Size: Large)   Pulse 94   Temp 97.9 F (36.6 C) (Oral)   Resp 17   Ht 5' 4.02 (1.626 m)   Wt 193 lb 6.4 oz (87.7 kg)   LMP  (LMP Unknown)   SpO2 94%   BMI 33.18 kg/m    Subjective:    Patient ID: Barbara Fitzpatrick, female    DOB: 03/22/1951, 74 y.o.   MRN: [id number]  HPI: Barbara Fitzpatrick is a 74 y.o. female  Chief Complaint  Patient presents with   Sinus Problem    Sinus pressure    Fever    Started Friday; all symptoms started on Friday   Cough    Cough feels congestion   UPPER RESPIRATORY TRACT INFECTION Started with symptoms on Friday, tested flu and Covid at home. Was negative for both. Fever: yes  Cough: yes Shortness of breath: no Wheezing: no Chest pain: no Chest tightness: yes Chest congestion: yes Nasal congestion: yes Runny nose: yes Post nasal drip: yes Sneezing: no Sore throat: yes started this morning Swollen glands: no Sinus pressure: yes initially Headache: gone away Face pain: no Toothache: no Ear pain: none Ear pressure: none Eyes red/itching:no Eye drainage/crusting: no  Vomiting: no Rash: no Fatigue: yes Sick contacts: no Strep contacts: no  Context: fluctuating Recurrent sinusitis: no Relief with OTC cold/cough medications: no  Treatments attempted: Advil and Halls    Relevant past medical, surgical, family and social history reviewed and updated as indicated. Interim medical history since our last visit reviewed. Allergies and medications reviewed and updated.  Review of Systems  Constitutional:  Positive for fatigue and fever. Negative for activity change, appetite change and chills.  HENT:  Positive for congestion, postnasal drip, rhinorrhea and sore throat. Negative for ear discharge, ear pain, facial swelling, sinus pressure (improved), sinus pain and voice change.   Eyes:  Negative for pain and visual disturbance.  Respiratory:  Positive for cough and  chest tightness. Negative for shortness of breath and wheezing.   Cardiovascular:  Negative for chest pain, palpitations and leg swelling.  Gastrointestinal: Negative.   Neurological:  Negative for dizziness, numbness and headaches (improved).  Psychiatric/Behavioral: Negative.     Per HPI unless specifically indicated above     Objective:    BP 122/72 (BP Location: Left Arm, Patient Position: Sitting, Cuff Size: Large)   Pulse 94   Temp 97.9 F (36.6 C) (Oral)   Resp 17   Ht 5' 4.02 (1.626 m)   Wt 193 lb 6.4 oz (87.7 kg)   LMP  (LMP Unknown)   SpO2 94%   BMI 33.18 kg/m   Wt Readings from Last 3 Encounters:  01/10/25 193 lb 6.4 oz (87.7 kg)  10/04/24 197 lb (89.4 kg)  09/21/24 196 lb (88.9 kg)    Physical Exam Vitals and nursing note reviewed.  Constitutional:      General: She is awake. She is not in acute distress.    Appearance: She is well-developed and well-groomed. She is obese. She is ill-appearing. She is not toxic-appearing.  HENT:     Head: Normocephalic.     Right Ear: Hearing, ear canal and external ear normal. A middle ear effusion is present. Tympanic membrane is not injected or perforated.     Left Ear: Hearing, ear canal and external ear normal. A middle ear effusion is present. Tympanic membrane is not injected or perforated.     Nose:  Rhinorrhea present. Rhinorrhea is clear.     Right Sinus: No maxillary sinus tenderness or frontal sinus tenderness.     Left Sinus: No maxillary sinus tenderness or frontal sinus tenderness.     Mouth/Throat:     Mouth: Mucous membranes are moist.     Pharynx: Posterior oropharyngeal erythema (mild) and postnasal drip present. No pharyngeal swelling or oropharyngeal exudate.  Eyes:     General: Lids are normal.        Right eye: No discharge.        Left eye: No discharge.     Conjunctiva/sclera: Conjunctivae normal.     Pupils: Pupils are equal, round, and reactive to light.  Neck:     Thyroid : No thyromegaly.      Vascular: No carotid bruit.  Cardiovascular:     Rate and Rhythm: Normal rate and regular rhythm.     Heart sounds: Normal heart sounds. No murmur heard.    No gallop.  Pulmonary:     Effort: Pulmonary effort is normal. No accessory muscle usage or respiratory distress.     Breath sounds: Normal breath sounds. No decreased breath sounds, wheezing or rales.  Abdominal:     General: Bowel sounds are normal.     Palpations: Abdomen is soft. There is no hepatomegaly or splenomegaly.  Musculoskeletal:     Cervical back: Normal range of motion and neck supple.     Right lower leg: No edema.     Left lower leg: No edema.  Lymphadenopathy:     Head:     Right side of head: No submental, submandibular, tonsillar, preauricular or posterior auricular adenopathy.     Left side of head: No submental, submandibular, tonsillar, preauricular or posterior auricular adenopathy.     Cervical: No cervical adenopathy.  Skin:    General: Skin is warm and dry.  Neurological:     Mental Status: She is alert and oriented to person, place, and time.  Psychiatric:        Attention and Perception: Attention normal.        Mood and Affect: Mood normal.        Speech: Speech normal.        Behavior: Behavior normal. Behavior is cooperative.        Thought Content: Thought content normal.     Results for orders placed or performed in visit on 10/29/24  OPHTHALMOLOGY REPORT-SCANNED   Collection Time: 10/23/24 10:40 AM  Result Value Ref Range   HM Diabetic Eye Exam No Retinopathy No Retinopathy   A Comment        Assessment & Plan:   Problem List Items Addressed This Visit       Respiratory   Upper respiratory tract infection - Primary   Acute, Covid and Flu negative in the office. Discussed with her that viruses often run their course in 5-7 days and abx therapy is not recommended during that time. If symptoms are ongoing on Friday she is to reach out to office and will send abx in then since snow  may be present this weekend and may not be able to leave home. Tessalon  and Viscous Lidocaine  sent in to use as needed. Educated on this. Recommend: - Increased rest - Increasing Fluids - Acetaminophen / ibuprofen as needed for fever/pain.  - Salt water gargling, chloraseptic spray and throat lozenges - OTC Coricidin - Mucinex .  - Saline sinus flushes or a neti pot.  - Humidifying the air.  Relevant Orders   Influenza A & B (STAT)   POC COVID-19     Follow up plan: Return if symptoms worsen or fail to improve.      "

## 2025-01-10 NOTE — Patient Instructions (Signed)

## 2025-01-10 NOTE — Telephone Encounter (Signed)
 FYI Only or Action Required?: FYI only for provider: appointment scheduled on 01/10/2025 outside dispo.  Patient was last seen in primary care on 08/17/2024 by Valerio Melanie DASEN, NP.  Called Nurse Triage reporting Cough.  Symptoms began February 07, 2025 .  Interventions attempted: OTC medications: advil for fever.  Symptoms are: stable.  Triage Disposition: Discuss With PCP and Callback by Nurse Today (overriding See HCP Within 4 Hours (Or PCP Triage))  Patient/caregiver understands and will follow disposition?: Yes    Reason for Disposition  [1] Fever > 100 F (37.8 C) AND [2] diabetes mellitus or weak immune system (e.g., HIV positive, cancer chemo, splenectomy, organ transplant, chronic steroids)  Answer Assessment - Initial Assessment Questions Patient reports symptoms started 2025/02/07 pressure in head around tops of eyes and ears. Last couple days hasn't had head pressure past few days.   Running  fever 99-100F.  Flu /cvoid neg at home test. Cough in chest not as terrible but sore throat this morning. Runny nose clear. Nausea Feb 07, 2025. Brother passed away today. Saw brother Friday. Family as support system. Advil for the fever and cough drops . Type II diabetes checks sugars last night . Has video visit  3PM scheduled not able tod video and needs in person visit. Requesting to see PCP.  this RN called [facility] and asked if the 3PM could be switched to a in person visit this was approved and visit changed patient advised  in person visit at [facility] today . ER precautions reviewed with patient     1. ONSET: When did the cough begin?      07-Feb-2025  2. SEVERITY: How bad is the cough today?      Severe  cough 3. SPUTUM: Describe the color of your sputum (e.g., none, dry cough; clear, white, yellow, green)     None its dry  4. HEMOPTYSIS: Are you coughing up any blood? If Yes, ask: How much? (e.g., flecks, streaks, tablespoons, etc.)     Denies  5. DIFFICULTY BREATHING: Are you having  difficulty breathing? If Yes, ask: How bad is it? (e.g., mild, moderate, severe)      Denies  6. FEVER: Do you have a fever? If Yes, ask: What is your temperature, how was it measured, and when did it start?     99-100 F 7. CARDIAC HISTORY: Do you have any history of heart disease? (e.g., heart attack, congestive heart failure)      Hypertension per chart review  8. LUNG HISTORY: Do you have any history of lung disease?  (e.g., pulmonary embolus, asthma, emphysema)     Not reported by patient   10. OTHER SYMPTOMS: Do you have any other symptoms? (e.g., runny nose, wheezing, chest pain)       Runny nose   Patient denies the following chest  pain, shortness of breath, vomiting, wheezing  Protocols used: Cough - Acute Non-Productive-A-AH   Message from [place] C sent at 01/10/2025  8:47 AM EST  Reason for Triage: Patients brother passed away this morning, and patient states she is sick and needs to see someone or something called in so she can be around family. Patient has fever,really painful headache and pressure, and cough.    Call History  Contact Date/Time Type Contact Phone/Fax By  01/10/2025 08:41 AM EST Phone (Incoming) Dany, Harten (Self) [phone] MIKE) [place] Roselie POUR

## 2025-01-11 ENCOUNTER — Telehealth: Payer: Self-pay

## 2025-01-11 LAB — VERITOR FLU A/B WAIVED
Influenza A: NEGATIVE
Influenza B: NEGATIVE

## 2025-01-11 MED ORDER — LIDOCAINE VISCOUS HCL 2 % MT SOLN: 15.0000 mL | Freq: Four times a day (QID) | OROMUCOSAL | 0 refills | Status: AC | PRN

## 2025-01-11 NOTE — Telephone Encounter (Signed)
 Pharmacy sent a fax regarding the Lidocaine  prescription. Pharmacy is requesting to have more specific directions on the script and resent to them. Requesting the frequency of how the patient will be using the medication and the day supply limitations.

## 2025-01-11 NOTE — Addendum Note (Signed)
 Addended by: Deborah Lazcano T on: 01/11/2025 10:38 AM   Modules accepted: Orders

## 2025-02-22 ENCOUNTER — Encounter: Admitting: Nurse Practitioner

## 2025-09-27 ENCOUNTER — Ambulatory Visit
